# Patient Record
Sex: Female | Born: 1935 | Race: White | Hispanic: No | Smoking: Never smoker
Health system: Southern US, Community
[De-identification: ages and names within clinical notes are randomized; demographics above are authoritative.]

## PROBLEM LIST (undated history)

## (undated) DIAGNOSIS — R569 Unspecified convulsions: Secondary | ICD-10-CM

## (undated) DIAGNOSIS — J45909 Unspecified asthma, uncomplicated: Secondary | ICD-10-CM

## (undated) DIAGNOSIS — I639 Cerebral infarction, unspecified: Secondary | ICD-10-CM

## (undated) DIAGNOSIS — E119 Type 2 diabetes mellitus without complications: Secondary | ICD-10-CM

## (undated) HISTORY — PX: ABDOMINAL HYSTERECTOMY: SHX81

## (undated) HISTORY — PX: OTHER SURGICAL HISTORY: SHX169

## (undated) HISTORY — DX: Unspecified convulsions: R56.9

## (undated) HISTORY — PX: APPENDECTOMY: SHX54

## (undated) HISTORY — PX: TONSILLECTOMY: SUR1361

---

## 2017-06-26 DIAGNOSIS — I2699 Other pulmonary embolism without acute cor pulmonale: Secondary | ICD-10-CM

## 2017-06-26 HISTORY — DX: Other pulmonary embolism without acute cor pulmonale: I26.99

## 2017-07-26 DIAGNOSIS — N200 Calculus of kidney: Secondary | ICD-10-CM

## 2017-07-26 HISTORY — DX: Calculus of kidney: N20.0

## 2019-02-04 ENCOUNTER — Emergency Department (HOSPITAL_COMMUNITY): Payer: Medicare Other

## 2019-02-04 ENCOUNTER — Other Ambulatory Visit: Payer: Self-pay

## 2019-02-04 ENCOUNTER — Emergency Department (HOSPITAL_COMMUNITY)
Admission: EM | Admit: 2019-02-04 | Discharge: 2019-02-04 | Disposition: A | Discharge status: Home / Self Care | Payer: Medicare Other | Attending: Emergency Medicine | Admitting: Emergency Medicine

## 2019-02-04 ENCOUNTER — Encounter (HOSPITAL_COMMUNITY): Payer: Self-pay | Admitting: *Deleted

## 2019-02-04 DIAGNOSIS — Y929 Unspecified place or not applicable: Secondary | ICD-10-CM | POA: Insufficient documentation

## 2019-02-04 DIAGNOSIS — W0110XA Fall on same level from slipping, tripping and stumbling with subsequent striking against unspecified object, initial encounter: Secondary | ICD-10-CM | POA: Diagnosis not present

## 2019-02-04 DIAGNOSIS — R519 Headache, unspecified: Secondary | ICD-10-CM | POA: Insufficient documentation

## 2019-02-04 DIAGNOSIS — W19XXXA Unspecified fall, initial encounter: Secondary | ICD-10-CM

## 2019-02-04 DIAGNOSIS — E119 Type 2 diabetes mellitus without complications: Secondary | ICD-10-CM | POA: Diagnosis not present

## 2019-02-04 DIAGNOSIS — Y939 Activity, unspecified: Secondary | ICD-10-CM | POA: Diagnosis not present

## 2019-02-04 DIAGNOSIS — Y999 Unspecified external cause status: Secondary | ICD-10-CM | POA: Diagnosis not present

## 2019-02-04 DIAGNOSIS — J45909 Unspecified asthma, uncomplicated: Secondary | ICD-10-CM | POA: Insufficient documentation

## 2019-02-04 HISTORY — DX: Type 2 diabetes mellitus without complications: E11.9

## 2019-02-04 HISTORY — DX: Cerebral infarction, unspecified: I63.9

## 2019-02-04 HISTORY — DX: Unspecified asthma, uncomplicated: J45.909

## 2019-02-04 NOTE — ED Triage Notes (Addendum)
Pt fell this morning while sitting on toilet and hit head on the floor. Pt with hx of CVA and has balance issues. Pt with bruising to right eye and right forehead.

## 2019-02-04 NOTE — ED Provider Notes (Signed)
[facility] Provider Note   CSN: [id number] Arrival date & time: 02/04/19  1220     History Chief Complaint  Patient presents with  . Fall    Amberia Bayless is a 86 y.o. female.  Patient states that she tripped and fell and hit her head no loss of consciousness.  Patient is not taking any blood thinners.  Patient also hit her right knee but she says she has no pain there.  The history is provided by the patient. No language interpreter was used.  Fall This is a new problem. The current episode started 12 to 24 hours ago. The problem occurs rarely. The problem has been resolved. Pertinent negatives include no chest pain, no abdominal pain and no headaches. Nothing aggravates the symptoms. Nothing relieves the symptoms.       Past Medical History:  Diagnosis Date  . Asthma   . Diabetes mellitus without complication ([place])   . Stroke [facility])     There are no problems to display for this patient.   Past Surgical History:  Procedure Laterality Date  . ABDOMINAL HYSTERECTOMY    . APPENDECTOMY    . thumb surgery    . TONSILLECTOMY       OB History   No obstetric history on file.     History reviewed. No pertinent family history.  Social History   Tobacco Use  . Smoking status: Never Smoker  . Smokeless tobacco: Never Used  Substance Use Topics  . Alcohol use: Not Currently  . Drug use: Not Currently    Home Medications Prior to Admission medications   Not on File    Allergies    Asa [aspirin], Lisinopril, Molds & smuts, and Penicillins  Review of Systems   Review of Systems  Constitutional: Negative for appetite change and fatigue.  HENT: Negative for congestion, ear discharge and sinus pressure.        Headache  Eyes: Negative for discharge.  Respiratory: Negative for cough.   Cardiovascular: Negative for chest pain.  Gastrointestinal: Negative for abdominal pain and diarrhea.  Genitourinary: Negative for frequency and  hematuria.  Musculoskeletal: Negative for back pain.  Skin: Negative for rash.  Neurological: Negative for seizures and headaches.  Psychiatric/Behavioral: Negative for hallucinations.    Physical Exam Updated Vital Signs BP (!) 143/86   Pulse 71   Temp 98.5 F (36.9 C) (Oral)   Resp 18   Ht 5' 5.5" (1.664 m)   Wt 84.4 kg   SpO2 96%   BMI 30.48 kg/m   Physical Exam Vitals and nursing note reviewed.  Constitutional:      Appearance: She is well-developed.  HENT:     Head: Normocephalic.     Comments: Bruising around right orbit.    Nose: Nose normal.  Eyes:     General: No scleral icterus.    Conjunctiva/sclera: Conjunctivae normal.  Neck:     Thyroid: No thyromegaly.  Cardiovascular:     Rate and Rhythm: Normal rate and regular rhythm.     Heart sounds: No murmur. No friction rub. No gallop.   Pulmonary:     Breath sounds: No stridor. No wheezing or rales.  Chest:     Chest wall: No tenderness.  Abdominal:     General: There is no distension.     Tenderness: There is no abdominal tenderness. There is no rebound.  Musculoskeletal:        General: Normal range of motion.     Cervical  back: Neck supple.  Lymphadenopathy:     Cervical: No cervical adenopathy.  Skin:    Findings: No erythema or rash.  Neurological:     Mental Status: She is alert and oriented to person, place, and time.     Motor: No abnormal muscle tone.     Coordination: Coordination normal.  Psychiatric:        Behavior: Behavior normal.     ED Results / Procedures / Treatments   Labs (all labs ordered are listed, but only abnormal results are displayed) Labs Reviewed - No data to display  EKG None  Radiology CT Head Wo Contrast  Result Date: 02/04/2019 CLINICAL DATA:  Fall this morning with head injury on floor. EXAM: CT HEAD WITHOUT CONTRAST TECHNIQUE: Contiguous axial images were obtained from the base of the skull through the vertex without intravenous contrast. COMPARISON:   None. FINDINGS: Brain: Mild symmetric prominence of CSF space at the vertex with low attenuation, nonspecific, favor normal CSF due to cerebral atrophy. No evidence of parenchymal hemorrhage or definite extra-axial fluid collection. No mass lesion, mass effect, or midline shift. No CT evidence of acute infarction. Generalized cerebral volume loss. No ventriculomegaly. Vascular: No acute abnormality. Skull: No evidence of calvarial fracture. Large right frontal scalp hematoma. Sinuses/Orbits: The visualized paranasal sinuses are essentially clear. Other:  The mastoid air cells are unopacified. IMPRESSION: 1. Large right frontal scalp hematoma. No evidence of calvarial fracture. 2. No definite acute intracranial abnormality. Nonspecific mild symmetric low-attenuation prominence of the CSF space at the vertex, favor normal CSF due to cerebral atrophy, cannot entirely exclude small symmetric low-attenuation subdural collections/hygromas. Consider short-term follow-up head CT to document stability. Electronically Signed   By: Delbert Phenix M.D.   On: 02/04/2019 13:41    Procedures Procedures (including critical care time)  Medications Ordered in ED Medications - No data to display  ED Course  I have reviewed the triage vital signs and the nursing notes.  Pertinent labs & imaging results that were available during my care of the patient were reviewed by me and considered in my medical decision making (see chart for details).    MDM Rules/Calculators/A&P                      CT scan shows no fractures or bleeding inside head.  Patient with a large contusion to right forehead.  She will take Tylenol for pain and follow-up as needed Final Clinical Impression(s) / ED Diagnoses Final diagnoses:  Fall, initial encounter    Rx / DC Orders ED Discharge Orders    None       Bethann Berkshire, MD 02/04/19 1610

## 2019-02-04 NOTE — ED Notes (Signed)
Pt bruising and swelling to right eye/forehead worse.  Right eye swollen shut at this time.

## 2019-02-04 NOTE — Discharge Instructions (Signed)
Take tylenol for pain and follow with your md next week

## 2019-11-30 ENCOUNTER — Ambulatory Visit: Payer: Medicare Other | Admitting: Family Medicine

## 2019-12-27 ENCOUNTER — Other Ambulatory Visit: Payer: Self-pay

## 2019-12-27 ENCOUNTER — Ambulatory Visit (INDEPENDENT_AMBULATORY_CARE_PROVIDER_SITE_OTHER): Payer: Medicare Other | Admitting: Family Medicine

## 2019-12-27 ENCOUNTER — Encounter: Payer: Self-pay | Admitting: Family Medicine

## 2019-12-27 VITALS — BP 162/80 | HR 96 | Temp 97.2°F | Ht 66.0 in | Wt 202.0 lb

## 2019-12-27 DIAGNOSIS — I6930 Unspecified sequelae of cerebral infarction: Secondary | ICD-10-CM

## 2019-12-27 DIAGNOSIS — F320 Major depressive disorder, single episode, mild: Secondary | ICD-10-CM | POA: Diagnosis not present

## 2019-12-27 DIAGNOSIS — E1165 Type 2 diabetes mellitus with hyperglycemia: Secondary | ICD-10-CM

## 2019-12-27 DIAGNOSIS — B372 Candidiasis of skin and nail: Secondary | ICD-10-CM

## 2019-12-27 DIAGNOSIS — G40909 Epilepsy, unspecified, not intractable, without status epilepticus: Secondary | ICD-10-CM

## 2019-12-27 DIAGNOSIS — R413 Other amnesia: Secondary | ICD-10-CM

## 2019-12-27 DIAGNOSIS — I10 Essential (primary) hypertension: Secondary | ICD-10-CM

## 2019-12-27 DIAGNOSIS — N3281 Overactive bladder: Secondary | ICD-10-CM

## 2019-12-27 DIAGNOSIS — E785 Hyperlipidemia, unspecified: Secondary | ICD-10-CM

## 2019-12-27 MED ORDER — METFORMIN HCL 1000 MG PO TABS: 1000.0000 mg | ORAL_TABLET | Freq: Two times a day (BID) | ORAL | 1 refills | Status: DC

## 2019-12-27 MED ORDER — SOLIFENACIN SUCCINATE 5 MG PO TABS: 5.0000 mg | ORAL_TABLET | Freq: Every day | ORAL | 1 refills | Status: DC

## 2019-12-27 MED ORDER — SIMVASTATIN 5 MG PO TABS: 5.0000 mg | ORAL_TABLET | Freq: Every day | ORAL | 1 refills | Status: DC

## 2019-12-27 MED ORDER — LEVETIRACETAM 750 MG PO TABS: 750.0000 mg | ORAL_TABLET | Freq: Two times a day (BID) | ORAL | 1 refills | Status: DC

## 2019-12-27 MED ORDER — NYSTATIN 100000 UNIT/GM EX POWD: 1.0000 "application " | Freq: Three times a day (TID) | CUTANEOUS | 1 refills | Status: DC

## 2019-12-27 MED ORDER — AMLODIPINE BESYLATE 5 MG PO TABS: 5.0000 mg | ORAL_TABLET | Freq: Every day | ORAL | 1 refills | Status: DC

## 2019-12-27 MED ORDER — GABAPENTIN 300 MG PO CAPS: 300.0000 mg | ORAL_CAPSULE | Freq: Every day | ORAL | 1 refills | Status: DC

## 2019-12-27 MED ORDER — DULOXETINE HCL 20 MG PO CPEP: 20.0000 mg | ORAL_CAPSULE | Freq: Every day | ORAL | 1 refills | Status: DC

## 2019-12-27 MED ORDER — GLIPIZIDE 10 MG PO TABS: 10.0000 mg | ORAL_TABLET | Freq: Two times a day (BID) | ORAL | 1 refills | Status: DC

## 2019-12-27 NOTE — Patient Instructions (Addendum)
I appreciate the opportunity to provide you with care for your health and wellness. Today we discussed: establish care  Follow up: 3 months in office  No labs or referrals today  Sign release form for records  Nice to meet you both today.  Please continue to practice social distancing to keep you, your family, and our community safe.  If you must go out, please wear a mask and practice good handwashing.  It was a pleasure to see you and I look forward to continuing to work together on your health and well-being. Please do not hesitate to call the office if you need care or have questions about your care.  Have a wonderful day. With Gratitude, Tereasa Coop, DNP, AGNP-BC

## 2019-12-27 NOTE — Progress Notes (Signed)
Subjective:  Patient ID: Anna Robbins, female    DOB: 06/13/1932  Age: 86 y.o. MRN: [id number]  CC:  Chief Complaint  Patient presents with  . New Patient (Initial Visit)    rash underneath both breasts x1 week, needs med refills      HPI  HPI Anna Robbins is an 86 year old female patient who presents today to establish care.  She has an extensive history of recent which includes but is not limited to asthma, type 2 diabetes, seizures post having stroke, overactive bladder, depression, hypertension.  She presents today with her daughter-in-law who helps her with her care.  [place]ccre[place]Barbara [place] [facility].  After suffering a hemorrhagic stroke in October 2020.  She now lives with her daughter-in-law, her son and his wife and grandchildren.  They have a few pets in the home as well.  The downstairs main level has kind of been really renovated so that she has almost like a hotel like room and has all of the things that she needs.  She does report still feeling out of sorts not having everything that she is used to having.  But she is adjusting and she is happy here.  In [place] she was living with a son that she was very close with who ended up passing away.  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  Originally born in [place] a [place] age moved to [place] then back to [place] and then lived in [place] and then moved to [place] and then up to [place].  Her daughter-in-law has a written note that she presents prior to the appointment about concerning behavior that they seen over the last year since she is moved in.  Some of the things include forgetting things that were told to her that are very important that involve her within the same conversation.  Her getting things told her from the day before.  Forgetting plans that she makes and thinks that she decides either the same  day or next day.  Sometimes she will make up stories or situations that does happen.  Reinventing her passing significantly changing stories that are well-known to all her could not happen.  Sometimes there is general confusion.  Insisting that her version of events are the truth and gets angry when they tell her otherwise.  She gives great detail as to her back story.  In her life in [place].  Asthma: Limited inhaler use.  She has been doing okay since being in [place] as some of the trees she is not allergic to which is what she suffered with in [place] and in [place].  She does report having a little bit of a cough and reports that she has some scar tissue in her lungs secondary to holding her children on her side.  Type 2 diabetes: Has only been diabetic for around 20 years per her.  Last A1c check was 6.3%.  Is on glipizide and Metformin.  Is willing to have adjustment of this if A1c stays in this range at next check.  She denies having any signs or symptoms of hypo or hyperglycemia.  They can tell when her blood sugars are going lower as she does not feel well.  She is on Keppra for questionable seizure activity secondary to having her stroke.  They reported to her when she  was in the facility in [place] that she would probably be on it for couple weeks that sometimes this is not uncommon with brain changes.  When they took her off of it she continued to have seizure-like activity and so they put her back on it.  She has never been referred to a neurologist and is willing to have referral today.  She reports that she has an overactive bladder and is gotten worse since post stroke.  She also has loose stools since post her stroke.  Overall she does really well with this the medication helps.  She does wear pads to help.  She takes duloxetine secondary to developing depression after losing her son and suffering a stroke and moving.  She reports that she is feeling much better.  And knows that  her mind is getting into a place where she is comfortable living here now even though is taking a lot of time she is feeling better about the situation.  Blood pressure medicine she has been out of for the last several days.  Explaining why her blood pressure is elevated today.  Blood pressure control at home ranges from 1 20-1 40 over the 60s to 70s.  Refill medication requested today.  She has a rash that is up on her breast they know it is from moisture and is yeast related but they have not been able to get it to clear.  Would like to see if there is something that can be done about that.  She denies having any active chest pain but does report when asked if she has chest pain that she does get sternal discomfort secondary to scar tissue that developed when she was holding her children on the side constantly at 70 or 86 years old it crushed her lungs per her and she developed scar tissue so it can be seen on a scan when 1 is taken so she did not want anybody to think that she had active chest pain but she does answer yes to the chest pain question.  She denies having any excessive leg swelling, palpitations, cough outside of what was stated above, shortness of breath except for when walking for long distances which she has improved on secondary to physical therapy and continued walking.  She denies having any headaches or dizziness.  She uses her walker regularly when she is out of the house but is now started not having to needed as much when she is in the house.   Today patient denies signs and symptoms of COVID 19 infection including fever, chills, cough, shortness of breath, and headache. Past Medical, Surgical, Social History, Allergies, and Medications have been Reviewed.   Past Medical History:  Diagnosis Date  . Asthma   . Diabetes mellitus without complication (HCC)   . Kidney stones 07/2017   required sx  . Pulmonary emboli (HCC) 06/2017  . Seizures (HCC)   . Stroke [facility])      Current Meds  Medication Sig  . amLODipine (NORVASC) 5 MG tablet Take 1 tablet (5 mg total) by mouth daily.  . DULoxetine (CYMBALTA) 20 MG capsule Take 1 capsule (20 mg total) by mouth daily.  Marland Kitchen gabapentin (NEURONTIN) 300 MG capsule Take 1 capsule (300 mg total) by mouth at bedtime.  Marland Kitchen glipiZIDE (GLUCOTROL) 10 MG tablet Take 1 tablet (10 mg total) by mouth 2 (two) times daily.  Marland Kitchen levETIRAcetam (KEPPRA) 750 MG tablet Take 1 tablet (750 mg total) by mouth 2 (two) times daily.  Marland Kitchen  loperamide (IMODIUM) 2 MG capsule Take 2 mg by mouth daily.  . metFORMIN (GLUCOPHAGE) 1000 MG tablet Take 1 tablet (1,000 mg total) by mouth 2 (two) times daily with a meal.  . simvastatin (ZOCOR) 5 MG tablet Take 1 tablet (5 mg total) by mouth daily.  . solifenacin (VESICARE) 5 MG tablet Take 1 tablet (5 mg total) by mouth daily.  Marland Kitchen UNABLE TO FIND Take 1 tablet by mouth in the morning, at noon, and at bedtime. Med Name: Acetaminophen 1000mg   . UNABLE TO FIND Take 1 tablet by mouth as needed. Med Name: Oxycodone 5mg  (cut in half)  . UNABLE TO FIND Med Name: Albuterol inhaler as needed  . [DISCONTINUED] amLODipine (NORVASC) 5 MG tablet Take 5 mg by mouth daily.  . [DISCONTINUED] DULoxetine (CYMBALTA) 20 MG capsule Take 20 mg by mouth daily.  . [DISCONTINUED] gabapentin (NEURONTIN) 300 MG capsule Take 300 mg by mouth at bedtime.  . [DISCONTINUED] glipiZIDE (GLUCOTROL) 10 MG tablet Take 10 mg by mouth 2 (two) times daily.  . [DISCONTINUED] levETIRAcetam (KEPPRA) 750 MG tablet Take 750 mg by mouth 2 (two) times daily.  . [DISCONTINUED] metFORMIN (GLUCOPHAGE) 1000 MG tablet Take 1,000 mg by mouth 2 (two) times daily with a meal.  . [DISCONTINUED] simvastatin (ZOCOR) 5 MG tablet Take 5 mg by mouth daily.  . [DISCONTINUED] solifenacin (VESICARE) 5 MG tablet Take 5 mg by mouth daily.    ROS:  Review of Systems  Constitutional: Negative.   HENT: Negative.   Eyes: Negative.   Respiratory: Negative.   Cardiovascular:  Negative.   Gastrointestinal: Negative.   Genitourinary: Negative.   Musculoskeletal: Negative.   Skin: Positive for rash.  Endo/Heme/Allergies: Negative.   Psychiatric/Behavioral: Negative.      Objective:   Today's Vitals: BP (!) 162/80 (BP Location: Right Arm, Patient Position: Sitting, Cuff Size: Normal)   Pulse 96   Temp (!) 97.2 F (36.2 C) (Temporal)   Ht 5\' 6"  (1.676 m)   Wt 202 lb (91.6 kg)   SpO2 98%   BMI 32.60 kg/m  Vitals with BMI 12/27/2019 02/04/2019 02/04/2019  Height 5\' 6"  - -  Weight 202 lbs - -  BMI 32.62 - -  Systolic 162 143 14/01/2019  Diastolic 80 86 71  Pulse 96 71 86     Physical Exam Vitals and nursing note reviewed.  Constitutional:      Appearance: Normal appearance. She is well-developed and well-groomed. She is obese.  HENT:     Head: Normocephalic and atraumatic.     Right Ear: External ear normal.     Left Ear: External ear normal.     Mouth/Throat:     Comments: Mask in place  Eyes:     General:        Right eye: No discharge.        Left eye: No discharge.     Conjunctiva/sclera: Conjunctivae normal.  Cardiovascular:     Rate and Rhythm: Normal rate and regular rhythm.     Pulses: Normal pulses.     Heart sounds: Normal heart sounds.  Pulmonary:     Effort: Pulmonary effort is normal.     Breath sounds: Normal breath sounds.  Musculoskeletal:        General: Normal range of motion.     Cervical back: Normal range of motion and neck supple.     Comments: 04/04/2019 present  Skin:    General: Skin is warm.     Comments: Yeast rash under  breast   Neurological:     General: No focal deficit present.     Mental Status: She is alert and oriented to person, place, and time.  Psychiatric:        Attention and Perception: Attention and perception normal.        Mood and Affect: Mood and affect normal.        Speech: Speech normal.        Behavior: Behavior normal. Behavior is cooperative.        Thought Content: Thought content normal.         Judgment: Judgment normal.    Depression screen [facility] 2/9 12/27/2019 12/27/2019  Decreased Interest 1 1  Down, Depressed, Hopeless 1 1  PHQ - 2 Score 2 2  Altered sleeping 1 1  Tired, decreased energy 1 1  Change in appetite 0 0  Feeling bad or failure about yourself  1 1  Trouble concentrating 1 1  Moving slowly or fidgety/restless 0 0  Suicidal thoughts 0 0  PHQ-9 Score 6 6  Difficult doing work/chores Not difficult at all Not difficult at all     Assessment   1. Depression, major, single episode, mild (HCC)   2. Type 2 diabetes mellitus with hyperglycemia, without long-term current use of insulin (HCC)   3. Seizure disorder (HCC)   4. History of CVA with residual deficit   5. Essential hypertension   6. Hyperlipidemia, unspecified hyperlipidemia type   7. Yeast dermatitis   8. Overactive bladder   9. Memory changes     Tests ordered Orders Placed This Encounter  Procedures  . Ambulatory referral to Neurology     Plan: Please see assessment and plan per problem list above.   Meds ordered this encounter  Medications  . nystatin (MYCOSTATIN/NYSTOP) powder    Sig: Apply 1 application topically 3 (three) times daily.    Dispense:  45 g    Refill:  1    Order Specific Question:   Supervising Provider    Answer:   SIMPSON, MARGARET E [[id number]]  . amLODipine (NORVASC) 5 MG tablet    Sig: Take 1 tablet (5 mg total) by mouth daily.    Dispense:  90 tablet    Refill:  1    Order Specific Question:   Supervising Provider    Answer:   SIMPSON, MARGARET E [[id number]]  . DULoxetine (CYMBALTA) 20 MG capsule    Sig: Take 1 capsule (20 mg total) by mouth daily.    Dispense:  90 capsule    Refill:  1    Order Specific Question:   Supervising Provider    Answer:   SIMPSON, MARGARET E [[id number]]  . gabapentin (NEURONTIN) 300 MG capsule    Sig: Take 1 capsule (300 mg total) by mouth at bedtime.    Dispense:  90 capsule    Refill:  1    Order Specific Question:   Supervising Provider     Answer:   SIMPSON, MARGARET E [[id number]]  . glipiZIDE (GLUCOTROL) 10 MG tablet    Sig: Take 1 tablet (10 mg total) by mouth 2 (two) times daily.    Dispense:  90 tablet    Refill:  1    Order Specific Question:   Supervising Provider    Answer:   SIMPSON, MARGARET E [[id number]]  . levETIRAcetam (KEPPRA) 750 MG tablet    Sig: Take 1 tablet (750 mg total) by mouth 2 (two) times daily.    Dispense:  180 tablet    Refill:  1    Order Specific Question:   Supervising Provider    Answer:   SIMPSON, MARGARET E [[id number]]  . metFORMIN (GLUCOPHAGE) 1000 MG tablet    Sig: Take 1 tablet (1,000 mg total) by mouth 2 (two) times daily with a meal.    Dispense:  180 tablet    Refill:  1    Order Specific Question:   Supervising Provider    Answer:   SIMPSON, MARGARET E [[id number]]  . simvastatin (ZOCOR) 5 MG tablet    Sig: Take 1 tablet (5 mg total) by mouth daily.    Dispense:  90 tablet    Refill:  1    Order Specific Question:   Supervising Provider    Answer:   SIMPSON, MARGARET E [[id number]]  . solifenacin (VESICARE) 5 MG tablet    Sig: Take 1 tablet (5 mg total) by mouth daily.    Dispense:  90 tablet    Refill:  1    Order Specific Question:   Supervising Provider    Answer:   Genia Harold    Patient to follow-up in 03/27/2020  Note: This dictation was prepared with Dragon dictation along with smaller phrase technology. Similar sounding words can be transcribed inadequately or may not be corrected upon review. Any transcriptional errors that result from this process are unintentional.      Freddy Finner, NP

## 2019-12-28 ENCOUNTER — Encounter: Payer: Self-pay | Admitting: Family Medicine

## 2019-12-28 DIAGNOSIS — R413 Other amnesia: Secondary | ICD-10-CM | POA: Insufficient documentation

## 2019-12-28 DIAGNOSIS — B372 Candidiasis of skin and nail: Secondary | ICD-10-CM | POA: Insufficient documentation

## 2019-12-28 DIAGNOSIS — G40909 Epilepsy, unspecified, not intractable, without status epilepticus: Secondary | ICD-10-CM | POA: Insufficient documentation

## 2019-12-28 DIAGNOSIS — I6930 Unspecified sequelae of cerebral infarction: Secondary | ICD-10-CM | POA: Insufficient documentation

## 2019-12-28 DIAGNOSIS — F320 Major depressive disorder, single episode, mild: Secondary | ICD-10-CM | POA: Insufficient documentation

## 2019-12-28 DIAGNOSIS — I10 Essential (primary) hypertension: Secondary | ICD-10-CM | POA: Insufficient documentation

## 2019-12-28 DIAGNOSIS — E785 Hyperlipidemia, unspecified: Secondary | ICD-10-CM | POA: Insufficient documentation

## 2019-12-28 DIAGNOSIS — N3281 Overactive bladder: Secondary | ICD-10-CM | POA: Insufficient documentation

## 2019-12-28 DIAGNOSIS — E1165 Type 2 diabetes mellitus with hyperglycemia: Secondary | ICD-10-CM | POA: Insufficient documentation

## 2019-12-28 NOTE — Assessment & Plan Note (Signed)
PHQ of 6.  Denies having any SI or HI.  Continue Cymbalta at this time. I do feel there is probably a better medication for this.  We will refrain from changing anything at this time.

## 2019-12-28 NOTE — Assessment & Plan Note (Signed)
Nystatin powder provided.  Education on how to keep moisture down.  New bra might be of benefit.  If not improved will do cream with steroid.

## 2019-12-28 NOTE — Assessment & Plan Note (Addendum)
Not well controlled at this time.  Has been out of her medication.  We will do refill today.  In close follow-up advised for them to continue to check her blood pressure at home.  She is encouraged a DASH diet and to be as active as much as she can safely. Patient acknowledged agreement and understanding of the plan.   Of note history of having cough secondary to ACE inhibitor

## 2019-12-28 NOTE — Assessment & Plan Note (Signed)
Continue Zocor at this time heart healthy diet encouraged.  Updated labs will be next appointment

## 2019-12-28 NOTE — Assessment & Plan Note (Signed)
Referral to neurology as she is never followed up with neurology per her.  Appropriate assessment of residual side effects is needed.  Patient aware and on board with plan.

## 2019-12-28 NOTE — Assessment & Plan Note (Signed)
Anna Robbins is encouraged to check blood sugar daily as directed. Continue current medications. Is on statin as well, restarting blood pressure medicine. Last A1c was well controlled almost slightly overcorrected given her age I think it would be a good idea to look at dropping glipizide if kidney function is stable for Metformin use continued.  Will check at next visit. Educated on importance of maintain a well balanced diabetic friendly diet.  She is reminded the importance of maintaining  good blood sugars,  taking medications as directed, daily foot care, annual eye exams. Additionally educated about keeping good control over blood pressure and cholesterol as well.

## 2019-12-28 NOTE — Assessment & Plan Note (Signed)
Continue the use of Vesicare however do worry about the side effects of this given her age.  We will revisit this in future.

## 2019-12-28 NOTE — Assessment & Plan Note (Signed)
Referral to neurology to assess whether or not use of Keppra is needed.

## 2019-12-28 NOTE — Assessment & Plan Note (Signed)
Family is very concerned about some memory changes is this post stroke I am unsure of we will find out with a neurologist thinks could be possibly just memory changes in general given age or possible history of dementia and other family members or herself.  She reports never having high blood pressure however her blood pressure is extremely high now post stroke I do wonder if she did have high blood pressure that was not treated.  Given that she could have vascular dementia changes.

## 2019-12-29 NOTE — Telephone Encounter (Signed)
Please do a letter stating she no longer needs the oxygen. Thank you

## 2020-02-20 ENCOUNTER — Ambulatory Visit: Payer: Medicare Other | Admitting: Neurology

## 2020-02-20 ENCOUNTER — Encounter: Payer: Self-pay | Admitting: Neurology

## 2020-02-20 ENCOUNTER — Telehealth: Payer: Self-pay | Admitting: Neurology

## 2020-02-20 ENCOUNTER — Other Ambulatory Visit: Payer: Self-pay

## 2020-02-20 VITALS — BP 154/73 | HR 79 | Ht 66.0 in | Wt 208.4 lb

## 2020-02-20 DIAGNOSIS — I619 Nontraumatic intracerebral hemorrhage, unspecified: Secondary | ICD-10-CM | POA: Diagnosis not present

## 2020-02-20 DIAGNOSIS — G40009 Localization-related (focal) (partial) idiopathic epilepsy and epileptic syndromes with seizures of localized onset, not intractable, without status epilepticus: Secondary | ICD-10-CM | POA: Diagnosis not present

## 2020-02-20 DIAGNOSIS — M5412 Radiculopathy, cervical region: Secondary | ICD-10-CM | POA: Diagnosis not present

## 2020-02-20 MED ORDER — ASPIRIN EC 81 MG PO TBEC: 81.0000 mg | DELAYED_RELEASE_TABLET | Freq: Every day | ORAL | 11 refills | Status: DC

## 2020-02-20 NOTE — Patient Instructions (Addendum)
I had a long discussion with the patient and her friend regarding her hemorrhagic stroke, post stroke seizures as well as right hand paresthesias from cervical radiculopathy and answered questions.  Recommend she start taking aspirin 81 mg daily for secondary stroke prevention and maintain aggressive control of risk factors with strict control of hypertension with blood pressure goal below 130/90, lipids with LDL cholesterol goal below 70 mg percent and diabetes with hemoglobin A1c goal below 6.5%.  Check lipid profile and hemoglobin A1c today.  Check MRI scan of the brain with and without contrast to look for interval new strokes as well as MRI scan of the cervical spine to look for compressive radiculopathy.  Continue Keppra for seizures and the current dose of 750 twice daily but if EEG is quite sent may consider reducing the dose.  She was advised to use her Rollator at all times and we discussed fall safety precautions.  She will return for follow-up in 3 months or call earlier if necessary.  Stroke Prevention Some medical conditions and behaviors are associated with a higher chance of having a stroke. You can help prevent a stroke by making nutrition, lifestyle, and other changes, including managing any medical conditions you may have. What nutrition changes can be made?  Eat healthy foods. You can do this by: ? Choosing foods high in fiber, such as fresh fruits and vegetables and whole grains. ? Eating at least 5 or more servings of fruits and vegetables a day. Try to fill half of your plate at each meal with fruits and vegetables. ? Choosing lean protein foods, such as lean cuts of meat, poultry without skin, fish, tofu, beans, and nuts. ? Eating low-fat dairy products. ? Avoiding foods that are high in salt (sodium). This can help lower blood pressure. ? Avoiding foods that have saturated fat, trans fat, and cholesterol. This can help prevent high cholesterol. ? Avoiding processed and premade  foods.  Follow your health care provider's specific guidelines for losing weight, controlling high blood pressure (hypertension), lowering high cholesterol, and managing diabetes. These may include: ? Reducing your daily calorie intake. ? Limiting your daily sodium intake to 1,500 milligrams (mg). ? Using only healthy fats for cooking, such as olive oil, canola oil, or sunflower oil. ? Counting your daily carbohydrate intake.   What lifestyle changes can be made?  Maintain a healthy weight. Talk to your health care provider about your ideal weight.  Get at least 30 minutes of moderate physical activity at least 5 days a week. Moderate activity includes brisk walking, biking, and swimming.  Do not use any products that contain nicotine or tobacco, such as cigarettes and e-cigarettes. If you need help quitting, ask your health care provider. It may also be helpful to avoid exposure to secondhand smoke.  Limit alcohol intake to no more than 1 drink a day for nonpregnant women and 2 drinks a day for men. One drink equals 12 oz of beer, 5 oz of wine, or 1 oz of hard liquor.  Stop any illegal drug use.  Avoid taking birth control pills. Talk to your health care provider about the risks of taking birth control pills if: ? You are over 30 years old. ? You smoke. ? You get migraines. ? You have ever had a blood clot. What other changes can be made?  Manage your cholesterol levels. ? Eating a healthy diet is important for preventing high cholesterol. If cholesterol cannot be managed through diet alone, you may also  need to take medicines. ? Take any prescribed medicines to control your cholesterol as told by your health care provider.  Manage your diabetes. ? Eating a healthy diet and exercising regularly are important parts of managing your blood sugar. If your blood sugar cannot be managed through diet and exercise, you may need to take medicines. ? Take any prescribed medicines to control  your diabetes as told by your health care provider.  Control your hypertension. ? To reduce your risk of stroke, try to keep your blood pressure below 130/80. ? Eating a healthy diet and exercising regularly are an important part of controlling your blood pressure. If your blood pressure cannot be managed through diet and exercise, you may need to take medicines. ? Take any prescribed medicines to control hypertension as told by your health care provider. ? Ask your health care provider if you should monitor your blood pressure at home. ? Have your blood pressure checked every year, even if your blood pressure is normal. Blood pressure increases with age and some medical conditions.  Get evaluated for sleep disorders (sleep apnea). Talk to your health care provider about getting a sleep evaluation if you snore a lot or have excessive sleepiness.  Take over-the-counter and prescription medicines only as told by your health care provider. Aspirin or blood thinners (antiplatelets or anticoagulants) may be recommended to reduce your risk of forming blood clots that can lead to stroke.  Make sure that any other medical conditions you have, such as atrial fibrillation or atherosclerosis, are managed. What are the warning signs of a stroke? The warning signs of a stroke can be easily remembered as BEFAST.  B is for balance. Signs include: ? Dizziness. ? Loss of balance or coordination. ? Sudden trouble walking.  E is for eyes. Signs include: ? A sudden change in vision. ? Trouble seeing.  F is for face. Signs include: ? Sudden weakness or numbness of the face. ? The face or eyelid drooping to one side.  A is for arms. Signs include: ? Sudden weakness or numbness of the arm, usually on one side of the body.  S is for speech. Signs include: ? Trouble speaking (aphasia). ? Trouble understanding.  T is for time. ? These symptoms may represent a serious problem that is an emergency. Do not  wait to see if the symptoms will go away. Get medical help right away. Call your local emergency services (911 in the [place] Do not drive yourself to the hospital.  Other signs of stroke may include: ? A sudden, severe headache with no known cause. ? Nausea or vomiting. ? Seizure. Where to find more information For more information, visit:  American Stroke Association: www.strokeassociation.org  National Stroke Association: www.stroke.org Summary  You can prevent a stroke by eating healthy, exercising, not smoking, limiting alcohol intake, and managing any medical conditions you may have.  Do not use any products that contain nicotine or tobacco, such as cigarettes and e-cigarettes. If you need help quitting, ask your health care provider. It may also be helpful to avoid exposure to secondhand smoke.  Remember BEFAST for warning signs of stroke. Get help right away if you or a loved one has any of these signs. This information is not intended to replace advice given to you by your health care provider. Make sure you discuss any questions you have with your health care provider. Document Revised: 12/25/2016 Document Reviewed: 02/18/2016 Elsevier Patient Education  2021 [employer].

## 2020-02-20 NOTE — Telephone Encounter (Signed)
[employer] medicare order sent to [facility]. No auth they will reach out to the patient to schedule.  

## 2020-02-20 NOTE — Progress Notes (Signed)
[facility] [address] [place]. [place] [address] [phone]       OFFICE CONSULT NOTE  [name] Date of Birth:  [birth year] Medical Record Number:  [id number]   Referring MD:  Tereasa Coop, FNP  Reason for Referral:   stroke  HPI: Anna Robbins is a 87 year old pleasant Caucasian lady seen today for initial office consultation visit.  She is accompanied by a friend Garment/textile technologist.  History is obtained from them, review of referral notes and was unable to personally review imaging films or reports in PACS.  She has past medical history of diabetes, hypertension, hyperlipidemia and pulmonary emboli.  She was on long-term anticoagulation but is unable to give me details.  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  She was admitted to [facility] in [place].  Unfortunately I do not have actual records from there for review today she states she had some right-sided weakness and diminished fine motor skills and was off balance and barely able to walk.  She was transferred to inpatient rehab facility for 5 weeks as she gradually got better.  This stroke happened a few weeks after her son died suddenly.  Patient was apparently on anticoagulation for pulmonary embolism which was stopped after this episode of hemorrhagic stroke.  She patient was subsequently moved to [place] to be close to her other son.  She is currently moving with her son and his in-laws as she is no longer able to live alone.  She also had apparently seizures following her hemorrhagic stroke and was started on Keppra.  She was supposed to be on it only for short time and when she tried to wean herself off it she had tremulousness and jerking on the right side and Keppra was resumed thinking the episodes were seizures.  Patient with has also been complaining of numbness and tingling in the right hand particularly the left fingers for quite some time.   She feels this may have gotten worse after her stroke.  She does complain of stiffness in the neck and restriction of neck movements to the right.  She has never been evaluated with MRI of the neck to see if she has radiculopathy or spinal stenosis.  ROS:   14 system review of systems is positive for weight gain, fatigue, blurred vision, anemia, bruising, feeling cold, cough, snoring, leg swelling, ringing in the ears, trouble swallowing, incontinence, diarrhea, rash, itching, joint pain, allergies, runny nose, confusion, insomnia, restless legs, difficulty swallowing, dizziness, seizure, anxiety and depression, decreased energy and too much sleep.  PMH:  Past Medical History:  Diagnosis Date  . Asthma   . Diabetes mellitus without complication (HCC)   . Kidney stones 07/2017   required sx  . Pulmonary emboli (HCC) 06/2017  . Seizures (HCC)   . Stroke [facility])     Social History:  Social History   Socioeconomic History  . Marital status: Widowed    Spouse name: Not on file  . Number of children: Not on file  . Years of education: Not on file  . Highest education level: Not on file  Occupational History  . Not on file  Tobacco Use  . Smoking status: Never Smoker  . Smokeless tobacco: Never Used  Substance and Sexual Activity  . Alcohol use: Not Currently  . Drug use: Not Currently  . Sexual activity: Not Currently  Other Topics Concern  . Not on file  Social History Narrative  Lives with friend Anna Robbins, son Anna Robbins and daughter in Social worker and 2 grandsons   Social Determinants of Health   Financial Resource Strain: Not on file  Food Insecurity: Not on file  Transportation Needs: Not on file  Physical Activity: Not on file  Stress: Not on file  Social Connections: Not on file  Intimate Partner Violence: Not At Risk  . Fear of Current or Ex-Partner: No  . Emotionally Abused: No  . Physically Abused: No  . Sexually Abused: No    Medications:   Current Outpatient Medications on  File Prior to Visit  Medication Sig Dispense Refill  . acetaminophen (TYLENOL) 500 MG tablet Take 500-1,000 mg by mouth every 6 (six) hours as needed.    Marland Kitchen albuterol (VENTOLIN HFA) 108 (90 Base) MCG/ACT inhaler Inhale into the lungs every 6 (six) hours as needed for wheezing or shortness of breath.    Marland Kitchen amLODipine (NORVASC) 5 MG tablet Take 1 tablet (5 mg total) by mouth daily. 90 tablet 1  . DULoxetine (CYMBALTA) 20 MG capsule Take 1 capsule (20 mg total) by mouth daily. 90 capsule 1  . gabapentin (NEURONTIN) 300 MG capsule Take 1 capsule (300 mg total) by mouth at bedtime. 90 capsule 1  . glipiZIDE (GLUCOTROL) 10 MG tablet Take 1 tablet (10 mg total) by mouth 2 (two) times daily. 90 tablet 1  . levETIRAcetam (KEPPRA) 750 MG tablet Take 1 tablet (750 mg total) by mouth 2 (two) times daily. 180 tablet 1  . loperamide (IMODIUM) 2 MG capsule Take 2 mg by mouth daily.    . metFORMIN (GLUCOPHAGE) 1000 MG tablet Take 1 tablet (1,000 mg total) by mouth 2 (two) times daily with a meal. 180 tablet 1  . nystatin (MYCOSTATIN/NYSTOP) powder Apply 1 application topically 3 (three) times daily. 45 g 1  . oxycodone (OXY-IR) 5 MG capsule Take 5 mg by mouth every 4 (four) hours as needed.    . simvastatin (ZOCOR) 5 MG tablet Take 1 tablet (5 mg total) by mouth daily. 90 tablet 1  . solifenacin (VESICARE) 5 MG tablet Take 1 tablet (5 mg total) by mouth daily. 90 tablet 1  . UNABLE TO FIND Take 1 tablet by mouth in the morning, at noon, and at bedtime. Med Name: Acetaminophen 1000mg     . UNABLE TO FIND Take 1 tablet by mouth as needed. Med Name: Oxycodone 5mg  (cut in half)    . UNABLE TO FIND Med Name: Albuterol inhaler as needed     No current facility-administered medications on file prior to visit.    Allergies:   Allergies  Allergen Reactions  . Asa [Aspirin]   . Lisinopril     cough  . Molds & Smuts   . Penicillins     Physical Exam General: well developed, well nourished elderly Caucasian lady,  seated, in no evident distress Head: head normocephalic and atraumatic.   Neck: supple with no carotid or supraclavicular bruits Cardiovascular: regular rate and rhythm, no murmurs Musculoskeletal: Mild kyphoscoliosis Skin:  no rash/petichiae Vascular:  Normal pulses all extremities  Neurologic Exam Mental Status: Awake and fully alert. Oriented to place and time. Recent and remote memory intact. Attention span, concentration and fund of knowledge appropriate. Mood and affect appropriate.  Cranial Nerves: Fundoscopic exam reveals sharp disc margins. Pupils equal, briskly reactive to light. Extraocular movements full without nystagmus. Visual fields full to confrontation. Hearing diminished bilaterally. Facial sensation intact. Face, tongue, palate moves normally and symmetrically.  Motor: Normal bulk and  tone. Normal strength in all tested extremity muscles.  Mild weakness of intrinsic hand muscles in the right hand particularly over the ulnar fingers Sensory.: intact to touch , pinprick , position and vibratory sensation.  Except slight hyperesthesia over the tips of the ulnar mediated fingers in the right hand. Coordination: Rapid alternating movements normal in all extremities. Finger-to-nose and heel-to-shin performed accurately bilaterally. Gait and Station: Arises from chair with difficulty. Stance is stooped.. Uses a Rollator. Reflexes: 1+ and symmetric. Toes downgoing.   NIHSS 0 Modified Rankin  2   ASSESSMENT: 87 year old Caucasian lady with hemorrhagic stroke in October 2020 possibly hypertensive versus anticoagulation related  followed by symptomatic seizures.  Vascular risk factors of diabetes, hypertension, hyperlipidemia.  Chronic right hand paresthesias likely from C8-T1 cervical radiculopathy.     PLAN: I had a long discussion with the patient and her friend regarding her hemorrhagic stroke, post stroke seizures as well as right hand paresthesias from cervical radiculopathy  and answered questions.  Recommend she start taking aspirin 81 mg daily for secondary stroke prevention and maintain aggressive control of risk factors with strict control of hypertension with blood pressure goal below 130/90, lipids with LDL cholesterol goal below 70 mg percent and diabetes with hemoglobin A1c goal below 6.5%.  Check lipid profile and hemoglobin A1c today.  Check MRI scan of the brain with and without contrast to look for interval new strokes as well as MRI scan of the cervical spine to look for compressive radiculopathy.  Continue Keppra for seizures and the current dose of 750 twice daily but if EEG is quite sent may consider reducing the dose.  She was advised to use her Rollator at all times and we discussed fall safety precautions.  She will return for follow-up in 3 months or call earlier if necessary.  Greater than 50% time during this 45-minute consultation was it was spent on counseling and coordination of care about her hemorrhagic stroke and post stroke seizures and answering questions. Delia Heady, MD  [facility] [address] [place], [place] [address]  Phone [phone] Fax [phone] Note: This document was prepared with digital dictation and possible smart phrase technology. Any transcriptional errors that result from this process are unintentional.

## 2020-02-21 ENCOUNTER — Telehealth: Payer: Self-pay | Admitting: *Deleted

## 2020-02-21 ENCOUNTER — Telehealth: Payer: Self-pay | Admitting: Neurology

## 2020-02-21 LAB — LIPID PANEL
Chol/HDL Ratio: 3 ratio (ref 0.0–4.4)
Cholesterol, Total: 150 mg/dL (ref 100–199)
HDL: 50 mg/dL (ref >39)
LDL Chol Calc (NIH): 75 mg/dL (ref 0–99)
Triglycerides: 144 mg/dL (ref 0–149)
VLDL Cholesterol Cal: 25 mg/dL (ref 5–40)

## 2020-02-21 LAB — HEMOGLOBIN A1C
Est. average glucose Bld gHb Est-mCnc: 140 mg/dL
Hgb A1c MFr Bld: 6.5 % — ABNORMAL HIGH (ref 4.8–5.6)

## 2020-02-21 NOTE — Progress Notes (Signed)
Kindly advise the patient that both cholesterol profile and screening test for diabetes was borderline but   acceptable

## 2020-02-21 NOTE — Telephone Encounter (Signed)
Pt.'s housemate [name] is on DPR. She is asking if doctor can rewrite order for MRI to be done in [place]. Please advise.

## 2020-02-21 NOTE — Telephone Encounter (Signed)
Spoke with patient and advised pt of her lab results. She verbalized understanding and appreciation.

## 2020-02-22 ENCOUNTER — Other Ambulatory Visit: Payer: Self-pay | Admitting: Neurology

## 2020-02-22 DIAGNOSIS — R270 Ataxia, unspecified: Secondary | ICD-10-CM

## 2020-02-22 NOTE — Telephone Encounter (Signed)
When you get a chance can you put two new MRI orders in for [facility]. Thank you!!

## 2020-02-22 NOTE — Telephone Encounter (Signed)
Patient is scheduled at [facility] for 03/06/20 to arrive at 3:30 pm. Patient is aware of time and day. I also gave their number of [phone] incase she needed to r/s.

## 2020-02-28 ENCOUNTER — Other Ambulatory Visit: Payer: Medicare Other

## 2020-03-04 ENCOUNTER — Other Ambulatory Visit: Payer: Self-pay

## 2020-03-04 ENCOUNTER — Ambulatory Visit: Payer: Medicare Other | Admitting: Neurology

## 2020-03-04 DIAGNOSIS — G40009 Localization-related (focal) (partial) idiopathic epilepsy and epileptic syndromes with seizures of localized onset, not intractable, without status epilepticus: Secondary | ICD-10-CM | POA: Diagnosis not present

## 2020-03-05 NOTE — Progress Notes (Signed)
Kindly inform the patient that EEG study was normal

## 2020-03-06 ENCOUNTER — Other Ambulatory Visit: Payer: Self-pay

## 2020-03-06 ENCOUNTER — Ambulatory Visit (HOSPITAL_COMMUNITY)
Admission: RE | Admit: 2020-03-06 | Discharge: 2020-03-06 | Disposition: A | Discharge status: Home / Self Care | Payer: Medicare Other | Source: Ambulatory Visit | Attending: Neurology | Admitting: Neurology

## 2020-03-06 DIAGNOSIS — R270 Ataxia, unspecified: Secondary | ICD-10-CM | POA: Insufficient documentation

## 2020-03-06 MED ORDER — GADOBUTROL 1 MMOL/ML IV SOLN
10.0000 mL | Freq: Once | INTRAVENOUS | Status: AC | PRN
  Administered 2020-03-06: 10 mL via INTRAVENOUS

## 2020-03-07 ENCOUNTER — Other Ambulatory Visit: Payer: Medicare Other

## 2020-03-07 ENCOUNTER — Encounter: Payer: Self-pay | Admitting: *Deleted

## 2020-03-12 NOTE — Progress Notes (Signed)
Kindly inform the patient that MRI scan of the brain showed no new or worrisome finding.  Old small stroke is noted on the top on the right.  Nothing to worry about.

## 2020-03-12 NOTE — Progress Notes (Signed)
Kindly inform the patient that MRI scan of the cervical spine shows evidence of mild spinal stenosis between the fifth and sixth vertebrae and narrowing of the bony openings through which the nerves come out between the fourth and fifth of the fifth and sixth vertebrae on the left.  These are likely due to age-related changes of wear and tear.  This should be managed medically at this time.

## 2020-03-20 ENCOUNTER — Telehealth: Payer: Self-pay | Admitting: *Deleted

## 2020-03-20 NOTE — Telephone Encounter (Signed)
-----   Message from [name], MD sent at 03/12/2020  4:28 PM EST ----- Kindly inform the patient that MRI scan of the cervical spine shows evidence of mild spinal stenosis between the fifth and sixth vertebrae and narrowing of the bony openings through which the nerves come out between the fourth and fifth of the fifth and sixth vertebrae on the left.  These are likely due to age-related changes of wear and tear.  This should be managed medically at this time.

## 2020-03-20 NOTE — Telephone Encounter (Addendum)
I spoke to her son on Anna Robbins (on [place]) and provided him with the patient's results. He will relay the message and have her call back with any questions. She has a pending appt with Dr. Pearlean Brownie on 06/10/20.

## 2020-03-20 NOTE — Telephone Encounter (Signed)
-----   Message from [name], MD sent at 03/12/2020  4:23 PM EST ----- Kindly inform the patient that MRI scan of the brain showed no new or worrisome finding.  Old small stroke is noted on the top on the right.  Nothing to worry about.

## 2020-03-20 NOTE — Telephone Encounter (Signed)
I spoke to her son on Joel (on DPR) and provided him with the patient's results. He will relay the message and have her call back with any questions. She has a pending appt with Dr. Sethi on 06/10/20.  

## 2020-03-25 ENCOUNTER — Other Ambulatory Visit: Payer: Self-pay

## 2020-03-25 ENCOUNTER — Encounter: Payer: Self-pay | Admitting: Internal Medicine

## 2020-03-25 ENCOUNTER — Ambulatory Visit (INDEPENDENT_AMBULATORY_CARE_PROVIDER_SITE_OTHER): Payer: Medicare Other | Admitting: Internal Medicine

## 2020-03-25 VITALS — BP 157/77 | HR 68 | Ht 66.0 in | Wt 211.0 lb

## 2020-03-25 DIAGNOSIS — R5381 Other malaise: Secondary | ICD-10-CM

## 2020-03-25 DIAGNOSIS — N3281 Overactive bladder: Secondary | ICD-10-CM

## 2020-03-25 DIAGNOSIS — L304 Erythema intertrigo: Secondary | ICD-10-CM | POA: Diagnosis not present

## 2020-03-25 DIAGNOSIS — E1165 Type 2 diabetes mellitus with hyperglycemia: Secondary | ICD-10-CM

## 2020-03-25 MED ORDER — GLIPIZIDE 5 MG PO TABS: 5.0000 mg | ORAL_TABLET | Freq: Two times a day (BID) | ORAL | 2 refills | Status: DC

## 2020-03-25 MED ORDER — KETOCONAZOLE-HYDROCORTISONE 2-2.5 % EX CREA: 1.0000 "application " | TOPICAL_CREAM | Freq: Every day | CUTANEOUS | 0 refills | Status: DC

## 2020-03-25 MED ORDER — GEMTESA 75 MG PO TABS: 1.0000 | ORAL_TABLET | Freq: Every day | ORAL | 2 refills | Status: DC

## 2020-03-25 NOTE — Progress Notes (Signed)
Acute Office Visit  Subjective:    Patient ID: Anna Robbins, female    DOB: 03/22/1932, 87 y.o.   MRN: [id number]  Chief Complaint  Patient presents with  . Rash    Under breasts. Has used gold bond powder. Itches and burns     HPI Patient is in today for evaluation of rash underneath both breasts, which have been present for months. It has been itching and getting worse. She tried Nystatin powder with no relief.  Her family member is present during the visit and mentions that they have been concerned with her memory and she has been more adamant about her habits and does not follow instructions. She has mixed up medicines at least once. She has had balance issues since stroke and still takes bath sometimes without anyone knowing about it. She has been more upset and angry at times as well.  Her HbA1C was 6.5, and she is still taking Metformin and Glipizide. She has h/o urinary urgency, for which she take Vesicare.  Past Medical History:  Diagnosis Date  . Asthma   . Diabetes mellitus without complication ([place])   . Kidney stones 07/2017   required sx  . Pulmonary emboli ([place]) 06/2017  . Seizures ([place])   . Stroke [facility])     Past Surgical History:  Procedure Laterality Date  . ABDOMINAL HYSTERECTOMY    . APPENDECTOMY    . thumb surgery    . TONSILLECTOMY      History reviewed. No pertinent family history.  Social History   Socioeconomic History  . Marital status: Widowed    Spouse name: Not on file  . Number of children: Not on file  . Years of education: Not on file  . Highest education level: Not on file  Occupational History  . Not on file  Tobacco Use  . Smoking status: Never Smoker  . Smokeless tobacco: Never Used  Substance and Sexual Activity  . Alcohol use: Not Currently  . Drug use: Not Currently  . Sexual activity: Not Currently  Other Topics Concern  . Not on file  Social History Narrative   Lives with friend [name], son [name] and daughter in [employer]  and 2 grandsons   Social Determinants of Health   Financial Resource Strain: Not on file  Food Insecurity: Not on file  Transportation Needs: Not on file  Physical Activity: Not on file  Stress: Not on file  Social Connections: Not on file  Intimate Partner Violence: Not At Risk  . Fear of Current or Ex-Partner: No  . Emotionally Abused: No  . Physically Abused: No  . Sexually Abused: No    Outpatient Medications Prior to Visit  Medication Sig Dispense Refill  . acetaminophen (TYLENOL) 500 MG tablet Take 500-1,000 mg by mouth every 6 (six) hours as needed.    Marland Kitchen albuterol (VENTOLIN HFA) 108 (90 Base) MCG/ACT inhaler Inhale into the lungs every 6 (six) hours as needed for wheezing or shortness of breath.    Marland Kitchen amLODipine (NORVASC) 5 MG tablet Take 1 tablet (5 mg total) by mouth daily. 90 tablet 1  . aspirin EC 81 MG tablet Take 1 tablet (81 mg total) by mouth daily. Swallow whole. 30 tablet 11  . DULoxetine (CYMBALTA) 20 MG capsule Take 1 capsule (20 mg total) by mouth daily. 90 capsule 1  . gabapentin (NEURONTIN) 300 MG capsule Take 1 capsule (300 mg total) by mouth at bedtime. 90 capsule 1  . levETIRAcetam (KEPPRA) 750 MG tablet  Take 1 tablet (750 mg total) by mouth 2 (two) times daily. 180 tablet 1  . loperamide (IMODIUM) 2 MG capsule Take 2 mg by mouth daily.    . metFORMIN (GLUCOPHAGE) 1000 MG tablet Take 1 tablet (1,000 mg total) by mouth 2 (two) times daily with a meal. 180 tablet 1  . nystatin (MYCOSTATIN/NYSTOP) powder Apply 1 application topically 3 (three) times daily. 45 g 1  . oxycodone (OXY-IR) 5 MG capsule Take 5 mg by mouth every 4 (four) hours as needed.    . simvastatin (ZOCOR) 5 MG tablet Take 1 tablet (5 mg total) by mouth daily. 90 tablet 1  . glipiZIDE (GLUCOTROL) 10 MG tablet Take 1 tablet (10 mg total) by mouth 2 (two) times daily. 90 tablet 1  . solifenacin (VESICARE) 5 MG tablet Take 1 tablet (5 mg total) by mouth daily. 90 tablet 1   No facility-administered  medications prior to visit.    Allergies  Allergen Reactions  . Asa [Aspirin]   . Lisinopril     cough  . Molds & Smuts   . Penicillins     Review of Systems  Constitutional: Negative for chills and fever.  HENT: Negative for congestion, sinus pressure, sinus pain and sore throat.   Eyes: Negative for pain and discharge.  Respiratory: Negative for cough and shortness of breath.   Cardiovascular: Negative for chest pain and palpitations.  Gastrointestinal: Negative for abdominal pain, constipation, diarrhea, nausea and vomiting.  Endocrine: Negative for polydipsia and polyuria.  Genitourinary: Positive for urgency. Negative for dysuria and hematuria.  Musculoskeletal: Positive for arthralgias and gait problem. Negative for neck pain and neck stiffness.  Skin: Negative for rash.  Neurological: Positive for weakness. Negative for dizziness.  Psychiatric/Behavioral: Positive for behavioral problems and decreased concentration. Negative for agitation and suicidal ideas. The patient is nervous/anxious.        Objective:    Physical Exam Vitals reviewed.  Constitutional:      General: She is not in acute distress.    Appearance: She is not diaphoretic.  HENT:     Head: Normocephalic and atraumatic.     Nose: Nose normal.     Mouth/Throat:     Mouth: Mucous membranes are moist.  Eyes:     General: No scleral icterus.    Extraocular Movements: Extraocular movements intact.     Pupils: Pupils are equal, round, and reactive to light.  Cardiovascular:     Rate and Rhythm: Normal rate and regular rhythm.     Pulses: Normal pulses.     Heart sounds: Normal heart sounds. No murmur heard.   Pulmonary:     Breath sounds: Normal breath sounds. No wheezing or rales.  Abdominal:     Palpations: Abdomen is soft.     Tenderness: There is no abdominal tenderness.  Musculoskeletal:     Cervical back: Neck supple. No tenderness.     Right lower leg: No edema.     Left lower leg: No  edema.  Skin:    General: Skin is warm.     Findings: Rash (Erythematous underneath b/l breasts) present.  Neurological:     General: No focal deficit present.     Mental Status: She is alert and oriented to person, place, and time.     Gait: Gait abnormal.  Psychiatric:        Mood and Affect: Mood normal.        Behavior: Behavior normal.     BP (!) 157/77  Pulse 68   Ht _0  (1.676 m)   Wt 211 lb (95.7 kg)   SpO2 95%   BMI 34.06 kg/m  Wt Readings from Last 3 Encounters:  03/25/20 211 lb (95.7 kg)  02/20/20 208 lb 6.4 oz (94.5 kg)  12/27/19 202 lb (91.6 kg)    Health Maintenance Due  Topic Date Due  . FOOT EXAM  Never done  . OPHTHALMOLOGY EXAM  Never done  . URINE MICROALBUMIN  Never done  . TETANUS/TDAP  Never done  . DEXA SCAN  Never done  . PNA vac Low Risk Adult (1 of 2 - PCV13) Never done  . INFLUENZA VACCINE  Never done  . COVID-19 Vaccine (3 - Booster for Moderna series) 10/20/2019    There are no preventive care reminders to display for this patient.   No results found for: TSH No results found for: WBC, HGB, HCT, MCV, PLT No results found for: NA, K, CHLORIDE, CO2, GLUCOSE, BUN, CREATININE, BILITOT, ALKPHOS, AST, ALT, PROT, ALBUMIN, CALCIUM, ANIONGAP, EGFR, GFR Lab Results  Component Value Date   CHOL 150 02/20/2020   Lab Results  Component Value Date   HDL 50 02/20/2020   Lab Results  Component Value Date   LDLCALC 75 02/20/2020   Lab Results  Component Value Date   TRIG 144 02/20/2020   Lab Results  Component Value Date   CHOLHDL 3.0 02/20/2020   Lab Results  Component Value Date   HGBA1C 6.5 (H) 02/20/2020       Assessment & Plan:   Problem List Items Addressed This Visit      Endocrine   Type 2 diabetes mellitus with hyperglycemia, without long-term current use of insulin (HCC) Lab Results  Component Value Date   HGBA1C 6.5 (H) 02/20/2020   Decreased Glipizide to 5 mg BID, plan to DC later Continue Metformin    Relevant Medications   glipiZIDE (GLUCOTROL) 5 MG tablet     Genitourinary   Overactive bladder DC Vesicare as not much effective in her case and also for concern of cognitive decline Switched to [employer]   Relevant Medications   Vibegron (GEMTESA) 75 MG TABS    Other Visit Diagnoses    Intertrigo    -  Primary Keep area clean and dry Prescribed Ketoconazole   Relevant Medications   Ketoconazole-Hydrocortisone 2-2.5 % CREA   Physical deconditioning     Gait problems, uses walker Had benefit with PT in the past Referred to home health for PT and medication management   Relevant Orders   Ambulatory referral to [facility] ordered this encounter  Medications  . Ketoconazole-Hydrocortisone 2-2.5 % CREA    Sig: Apply 1 application topically daily.    Dispense:  30 g    Refill:  0  . Vibegron (GEMTESA) 75 MG TABS    Sig: Take 1 tablet by mouth daily.    Dispense:  30 tablet    Refill:  2  . glipiZIDE (GLUCOTROL) 5 MG tablet    Sig: Take 1 tablet (5 mg total) by mouth 2 (two) times daily.    Dispense:  60 tablet    Refill:  2    Please cancel the prescription of Glipizide 10 mg.     Lindell Spar, MD

## 2020-03-25 NOTE — Patient Instructions (Signed)
Please start taking Glipizide 10 mg instead of 5 mg.  Please start taking Gemtesa instead of Vesicare (Solifenacin).  You are being referred to Home health services for PT and medication management help.

## 2020-03-27 ENCOUNTER — Telehealth: Payer: Self-pay

## 2020-03-27 ENCOUNTER — Ambulatory Visit: Payer: Medicare Other | Admitting: Family Medicine

## 2020-03-27 NOTE — Telephone Encounter (Signed)
Please let Clydie Braun know this referral has been sent elsewhere due to staffing issues. Thank  you

## 2020-03-27 NOTE — Telephone Encounter (Signed)
Anna Robbins from [facility] needs to speak to nurse said referral was sent into her office and needs more about the referral.She would like for the nurse to give her a call [phone] to discuss the referral.

## 2020-04-01 ENCOUNTER — Telehealth: Payer: Self-pay

## 2020-04-01 NOTE — Telephone Encounter (Signed)
This was taken care of by dr patel earlier. Insurance would not approve the first medication he sent in different medicaiton

## 2020-04-01 NOTE — Telephone Encounter (Signed)
Pt is calling to get the Yeast infection rash medication, it was not called in

## 2020-04-03 ENCOUNTER — Other Ambulatory Visit: Payer: Self-pay

## 2020-04-03 ENCOUNTER — Telehealth: Payer: Self-pay

## 2020-04-03 DIAGNOSIS — L304 Erythema intertrigo: Secondary | ICD-10-CM

## 2020-04-03 NOTE — Telephone Encounter (Signed)
Pts daughter states that a cream was supposed to be called in --but the pharmacy has not received anything --please call the pt

## 2020-04-03 NOTE — Telephone Encounter (Signed)
Left message

## 2020-04-04 ENCOUNTER — Other Ambulatory Visit: Payer: Self-pay

## 2020-04-04 DIAGNOSIS — L304 Erythema intertrigo: Secondary | ICD-10-CM

## 2020-04-04 MED ORDER — KETOCONAZOLE-HYDROCORTISONE 2-2.5 % EX CREA: 1.0000 "application " | TOPICAL_CREAM | Freq: Every day | CUTANEOUS | 0 refills | Status: DC

## 2020-04-04 NOTE — Telephone Encounter (Signed)
Pt daughter informed this was sent in, will resend again as they did not receive it.

## 2020-04-08 ENCOUNTER — Other Ambulatory Visit: Payer: Self-pay | Admitting: *Deleted

## 2020-04-08 ENCOUNTER — Telehealth: Payer: Self-pay

## 2020-04-08 DIAGNOSIS — L304 Erythema intertrigo: Secondary | ICD-10-CM

## 2020-04-08 MED ORDER — KETOCONAZOLE-HYDROCORTISONE 2-2.5 % EX CREA: 1.0000 "application " | TOPICAL_CREAM | Freq: Every day | CUTANEOUS | 0 refills | Status: DC

## 2020-04-08 NOTE — Telephone Encounter (Signed)
Daughter in law came in and said [facility] deleted this medicine from their system needs to be resent in.  Ketoconazole-Hydrocortisone 2-2.5 % CREA  Pharmacy:  [facility] [place]

## 2020-04-08 NOTE — Telephone Encounter (Signed)
Medication was sent to pharmacy.

## 2020-05-20 NOTE — Progress Notes (Signed)
Subjective:   Anna Robbins is a 87 y.o. female who presents for an Initial Medicare Annual Wellness Visit.  I connected with Rinaldo Ratel today by telephone and verified that I am speaking with the correct person using two identifiers. Location patient: home Location provider: work Persons participating in the virtual visit: patient, provider.   I discussed the limitations, risks, security and privacy concerns of performing an evaluation and management service by telephone and the availability of in person appointments. I also discussed with the patient that there may be a patient responsible charge related to this service. The patient expressed understanding and verbally consented to this telephonic visit.    Interactive audio and video telecommunications were attempted between this provider and patient, however failed, due to patient having technical difficulties OR patient did not have access to video capability.  We continued and completed visit with audio only.      Review of Systems    N/A Cardiac Risk Factors include: advanced age (>85men, >61 women);diabetes mellitus;dyslipidemia;hypertension     Objective:    Today's Vitals   05/22/20 1339  PainSc: 7    There is no height or weight on file to calculate BMI.  Advanced Directives 05/22/2020  Does Patient Have a Medical Advance Directive? Yes  Type of Estate agent of [place];Living will  Does patient want to make changes to medical advance directive? No - Patient declined  Copy of Healthcare Power of Attorney in Chart? Yes - validated most recent copy scanned in chart (See row information)    Current Medications (verified) Outpatient Encounter Medications as of 05/22/2020  Medication Sig  . acetaminophen (TYLENOL) 500 MG tablet Take 500-1,000 mg by mouth every 6 (six) hours as needed.  Marland Kitchen albuterol (VENTOLIN HFA) 108 (90 Base) MCG/ACT inhaler Inhale into the lungs every 6 (six) hours as  needed for wheezing or shortness of breath.  Marland Kitchen amLODipine (NORVASC) 5 MG tablet Take 1 tablet (5 mg total) by mouth daily.  Marland Kitchen aspirin EC 81 MG tablet Take 1 tablet (81 mg total) by mouth daily. Swallow whole.  . DULoxetine (CYMBALTA) 20 MG capsule Take 1 capsule (20 mg total) by mouth daily.  Marland Kitchen gabapentin (NEURONTIN) 300 MG capsule Take 1 capsule (300 mg total) by mouth at bedtime.  Marland Kitchen glipiZIDE (GLUCOTROL) 5 MG tablet Take 1 tablet (5 mg total) by mouth 2 (two) times daily.  Marland Kitchen Ketoconazole-Hydrocortisone 2-2.5 % CREA Apply 1 application topically daily.  Marland Kitchen levETIRAcetam (KEPPRA) 750 MG tablet Take 1 tablet (750 mg total) by mouth 2 (two) times daily.  Marland Kitchen loperamide (IMODIUM) 2 MG capsule Take 2 mg by mouth daily.  . metFORMIN (GLUCOPHAGE) 1000 MG tablet Take 1 tablet (1,000 mg total) by mouth 2 (two) times daily with a meal.  . nystatin (MYCOSTATIN/NYSTOP) powder Apply 1 application topically 3 (three) times daily.  Marland Kitchen oxycodone (OXY-IR) 5 MG capsule Take 5 mg by mouth every 4 (four) hours as needed. (Patient not taking: Reported on 05/22/2020)  . simvastatin (ZOCOR) 5 MG tablet Take 1 tablet (5 mg total) by mouth daily.  . Vibegron (GEMTESA) 75 MG TABS Take 1 tablet by mouth daily.   No facility-administered encounter medications on file as of 05/22/2020.    Allergies (verified) Asa [aspirin], Lisinopril, Molds & smuts, and Penicillins   History: Past Medical History:  Diagnosis Date  . Asthma   . Diabetes mellitus without complication (HCC)   . Kidney stones 07/2017   required sx  . Pulmonary emboli (  HCC) 06/2017  . Seizures (HCC)   . Stroke [facility])    Past Surgical History:  Procedure Laterality Date  . ABDOMINAL HYSTERECTOMY    . APPENDECTOMY    . thumb surgery    . TONSILLECTOMY     History reviewed. No pertinent family history. Social History   Socioeconomic History  . Marital status: Widowed    Spouse name: Not on file  . Number of children: Not on file  . Years of  education: Not on file  . Highest education level: Not on file  Occupational History  . Not on file  Tobacco Use  . Smoking status: Never Smoker  . Smokeless tobacco: Never Used  Substance and Sexual Activity  . Alcohol use: Not Currently  . Drug use: Not Currently  . Sexual activity: Not Currently  Other Topics Concern  . Not on file  Social History Narrative   Lives with friend [name], son Francis Dowse and daughter in Social worker and 2 grandsons   Social Determinants of Health   Financial Resource Strain: Low Risk   . Difficulty of Paying Living Expenses: Not hard at all  Food Insecurity: No Food Insecurity  . Worried About Programme researcher, broadcasting/film/video in the Last Year: Never true  . Ran Out of Food in the Last Year: Never true  Transportation Needs: No Transportation Needs  . Lack of Transportation (Medical): No  . Lack of Transportation (Non-Medical): No  Physical Activity: Inactive  . Days of Exercise per Week: 0 days  . Minutes of Exercise per Session: 0 min  Stress: No Stress Concern Present  . Feeling of Stress : Only a little  Social Connections: Moderately Isolated  . Frequency of Communication with Friends and Family: More than three times a week  . Frequency of Social Gatherings with Friends and Family: More than three times a week  . Attends Religious Services: More than 4 times per year  . Active Member of Clubs or Organizations: No  . Attends Banker Meetings: Never  . Marital Status: Widowed    Tobacco Counseling Counseling given: Not Answered   Clinical Intake:  Pre-visit preparation completed: Yes  Pain : 0-10 Pain Score: 7  Pain Type: Chronic pain Pain Location: Knee Pain Orientation: Right,Left Pain Descriptors / Indicators: Aching Pain Onset: More than a month ago Pain Frequency: Intermittent     Nutritional Risks: Nausea/ vomitting/ diarrhea (diarrhea off and on for several years) Diabetes: Yes CBG done?: No Did pt. bring in CBG monitor from home?:  No  How often do you need to have someone help you when you read instructions, pamphlets, or other written materials from your doctor or pharmacy?: 1 - Never  Diabetic? Yes Nutrition Risk Assessment:  Has the patient had any N/V/D within the last 2 months?  Yes  Does the patient have any non-healing wounds?  No  Has the patient had any unintentional weight loss or weight gain?  No   Diabetes:  Is the patient diabetic?  Yes  If diabetic, was a CBG obtained today?  No  Did the patient bring in their glucometer from home?  No  How often do you monitor your CBG's? Patient states checks glucose every couple of days.   Financial Strains and Diabetes Management:  Are you having any financial strains with the device, your supplies or your medication? No .  Does the patient want to be seen by Chronic Care Management for management of their diabetes?  No  Would the  patient like to be referred to a Nutritionist or for Diabetic Management?  No   Diabetic Exams:  Diabetic Eye Exam: Overdue for diabetic eye exam. Pt has been advised about the importance in completing this exam. Patient advised to call and schedule an eye exam. Diabetic Foot Exam: Overdue, Pt has been advised about the importance in completing this exam. Pt is scheduled for diabetic foot exam on 06/25/2020.   Interpreter Needed?: No  Information entered by :: SCrews,LPN   Activities of Daily Living In your present state of health, do you have any difficulty performing the following activities: 05/22/2020  Hearing? N  Vision? N  Difficulty concentrating or making decisions? Y  Comment has difficulties focusing  Walking or climbing stairs? Y  Dressing or bathing? N  Doing errands, shopping? Y  Preparing Food and eating ? N  Using the Toilet? N  In the past six months, have you accidently leaked urine? Y  Do you have problems with loss of bowel control? Y  Managing your Medications? Y  Managing your Finances? N   Housekeeping or managing your Housekeeping? Y  Some recent data might be hidden    Patient Care Team: Freddy Finner, NP as PCP - General (Family Medicine)  Indicate any recent Medical Services you may have received from other than [facility] providers in the past year (date may be approximate).     Assessment:   This is a routine wellness examination for [place].  Hearing/Vision screen  Hearing Screening             Right ear:           Left ear:           Vision Screening Comments: Patient gets eye exams once per year. Has dx of macular degeneration. Patient wears reading glasses   Dietary issues and exercise activities discussed: Current Exercise Habits: The patient does not participate in regular exercise at present, Exercise limited by: neurologic condition(s);orthopedic condition(s)  Goals    . HEMOGLOBIN A1C < 7    . Prevent falls      Depression Screen PHQ 2/9 Scores 05/22/2020 12/27/2019 12/27/2019  PHQ - 2 Score PHQ- 9 Score Fall Risk Fall Risk  05/22/2020 03/25/2020 12/27/2019  Falls in the past year? 1 0 1  Number falls in past yr: 0 0 0  Injury with Fall? 0 0 1  Risk for fall due to : Impaired balance/gait;Impaired mobility - Impaired balance/gait  Follow up Falls evaluation completed;Falls prevention discussed - Falls evaluation completed    FALL RISK PREVENTION PERTAINING TO THE HOME:  Any stairs in or around the home? No  If so, are there any without handrails? No  Home free of loose throw rugs in walkways, pet beds, electrical cords, etc? Yes  Adequate lighting in your home to reduce risk of falls? Yes   ASSISTIVE DEVICES UTILIZED TO PREVENT FALLS:  Life alert? No  Use of a cane, walker or w/c? Yes  Grab bars in the bathroom? Yes  Shower chair or bench in shower? Yes  Elevated toilet seat or a handicapped toilet? Yes    Cognitive Function:        Immunizations Immunization  History  Administered Date(s) Administered  . Moderna Sars-Covid-2 Vaccination 03/22/2019, 04/19/2019    TDAP status: Due, Education has been provided regarding the importance of this vaccine. Advised may receive this vaccine at local pharmacy or Health Dept. Aware  to provide a copy of the vaccination record if obtained from local pharmacy or Health Dept. Verbalized acceptance and understanding.  Flu Vaccine status: Due, Education has been provided regarding the importance of this vaccine. Advised may receive this vaccine at local pharmacy or Health Dept. Aware to provide a copy of the vaccination record if obtained from local pharmacy or Health Dept. Verbalized acceptance and understanding.  Pneumococcal vaccine status: Due, Education has been provided regarding the importance of this vaccine. Advised may receive this vaccine at local pharmacy or Health Dept. Aware to provide a copy of the vaccination record if obtained from local pharmacy or Health Dept. Verbalized acceptance and understanding.  Covid-19 vaccine status: Completed vaccines  Qualifies for Shingles Vaccine? Yes   Zostavax completed No   Shingrix Completed?: No.    Education has been provided regarding the importance of this vaccine. Patient has been advised to call insurance company to determine out of pocket expense if they have not yet received this vaccine. Advised may also receive vaccine at local pharmacy or Health Dept. Verbalized acceptance and understanding.  Screening Tests Health Maintenance  Topic Date Due  . FOOT EXAM  Never done  . OPHTHALMOLOGY EXAM  Never done  . URINE MICROALBUMIN  Never done  . TETANUS/TDAP  Never done  . PNA vac Low Risk Adult (1 of 2 - PCV13) Never done  . COVID-19 Vaccine (3 - Booster for Moderna series) 10/20/2019  . HEMOGLOBIN A1C  08/19/2020  . INFLUENZA VACCINE  08/26/2020  . HPV VACCINES  Aged Out  . DEXA SCAN  Discontinued    Health Maintenance  Health Maintenance Due   Topic Date Due  . FOOT EXAM  Never done  . OPHTHALMOLOGY EXAM  Never done  . URINE MICROALBUMIN  Never done  . TETANUS/TDAP  Never done  . PNA vac Low Risk Adult (1 of 2 - PCV13) Never done  . COVID-19 Vaccine (3 - Booster for Moderna series) 10/20/2019    Colorectal cancer screening: No longer required.   Mammogram status: No longer required due to age.  Bone Density Status: No longer required    Lung Cancer Screening: (Low Dose CT Chest recommended if Age 69-80 years, 30 pack-year currently smoking OR have quit w/in 15years.) does not qualify.   Lung Cancer Screening Referral: N/A   Additional Screening:  Hepatitis C Screening: does not qualify;   Vision Screening: Recommended annual ophthalmology exams for early detection of glaucoma and other disorders of the eye. Is the patient up to date with their annual eye exam?  Yes  Who is the provider or what is the name of the office in which the patient attends annual eye exams? Patient unsure eye doctors name  If pt is not established with a provider, would they like to be referred to a provider to establish care? No .   Dental Screening: Recommended annual dental exams for proper oral hygiene  Community Resource Referral / Chronic Care Management: CRR required this visit?  No   CCM required this visit?  No      Plan:     I have personally reviewed and noted the following in the patient's chart:   . Medical and social history . Use of alcohol, tobacco or illicit drugs  . Current medications and supplements . Functional ability and status . Nutritional status . Physical activity . Advanced directives . List of other physicians . Hospitalizations, surgeries, and ER visits in previous 12 months . Vitals . Screenings  to include cognitive, depression, and falls . Referrals and appointments  In addition, I have reviewed and discussed with patient certain preventive protocols, quality metrics, and best practice  recommendations. A written personalized care plan for preventive services as well as general preventive health recommendations were provided to patient.     Theodora BlowShannon Srihari Shellhammer, LPN   5/40/98114/27/2022   Nurse Notes: None

## 2020-05-22 ENCOUNTER — Other Ambulatory Visit: Payer: Self-pay

## 2020-05-22 ENCOUNTER — Ambulatory Visit (INDEPENDENT_AMBULATORY_CARE_PROVIDER_SITE_OTHER): Payer: Medicare Other

## 2020-05-22 DIAGNOSIS — Z0000 Encounter for general adult medical examination without abnormal findings: Secondary | ICD-10-CM | POA: Diagnosis not present

## 2020-05-22 NOTE — Patient Instructions (Signed)
[name] , Thank you for taking time to come for your Medicare Wellness Visit. I appreciate your ongoing commitment to your health goals. Please review the following plan we discussed and let me know if I can assist you in the future.   Screening recommendations/referrals: Colonoscopy: No longer required  Mammogram: No longer required  Bone Density: No longer required  Recommended yearly ophthalmology/optometry visit for glaucoma screening and checkup Recommended yearly dental visit for hygiene and checkup  Vaccinations: Influenza vaccine: Up to date, next due fall 2022  Pneumococcal vaccine: Completed series  Tdap vaccine: Up to date, we will need to get records of your previous vaccines  Shingles vaccine: Completed series     Advanced directives: Copies on file   Conditions/risks identified: None   Next appointment: 06/25/2020 @ 11:00 am with Dr. Allena Katz    Preventive Care 65 Years and Older, Female Preventive care refers to lifestyle choices and visits with your health care provider that can promote health and wellness. What does preventive care include?  A yearly physical exam. This is also called an annual well check.  Dental exams once or twice a year.  Routine eye exams. Ask your health care provider how often you should have your eyes checked.  Personal lifestyle choices, including:  Daily care of your teeth and gums.  Regular physical activity.  Eating a healthy diet.  Avoiding tobacco and drug use.  Limiting alcohol use.  Practicing safe sex.  Taking low-dose aspirin every day.  Taking vitamin and mineral supplements as recommended by your health care provider. What happens during an annual well check? The services and screenings done by your health care provider during your annual well check will depend on your age, overall health, lifestyle risk factors, and family history of disease. Counseling  Your health care provider may ask you questions about  your:  Alcohol use.  Tobacco use.  Drug use.  Emotional well-being.  Home and relationship well-being.  Sexual activity.  Eating habits.  History of falls.  Memory and ability to understand (cognition).  Work and work Astronomer.  Reproductive health. Screening  You may have the following tests or measurements:  Height, weight, and BMI.  Blood pressure.  Lipid and cholesterol levels. These may be checked every 5 years, or more frequently if you are over 62 years old.  Skin check.  Lung cancer screening. You may have this screening every year starting at age 23 if you have a 30-pack-year history of smoking and currently smoke or have quit within the past 15 years.  Fecal occult blood test (FOBT) of the stool. You may have this test every year starting at age 72.  Flexible sigmoidoscopy or colonoscopy. You may have a sigmoidoscopy every 5 years or a colonoscopy every 10 years starting at age 61.  Hepatitis C blood test.  Hepatitis B blood test.  Sexually transmitted disease (STD) testing.  Diabetes screening. This is done by checking your blood sugar (glucose) after you have not eaten for a while (fasting). You may have this done every 1-3 years.  Bone density scan. This is done to screen for osteoporosis. You may have this done starting at age 85.  Mammogram. This may be done every 1-2 years. Talk to your health care provider about how often you should have regular mammograms. Talk with your health care provider about your test results, treatment options, and if necessary, the need for more tests. Vaccines  Your health care provider may recommend certain vaccines, such  as:  Influenza vaccine. This is recommended every year.  Tetanus, diphtheria, and acellular pertussis (Tdap, Td) vaccine. You may need a Td booster every 10 years.  Zoster vaccine. You may need this after age 18.  Pneumococcal 13-valent conjugate (PCV13) vaccine. One dose is recommended  after age 67.  Pneumococcal polysaccharide (PPSV23) vaccine. One dose is recommended after age 61. Talk to your health care provider about which screenings and vaccines you need and how often you need them. This information is not intended to replace advice given to you by your health care provider. Make sure you discuss any questions you have with your health care provider. Document Released: 02/08/2015 Document Revised: 10/02/2015 Document Reviewed: 11/13/2014 Elsevier Interactive Patient Education  2017 [place] Prevention in the Home Falls can cause injuries. They can happen to people of all ages. There are many things you can do to make your home safe and to help prevent falls. What can I do on the outside of my home?  Regularly fix the edges of walkways and driveways and fix any cracks.  Remove anything that might make you trip as you walk through a door, such as a raised step or threshold.  Trim any bushes or trees on the path to your home.  Use bright outdoor lighting.  Clear any walking paths of anything that might make someone trip, such as rocks or tools.  Regularly check to see if handrails are loose or broken. Make sure that both sides of any steps have handrails.  Any raised decks and porches should have guardrails on the edges.  Have any leaves, snow, or ice cleared regularly.  Use sand or salt on walking paths during winter.  Clean up any spills in your garage right away. This includes oil or grease spills. What can I do in the bathroom?  Use night lights.  Install grab bars by the toilet and in the tub and shower. Do not use towel bars as grab bars.  Use non-skid mats or decals in the tub or shower.  If you need to sit down in the shower, use a plastic, non-slip stool.  Keep the floor dry. Clean up any water that spills on the floor as soon as it happens.  Remove soap buildup in the tub or shower regularly.  Attach bath mats securely with  double-sided non-slip rug tape.  Do not have throw rugs and other things on the floor that can make you trip. What can I do in the bedroom?  Use night lights.  Make sure that you have a light by your bed that is easy to reach.  Do not use any sheets or blankets that are too big for your bed. They should not hang down onto the floor.  Have a firm chair that has side arms. You can use this for support while you get dressed.  Do not have throw rugs and other things on the floor that can make you trip. What can I do in the kitchen?  Clean up any spills right away.  Avoid walking on wet floors.  Keep items that you use a lot in easy-to-reach places.  If you need to reach something above you, use a strong step stool that has a grab bar.  Keep electrical cords out of the way.  Do not use floor polish or wax that makes floors slippery. If you must use wax, use non-skid floor wax.  Do not have throw rugs and other things on the  floor that can make you trip. What can I do with my stairs?  Do not leave any items on the stairs.  Make sure that there are handrails on both sides of the stairs and use them. Fix handrails that are broken or loose. Make sure that handrails are as long as the stairways.  Check any carpeting to make sure that it is firmly attached to the stairs. Fix any carpet that is loose or worn.  Avoid having throw rugs at the top or bottom of the stairs. If you do have throw rugs, attach them to the floor with carpet tape.  Make sure that you have a light switch at the top of the stairs and the bottom of the stairs. If you do not have them, ask someone to add them for you. What else can I do to help prevent falls?  Wear shoes that:  Do not have high heels.  Have rubber bottoms.  Are comfortable and fit you well.  Are closed at the toe. Do not wear sandals.  If you use a stepladder:  Make sure that it is fully opened. Do not climb a closed stepladder.  Make  sure that both sides of the stepladder are locked into place.  Ask someone to hold it for you, if possible.  Clearly mark and make sure that you can see:  Any grab bars or handrails.  First and last steps.  Where the edge of each step is.  Use tools that help you move around (mobility aids) if they are needed. These include:  Canes.  Walkers.  Scooters.  Crutches.  Turn on the lights when you go into a dark area. Replace any light bulbs as soon as they burn out.  Set up your furniture so you have a clear path. Avoid moving your furniture around.  If any of your floors are uneven, fix them.  If there are any pets around you, be aware of where they are.  Review your medicines with your doctor. Some medicines can make you feel dizzy. This can increase your chance of falling. Ask your doctor what other things that you can do to help prevent falls. This information is not intended to replace advice given to you by your health care provider. Make sure you discuss any questions you have with your health care provider. Document Released: 11/08/2008 Document Revised: 06/20/2015 Document Reviewed: 02/16/2014 Elsevier Interactive Patient Education  2017 [employer].

## 2020-06-10 ENCOUNTER — Ambulatory Visit: Payer: Medicare Other | Admitting: Neurology

## 2020-06-22 ENCOUNTER — Other Ambulatory Visit: Payer: Self-pay | Admitting: Internal Medicine

## 2020-06-22 DIAGNOSIS — E1165 Type 2 diabetes mellitus with hyperglycemia: Secondary | ICD-10-CM

## 2020-06-25 ENCOUNTER — Other Ambulatory Visit: Payer: Self-pay

## 2020-06-25 ENCOUNTER — Ambulatory Visit (HOSPITAL_COMMUNITY)
Admission: RE | Admit: 2020-06-25 | Discharge: 2020-06-25 | Disposition: A | Discharge status: Home / Self Care | Payer: Medicare Other | Source: Ambulatory Visit | Attending: Internal Medicine | Admitting: Internal Medicine

## 2020-06-25 ENCOUNTER — Ambulatory Visit (INDEPENDENT_AMBULATORY_CARE_PROVIDER_SITE_OTHER): Payer: Medicare Other | Admitting: Internal Medicine

## 2020-06-25 ENCOUNTER — Encounter: Payer: Self-pay | Admitting: Internal Medicine

## 2020-06-25 VITALS — BP 142/72 | HR 77 | Resp 18 | Ht 65.0 in | Wt 208.4 lb

## 2020-06-25 DIAGNOSIS — E1165 Type 2 diabetes mellitus with hyperglycemia: Secondary | ICD-10-CM | POA: Diagnosis not present

## 2020-06-25 DIAGNOSIS — M7989 Other specified soft tissue disorders: Secondary | ICD-10-CM | POA: Insufficient documentation

## 2020-06-25 DIAGNOSIS — F320 Major depressive disorder, single episode, mild: Secondary | ICD-10-CM

## 2020-06-25 DIAGNOSIS — I10 Essential (primary) hypertension: Secondary | ICD-10-CM

## 2020-06-25 DIAGNOSIS — N390 Urinary tract infection, site not specified: Secondary | ICD-10-CM | POA: Diagnosis not present

## 2020-06-25 DIAGNOSIS — M170 Bilateral primary osteoarthritis of knee: Secondary | ICD-10-CM | POA: Insufficient documentation

## 2020-06-25 DIAGNOSIS — E785 Hyperlipidemia, unspecified: Secondary | ICD-10-CM

## 2020-06-25 LAB — POCT URINALYSIS DIP (CLINITEK)
Glucose, UA: NEGATIVE mg/dL
Nitrite, UA: NEGATIVE
POC PROTEIN,UA: 30 — AB
Spec Grav, UA: 1.025 (ref 1.010–1.025)
Urobilinogen, UA: 0.2 E.U./dL
pH, UA: 5 (ref 5.0–8.0)

## 2020-06-25 MED ORDER — DULOXETINE HCL 20 MG PO CPEP: 20.0000 mg | ORAL_CAPSULE | Freq: Every day | ORAL | 1 refills | Status: DC

## 2020-06-25 MED ORDER — GABAPENTIN 300 MG PO CAPS: 300.0000 mg | ORAL_CAPSULE | Freq: Every day | ORAL | 1 refills | Status: DC

## 2020-06-25 MED ORDER — METFORMIN HCL 1000 MG PO TABS: 1000.0000 mg | ORAL_TABLET | Freq: Two times a day (BID) | ORAL | 1 refills | Status: DC

## 2020-06-25 MED ORDER — SIMVASTATIN 5 MG PO TABS: 5.0000 mg | ORAL_TABLET | Freq: Every day | ORAL | 1 refills | Status: DC

## 2020-06-25 MED ORDER — AMLODIPINE BESYLATE 5 MG PO TABS: 5.0000 mg | ORAL_TABLET | Freq: Every day | ORAL | 1 refills | Status: DC

## 2020-06-25 MED ORDER — SULFAMETHOXAZOLE-TRIMETHOPRIM 800-160 MG PO TABS: 1.0000 | ORAL_TABLET | Freq: Two times a day (BID) | ORAL | 0 refills | Status: DC

## 2020-06-25 NOTE — Patient Instructions (Addendum)
Please start taking Bactrim as prescribed for UTI.  You are being scheduled for Ultrasound of left leg to rule out DVT.  You are being referred to Orthopedic surgery.  Please discuss about Aspirin with your Neurologist.

## 2020-06-25 NOTE — Progress Notes (Signed)
Established Patient Office Visit  Subjective:  Patient ID: Anna Robbins, female    DOB: 1932/04/20  Age: 87 y.o. MRN: [id number]  CC:  Chief Complaint  Patient presents with  . Follow-up    3 month follow up pt left leg is no better its worse the swelling at night is worse if she is on it also wants to stop aspirin as she has bruising she also needs a letter for [employer] to pick up her oxygen thinks she has a bladder infection     HPI Anna Robbins presents for follow up of her chronic medical conditions and evaluation of urinary frequency.  She has been having dysuria, increased urinary frequency and lower abdominal pressure for last 3-4 days. Denies fever, chills, nausea or vomiting. Denies hematuria or flank pain.  She reports left leg swelling for last 3 days. It has been red and warm compared to right leg. She has been less mobile lately. Denies dyspnea, chest pain or palpitations.  She asks whether she can stop Aspirin as it has been causing bruises over her arms. She has been seen by Neurologist for h/o CVA and was advised to start Aspirin.  Her HbA1C is 6.5. Denies polyuria or polyphagia. On Metformin and Glipizide.  BP is well-controlled overall according to her age. Takes medications regularly. Patient denies headache, dizziness, chest pain, dyspnea or palpitations.   Past Medical History:  Diagnosis Date  . Asthma   . Diabetes mellitus without complication ([place])   . Kidney stones 07/2017   required sx  . Pulmonary emboli ([place]) 06/2017  . Seizures ([place])   . Stroke [facility])     Past Surgical History:  Procedure Laterality Date  . ABDOMINAL HYSTERECTOMY    . APPENDECTOMY    . thumb surgery    . TONSILLECTOMY      History reviewed. No pertinent family history.  Social History   Socioeconomic History  . Marital status: Widowed    Spouse name: Not on file  . Number of children: Not on file  . Years of education: Not on file  . Highest education level: Not  on file  Occupational History  . Not on file  Tobacco Use  . Smoking status: Never Smoker  . Smokeless tobacco: Never Used  Substance and Sexual Activity  . Alcohol use: Not Currently  . Drug use: Not Currently  . Sexual activity: Not Currently  Other Topics Concern  . Not on file  Social History Narrative   Lives with friend [name], son Anna Robbins and daughter in [employer] and 2 grandsons   Social Determinants of Health   Financial Resource Strain: Low Risk   . Difficulty of Paying Living Expenses: Not hard at all  Food Insecurity: No Food Insecurity  . Worried About Charity fundraiser in the Last Year: Never true  . Ran Out of Food in the Last Year: Never true  Transportation Needs: No Transportation Needs  . Lack of Transportation (Medical): No  . Lack of Transportation (Non-Medical): No  Physical Activity: Inactive  . Days of Exercise per Week: 0 days  . Minutes of Exercise per Session: 0 min  Stress: No Stress Concern Present  . Feeling of Stress : Only a little  Social Connections: Moderately Isolated  . Frequency of Communication with Friends and Family: More than three times a week  . Frequency of Social Gatherings with Friends and Family: More than three times a week  . Attends Religious Services: More than 4 times  per year  . Active Member of Clubs or Organizations: No  . Attends Archivist Meetings: Never  . Marital Status: Widowed  Intimate Partner Violence: Not At Risk  . Fear of Current or Ex-Partner: No  . Emotionally Abused: No  . Physically Abused: No  . Sexually Abused: No    Outpatient Medications Prior to Visit  Medication Sig Dispense Refill  . acetaminophen (TYLENOL) 500 MG tablet Take 500-1,000 mg by mouth every 6 (six) hours as needed.    Marland Kitchen albuterol (VENTOLIN HFA) 108 (90 Base) MCG/ACT inhaler Inhale into the lungs every 6 (six) hours as needed for wheezing or shortness of breath.    Marland Kitchen amLODipine (NORVASC) 5 MG tablet Take 1 tablet (5 mg total) by  mouth daily. 90 tablet 1  . aspirin EC 81 MG tablet Take 1 tablet (81 mg total) by mouth daily. Swallow whole. 30 tablet 11  . DULoxetine (CYMBALTA) 20 MG capsule Take 1 capsule (20 mg total) by mouth daily. 90 capsule 1  . gabapentin (NEURONTIN) 300 MG capsule Take 1 capsule (300 mg total) by mouth at bedtime. 90 capsule 1  . glipiZIDE (GLUCOTROL) 5 MG tablet TAKE 1 TABLET(5 MG) BY MOUTH TWICE DAILY 60 tablet 2  . Ketoconazole-Hydrocortisone 2-2.5 % CREA Apply 1 application topically daily. 30 g 0  . levETIRAcetam (KEPPRA) 750 MG tablet Take 1 tablet (750 mg total) by mouth 2 (two) times daily. 180 tablet 1  . loperamide (IMODIUM) 2 MG capsule Take 2 mg by mouth daily.    . metFORMIN (GLUCOPHAGE) 1000 MG tablet Take 1 tablet (1,000 mg total) by mouth 2 (two) times daily with a meal. 180 tablet 1  . nystatin (MYCOSTATIN/NYSTOP) powder Apply 1 application topically 3 (three) times daily. 45 g 1  . simvastatin (ZOCOR) 5 MG tablet Take 1 tablet (5 mg total) by mouth daily. 90 tablet 1  . Vibegron (GEMTESA) 75 MG TABS Take 1 tablet by mouth daily. 30 tablet 2  . oxycodone (OXY-IR) 5 MG capsule Take 5 mg by mouth every 4 (four) hours as needed. (Patient not taking: Reported on 06/25/2020)     No facility-administered medications prior to visit.    Allergies  Allergen Reactions  . Acetaminophen   . Asa [Aspirin]   . Diphenhydramine Hcl   . Lisinopril     cough  . Melatonin   . Molds & Smuts   . Other   . Oxycodone Hcl   . Penicillin G   . Penicillins   . Tramadol     ROS Review of Systems  Constitutional: Negative for chills and fever.  HENT: Negative for congestion, sinus pressure, sinus pain and sore throat.   Eyes: Negative for pain and discharge.  Respiratory: Negative for cough and shortness of breath.   Cardiovascular: Negative for chest pain and palpitations.  Gastrointestinal: Negative for abdominal pain, constipation, diarrhea, nausea and vomiting.  Endocrine: Negative for  polydipsia and polyuria.  Genitourinary: Positive for frequency and urgency. Negative for dysuria and hematuria.  Musculoskeletal: Positive for arthralgias and gait problem. Negative for neck pain and neck stiffness.  Skin: Negative for rash.  Neurological: Positive for weakness. Negative for dizziness.  Psychiatric/Behavioral: Positive for behavioral problems and decreased concentration. Negative for agitation and suicidal ideas. The patient is nervous/anxious.       Objective:    Physical Exam Vitals reviewed.  Constitutional:      General: She is not in acute distress.    Appearance: She is not  diaphoretic.  HENT:     Head: Normocephalic and atraumatic.     Nose: Nose normal.     Mouth/Throat:     Mouth: Mucous membranes are moist.  Eyes:     General: No scleral icterus.    Extraocular Movements: Extraocular movements intact.  Cardiovascular:     Rate and Rhythm: Normal rate and regular rhythm.     Pulses: Normal pulses.     Heart sounds: Normal heart sounds. No murmur heard.   Pulmonary:     Breath sounds: Normal breath sounds. No wheezing or rales.  Abdominal:     Palpations: Abdomen is soft.     Tenderness: There is no abdominal tenderness.  Musculoskeletal:     Cervical back: Neck supple. No tenderness.     Right lower leg: No edema.     Left lower leg: No edema.  Skin:    General: Skin is warm.     Findings: Bruising (Over b/l hands and forearms) present. No rash.  Neurological:     General: No focal deficit present.     Mental Status: She is alert and oriented to person, place, and time.     Gait: Gait abnormal.  Psychiatric:        Mood and Affect: Mood normal.        Behavior: Behavior normal.     BP (!) 142/72 (BP Location: Left Arm, Patient Position: Sitting, Cuff Size: Normal)   Pulse 77   Resp 18   Ht 5' 5"  (1.651 m)   Wt 208 lb 6.4 oz (94.5 kg)   SpO2 96%   BMI 34.68 kg/m  Wt Readings from Last 3 Encounters:  06/25/20 208 lb 6.4 oz (94.5  kg)  03/25/20 211 lb (95.7 kg)  02/20/20 208 lb 6.4 oz (94.5 kg)     Health Maintenance Due  Topic Date Due  . Pneumococcal Vaccine 2-5 Years old (1 of 2 - PPSV23) Never done  . FOOT EXAM  Never done  . OPHTHALMOLOGY EXAM  Never done  . URINE MICROALBUMIN  Never done  . TETANUS/TDAP  Never done  . Zoster Vaccines- Shingrix (1 of 2) Never done  . PNA vac Low Risk Adult (1 of 2 - PCV13) Never done  . COVID-19 Vaccine (3 - Booster for Moderna series) 09/19/2019    There are no preventive care reminders to display for this patient.  No results found for: TSH No results found for: WBC, HGB, HCT, MCV, PLT No results found for: NA, K, CHLORIDE, CO2, GLUCOSE, BUN, CREATININE, BILITOT, ALKPHOS, AST, ALT, PROT, ALBUMIN, CALCIUM, ANIONGAP, EGFR, GFR Lab Results  Component Value Date   CHOL 150 02/20/2020   Lab Results  Component Value Date   HDL 50 02/20/2020   Lab Results  Component Value Date   LDLCALC 75 02/20/2020   Lab Results  Component Value Date   TRIG 144 02/20/2020   Lab Results  Component Value Date   CHOLHDL 3.0 02/20/2020   Lab Results  Component Value Date   HGBA1C 6.5 (H) 02/20/2020      Assessment & Plan:   Problem List Items Addressed This Visit      Cardiovascular and Mediastinum   Essential hypertension    BP Readings from Last 1 Encounters:  06/25/20 (!) 142/72   Overall well-controlled with Amlodipine Counseled for compliance with the medications Advised DASH diet and moderate exercise/walking, at least 150 mins/week        Endocrine   Type 2 diabetes  mellitus with hyperglycemia, without long-term current use of insulin (HCC) - Primary    Lab Results  Component Value Date   HGBA1C 6.5 (H) 02/20/2020   Well-controlled with Metformin and Glipizide On statin Advised to follow diabetic diet Diabetic eye exam: Advised to follow up with Ophthalmology for diabetic eye exam       Relevant Orders   Microalbumin, urine      Musculoskeletal and Integument   Osteoarthritis of both knees    Tylenol PRN      Relevant Orders   Ambulatory referral to Orthopedic Surgery     Other   Left leg swelling    Concerning for DVT Will order stat US doppler      Relevant Orders   US Venous Img Lower Unilateral Left (Completed)    Other Visit Diagnoses    Urinary tract infection without hematuria, site unspecified     UA reviewed Check urine culture Started Bactrim DS   Relevant Medications   sulfamethoxazole-trimethoprim (BACTRIM DS) 800-160 MG tablet   Other Relevant Orders   POCT URINALYSIS DIP (CLINITEK) (Completed)   Urine Culture (Completed)      Meds ordered this encounter  Medications  . sulfamethoxazole-trimethoprim (BACTRIM DS) 800-160 MG tablet    Sig: Take 1 tablet by mouth 2 (two) times daily.    Dispense:  10 tablet    Refill:  0    Follow-up: Return in about 4 months (around 10/25/2020).    Lindell Spar, MD

## 2020-06-28 NOTE — Assessment & Plan Note (Signed)
Lab Results  Component Value Date   HGBA1C 6.5 (H) 02/20/2020   Well-controlled with Metformin and Glipizide On statin Advised to follow diabetic diet Diabetic eye exam: Advised to follow up with Ophthalmology for diabetic eye exam

## 2020-06-28 NOTE — Assessment & Plan Note (Signed)
Tylenol PRN

## 2020-06-28 NOTE — Assessment & Plan Note (Signed)
Concerning for DVT Will order stat US doppler

## 2020-06-28 NOTE — Assessment & Plan Note (Signed)
BP Readings from Last 1 Encounters:  06/25/20 (!) 142/72   Overall well-controlled with Amlodipine Counseled for compliance with the medications Advised DASH diet and moderate exercise/walking, at least 150 mins/week

## 2020-06-29 LAB — URINE CULTURE

## 2020-07-02 ENCOUNTER — Other Ambulatory Visit: Payer: Self-pay | Admitting: *Deleted

## 2020-07-02 DIAGNOSIS — G40909 Epilepsy, unspecified, not intractable, without status epilepticus: Secondary | ICD-10-CM

## 2020-07-02 DIAGNOSIS — I6930 Unspecified sequelae of cerebral infarction: Secondary | ICD-10-CM

## 2020-07-02 MED ORDER — LEVETIRACETAM 750 MG PO TABS: 750.0000 mg | ORAL_TABLET | Freq: Two times a day (BID) | ORAL | 1 refills | Status: DC

## 2020-07-08 ENCOUNTER — Other Ambulatory Visit: Payer: Self-pay

## 2020-07-08 ENCOUNTER — Ambulatory Visit (INDEPENDENT_AMBULATORY_CARE_PROVIDER_SITE_OTHER): Payer: Medicare Other

## 2020-07-08 ENCOUNTER — Encounter: Payer: Self-pay | Admitting: Orthopedic Surgery

## 2020-07-08 ENCOUNTER — Ambulatory Visit: Payer: Medicare Other | Admitting: Orthopedic Surgery

## 2020-07-08 VITALS — BP 198/89 | HR 94 | Ht 65.0 in | Wt 208.0 lb

## 2020-07-08 DIAGNOSIS — M25561 Pain in right knee: Secondary | ICD-10-CM | POA: Diagnosis not present

## 2020-07-08 DIAGNOSIS — M170 Bilateral primary osteoarthritis of knee: Secondary | ICD-10-CM | POA: Diagnosis not present

## 2020-07-08 DIAGNOSIS — G8929 Other chronic pain: Secondary | ICD-10-CM

## 2020-07-08 DIAGNOSIS — M25562 Pain in left knee: Secondary | ICD-10-CM | POA: Diagnosis not present

## 2020-07-08 NOTE — Progress Notes (Signed)
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  She had a stroke right before she moved and has not been able to get the injections  She reports tiredness itching ringing of the ears congestion blurred vision double vision light and sensitivity leg swelling cough shortness of breath wheezing frequency abdominal pain and diarrhea easy bruising pollen allergy frequent falls weakness seizures headache dizziness depression and memory loss   Past Medical History:  Diagnosis Date   Asthma    Diabetes mellitus without complication (HCC)    Kidney stones 07/2017   required sx   Pulmonary emboli ([facility]) 06/2017   Seizures ([facility]CC)    Stroke ([facility]CC)     BP (!) 198/89   Pulse 94   Ht 5\' 5"  (1.651 m)   Wt 208 lb (94.3 kg)   BMI 34.61 kg/m  Physical Exam Constitutional:      General: She is not in acute distress.    Appearance: She is well-developed.     Comments: Well developed, well nourished Normal grooming and hygiene     Cardiovascular:     Comments: No peripheral edema Musculoskeletal:     Comments: Right knee and left knee are in varus.  Tenderness medial joint line with mild effusions.  Flexion contractures are 5 to 8 degrees bilaterally with flexion arc of 110 degrees    Skin:    General: Skin is warm and dry.  Neurological:     Mental Status: She is alert and oriented to person, place, and time.     Sensory: No sensory deficit.     Coordination: Coordination normal.     Gait: Gait normal.     Deep Tendon Reflexes: Reflexes are normal and symmetric.  Psychiatric:        Mood and Affect: Mood normal.        Behavior: Behavior normal.        Thought Content: Thought content normal.        Judgment: Judgment normal.     Comments: Affect normal      Procedure  Bilateral knee injection  Patient gave consent for bilateral knee injection we started with the right knee the knee was cleaned with alcohol and ethyl chloride and sprayed for numbness we injected 40 mg of Depo-Medrol and 2 cc 1% lidocaine in the right knee and repeated the procedure in the left knee

## 2020-09-16 ENCOUNTER — Other Ambulatory Visit: Payer: Self-pay

## 2020-09-16 ENCOUNTER — Ambulatory Visit: Payer: Medicare Other | Admitting: Neurology

## 2020-09-16 ENCOUNTER — Encounter: Payer: Self-pay | Admitting: Neurology

## 2020-09-16 VITALS — BP 147/77 | HR 80 | Ht 65.0 in | Wt 209.5 lb

## 2020-09-16 DIAGNOSIS — Z8673 Personal history of transient ischemic attack (TIA), and cerebral infarction without residual deficits: Secondary | ICD-10-CM

## 2020-09-16 DIAGNOSIS — G5601 Carpal tunnel syndrome, right upper limb: Secondary | ICD-10-CM

## 2020-09-16 DIAGNOSIS — R202 Paresthesia of skin: Secondary | ICD-10-CM

## 2020-09-16 MED ORDER — GABAPENTIN 300 MG PO CAPS: 600.0000 mg | ORAL_CAPSULE | Freq: Every day | ORAL | 1 refills | Status: DC

## 2020-09-16 NOTE — Progress Notes (Signed)
[facility] [address] [place]. [place] [address] [phone]       OFFICE FOLLOW UP VISIT NOTE  [name] Date of Birth:  11/17/1932 Medical Record Number:  [id number]   Referring MD:  Tereasa Coop, FNP  Reason for Referral:   stroke  HPI: Initial visit 02/20/2020 : Anna Robbins is a 87 year old pleasant Caucasian lady seen today for initial office consultation visit.  She is accompanied by a friend Anna Robbins.  History is obtained from them, review of referral notes and was unable to personally review imaging films or reports in PACS.  She has past medical history of diabetes, hypertension, hyperlipidemia and pulmonary emboli.  She was on long-term anticoagulation but is unable to give me details.  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  She was admitted to [facility] in [place].  Unfortunately I do not have actual records from there for review today she states she had some right-sided weakness and diminished fine motor skills and was off balance and barely able to walk.  She was transferred to inpatient rehab facility for 5 weeks as she gradually got better.  This stroke happened a few weeks after her son died suddenly.  Patient was apparently on anticoagulation for pulmonary embolism which was stopped after this episode of hemorrhagic stroke.  She patient was subsequently moved to [place] to be close to her other son.  She is currently moving with her son and his in-laws as she is no longer able to live alone.  She also had apparently seizures following her hemorrhagic stroke and was started on Keppra.  She was supposed to be on it only for short time and when she tried to wean herself off it she had tremulousness and jerking on the right side and Keppra was resumed thinking the episodes were seizures.  Patient with has also been complaining of numbness and tingling in the right hand particularly the  left fingers for quite some time.  She feels this may have gotten worse after her stroke.  She does complain of stiffness in the neck and restriction of neck movements to the right.  She has never been evaluated with MRI of the neck to see if she has radiculopathy or spinal stenosis. Update 09/16/2020 ; she returns for follow-up after last visit on 02/20/2020.  She missed her follow-up appointment in between due to excess exposure to COVID and trouble making earlier follow-up visits.  She is accompanied by her friend Anna Robbins today.  Patient continues to have some diminished strength in the right hand with trouble with fine motor skills and often drops objects.  This and difficulties seem to be more prominent in the night when she has paresthesias as well.  She states she is tried wearing carpal tunnel splints in the past but not recently and it did not help her.  She did undergo MRI scan cervical spine on 03/06/2020 which showed minor disc bulges but no definite cord compression or significant due to foraminal encroachment.  MRI scan of the brain was also done which showed old right frontal parasagittal hemorrhagic infarct with remote age blood products.  She denies any new symptoms of stroke or TIAs.  She remains on aspirin she is tolerating well without bruising or bleeding.  His blood pressure is usually well controlled today in office it is 147/77.  She has no other new complaints.  She does have restless leg syndrome and takes gabapentin  300 mg at night.  She has never tried a higher dose ROS:   14 system review of systems is positive for weight gain, fatigue,   bruising, feeling cold,  ringing in the ears, trouble swallowing,   joint pain, allergies, runny nose, confusion, I , restless legs,   dizziness, seizure, anxiety and depression, decreased energy and all other systems negative PMH:  Past Medical History:  Diagnosis Date   Asthma    Diabetes mellitus without complication (HCC)    Kidney stones 07/2017    required sx   Pulmonary emboli (HCC) 06/2017   Seizures (HCC)    Stroke [facility])     Social History:  Social History   Socioeconomic History   Marital status: Widowed    Spouse name: Not on file   Number of children: Not on file   Years of education: Not on file   Highest education level: Not on file  Occupational History   Not on file  Tobacco Use   Smoking status: Never   Smokeless tobacco: Never  Substance and Sexual Activity   Alcohol use: Not Currently   Drug use: Not Currently   Sexual activity: Not Currently  Other Topics Concern   Not on file  Social History Narrative   Lives with daughter and son in law and their 2 kids, also son in law's mother.   Left Handed   Driinks 1-2 cups caffeine weekly   Social Determinants of Health   Financial Resource Strain: Low Risk    Difficulty of Paying Living Expenses: Not hard at all  Food Insecurity: No Food Insecurity   Worried About Programme researcher, broadcasting/film/video in the Last Year: Never true   Barista in the Last Year: Never true  Transportation Needs: No Transportation Needs   Lack of Transportation (Medical): No   Lack of Transportation (Non-Medical): No  Physical Activity: Inactive   Days of Exercise per Week: 0 days   Minutes of Exercise per Session: 0 min  Stress: No Stress Concern Present   Feeling of Stress : Only a little  Social Connections: Moderately Isolated   Frequency of Communication with Friends and Family: More than three times a week   Frequency of Social Gatherings with Friends and Family: More than three times a week   Attends Religious Services: More than 4 times per year   Active Member of [employer] or Organizations: No   Attends Banker Meetings: Never   Marital Status: Widowed  Catering manager Violence: Not At Risk   Fear of Current or Ex-Partner: No   Emotionally Abused: No   Physically Abused: No   Sexually Abused: No    Medications:   Current Outpatient Medications on File  Prior to Visit  Medication Sig Dispense Refill   acetaminophen (TYLENOL) 500 MG tablet Take 500-1,000 mg by mouth every 6 (six) hours as needed.     albuterol (VENTOLIN HFA) 108 (90 Base) MCG/ACT inhaler Inhale into the lungs every 6 (six) hours as needed for wheezing or shortness of breath.     amLODipine (NORVASC) 5 MG tablet Take 1 tablet (5 mg total) by mouth daily. 90 tablet 1   aspirin EC 81 MG tablet Take 1 tablet (81 mg total) by mouth daily. Swallow whole. 30 tablet 11   DULoxetine (CYMBALTA) 20 MG capsule Take 1 capsule (20 mg total) by mouth daily. 90 capsule 1   glipiZIDE (GLUCOTROL) 5 MG tablet TAKE 1 TABLET(5 MG) BY MOUTH TWICE DAILY  60 tablet 2   Ketoconazole-Hydrocortisone 2-2.5 % CREA Apply 1 application topically daily. 30 g 0   levETIRAcetam (KEPPRA) 750 MG tablet Take 1 tablet (750 mg total) by mouth 2 (two) times daily. 180 tablet 1   loperamide (IMODIUM) 2 MG capsule Take 2 mg by mouth daily.     metFORMIN (GLUCOPHAGE) 1000 MG tablet Take 1 tablet (1,000 mg total) by mouth 2 (two) times daily with a meal. 180 tablet 1   nystatin (MYCOSTATIN/NYSTOP) powder Apply 1 application topically 3 (three) times daily. 45 g 1   oxycodone (OXY-IR) 5 MG capsule Take 5 mg by mouth every 4 (four) hours as needed.     simvastatin (ZOCOR) 5 MG tablet Take 1 tablet (5 mg total) by mouth daily. 90 tablet 1   Vibegron (GEMTESA) 75 MG TABS Take 1 tablet by mouth daily. 30 tablet 2   No current facility-administered medications on file prior to visit.    Allergies:   Allergies  Allergen Reactions   Acetaminophen    Asa [Aspirin]    Diphenhydramine Hcl    Lisinopril     cough   Melatonin    Molds & Smuts    Other    Oxycodone Hcl    Penicillin G    Penicillins    Tramadol     Physical Exam General: well developed, well nourished elderly Caucasian lady, seated, in no evident distress Head: head normocephalic and atraumatic.   Neck: supple with no carotid or supraclavicular  bruits Cardiovascular: regular rate and rhythm, no murmurs Musculoskeletal: Mild kyphoscoliosis Skin:  no rash/petichiae Vascular:  Normal pulses all extremities  Neurologic Exam Mental Status: Awake and fully alert. Oriented to place and time. Recent and remote memory intact. Attention span, concentration and fund of knowledge appropriate. Mood and affect appropriate.  Cranial Nerves: Fundoscopic exam not done.. Pupils equal, briskly reactive to light. Extraocular movements full without nystagmus. Visual fields full to confrontation. Hearing diminished bilaterally. Facial sensation intact. Face, tongue, palate moves normally and symmetrically.  Motor: Normal bulk and tone. Normal strength in all tested extremity muscles.  Mild weakness of intrinsic hand muscles in the right hand particularly over the ulnar fingers Sensory.: intact to touch , pinprick , position and vibratory sensation.  Except slight hyperesthesia over the tips of the ulnar mediated fingers in the right hand.  Positive Tinel's sign of the right wrist. Coordination: Rapid alternating movements normal in all extremities. Finger-to-nose and heel-to-shin performed accurately bilaterally. Gait and Station: Arises from chair with difficulty. Stance is stooped.. Uses a Rollator. Reflexes: 1+ and symmetric. Toes downgoing.   NIHSS 0 Modified Rankin  2   ASSESSMENT: 87 year old Caucasian lady with hemorrhagic stroke in October 2020 possibly hypertensive versus anticoagulation related  followed by symptomatic seizures.  Vascular risk factors of diabetes, hypertension, hyperlipidemia.  Chronic right hand paresthesias possibly from carpal tunnel     PLAN: I had a long discussion with the patient and her friend Anna Robbins regarding her chronic right hand paresthesias which likely from carpal tunnel syndrome.  I recommend she wear wrist extension splint times particularly at night and avoid activities which involve rapid repetitive wrist  flexion movements.  Check EMG nerve conduction study to confirm the diagnosis.  Increase gabapentin dose to 600 mg at night to help with both restless legs as well as and paresthesias.  I also reviewed the findings from MRI scan of the brain and cervical spine from last visit and answered questions.  She will stay on aspirin  81 mg daily for stroke prevention and maintain aggressive risk factor modification strict control of hypertension with blood pressure goal below 130/90, diabetes with hemoglobin A1c goal below 6.5% and lipids with LDL cholesterol goal below 70 mg percent.  She will return for follow-up in the future in 6 months with my nurse practitioner Shanda BumpsJessica or call earlier if necessary.Greater than 50% time during this 35-minute visit was it was spent on counseling and coordination of care about her hemorrhagic stroke and post stroke seizures and answering questions. Delia HeadyPramod Bartlett Enke, MD  [facility] [address] [place], [place] [id number]  Phone [phone] Fax [phone] Note: This document was prepared with digital dictation and possible smart phrase technology. Any transcriptional errors that result from this process are unintentional.

## 2020-09-16 NOTE — Patient Instructions (Signed)
CalledI had a long discussion with the patient and her friend [name] regarding her chronic right hand paresthesias which likely from carpal tunnel syndrome.  I recommend she wear wrist extension splint times particularly at night and avoid activities which involve rapid repetitive wrist flexion movements.  Check EMG nerve conduction study to confirm the diagnosis.  Increase gabapentin dose to 600 mg at night to help with both restless legs as well as and paresthesias.  I also reviewed the findings from MRI scan of the brain and cervical spine from last visit and answered questions.  She will stay on aspirin 81 mg daily for stroke prevention and maintain aggressive risk factor modification strict control of hypertension with blood pressure goal below 130/90, diabetes with hemoglobin A1c goal below 6.5% and lipids with LDL cholesterol goal below 70 mg percent.  She will return for follow-up in the future in 6 months with my nurse practitioner Shanda Bumps or call earlier if necessary. Carpal Tunnel Syndrome  Carpal tunnel syndrome is a condition that causes pain, weakness, and numbness in your hand and arm. Numbness is when you cannot feel an area in your body. The carpal tunnel is a narrow area that is on the palm side of your wrist. Repeated wrist motion or certain diseases may cause swelling in the tunnel. This swelling can pinch the main nerve in the wrist. This nerve is called themedian nerve. What are the causes? This condition may be caused by: Moving your hand and wrist over and over again while doing a task. Injury to the wrist. Arthritis. A sac of fluid (cyst) or abnormal growth (tumor) in the carpal tunnel. Fluid buildup during pregnancy. Use of tools that vibrate. Sometimes the cause is not known. What increases the risk? The following factors may make you more likely to have this condition: Having a job that makes you do these things: Move your hand over and over again. Work with tools that  vibrate, such as drills or sanders. Being a woman. Having diabetes, obesity, thyroid problems, or kidney failure. What are the signs or symptoms? Symptoms of this condition include: A tingling feeling in your fingers. Tingling or loss of feeling in your hand. Pain in your entire arm. This pain may get worse when you bend your wrist and elbow for a long time. Pain in your wrist that goes up your arm to your shoulder. Pain that goes down into your palm or fingers. Weakness in your hands. You may find it hard to grab and hold items. You may feel worse at night. How is this treated? This condition may be treated with: Lifestyle changes. You will be asked to stop or change the activity that caused your problem. Doing exercises and activities that make bones, muscles, and tendons stronger (physical therapy). Learning how to use your hand again (occupational therapy). Medicines for pain and swelling. You may have injections in your wrist. A wrist splint or brace. Surgery. Follow these instructions at home: If you have a splint or brace: Wear the splint or brace as told by your doctor. Take it off only as told by your doctor. Loosen the splint if your fingers: Tingle. Become numb. Turn cold and blue. Keep the splint or brace clean. If the splint or brace is not waterproof: Do not let it get wet. Cover it with a watertight covering when you take a bath or a shower. Managing pain, stiffness, and swelling If told, put ice on the painful area: If you have a removable splint  or brace, remove it as told by your doctor. Put ice in a plastic bag. Place a towel between your skin and the bag. Leave the ice on for 20 minutes, 2-3 times per day. Do not fall asleep with the cold pack on your skin. Take off the ice if your skin turns bright red. This is very important. If you cannot feel pain, heat, or cold, you have a greater risk of damage to the area. Move your fingers often to reduce stiffness  and swelling. General instructions Take over-the-counter and prescription medicines only as told by your doctor. Rest your wrist from any activity that may cause pain. If needed, talk with your boss at work about changes that can help your wrist heal. Do exercises as told by your doctor, physical therapist, or occupational therapist. Keep all follow-up visits. Contact a doctor if: You have new symptoms. Medicine does not help your pain. Your symptoms get worse. Get help right away if: You have very bad numbness or tingling in your wrist or hand. Summary Carpal tunnel syndrome is a condition that causes pain in your hand and arm. It is often caused by repeated wrist motions. Lifestyle changes and medicines are used to treat this problem. Surgery may help in very bad cases. Follow your doctor's instructions about wearing a splint, resting your wrist, keeping follow-up visits, and calling for help. This information is not intended to replace advice given to you by your health care provider. Make sure you discuss any questions you have with your healthcare provider. Document Revised: 05/25/2019 Document Reviewed: 05/25/2019 Elsevier Patient Education  2022 [employer].

## 2020-09-21 ENCOUNTER — Other Ambulatory Visit: Payer: Self-pay | Admitting: Internal Medicine

## 2020-09-21 DIAGNOSIS — E1165 Type 2 diabetes mellitus with hyperglycemia: Secondary | ICD-10-CM

## 2020-09-26 ENCOUNTER — Encounter: Payer: Medicare Other | Admitting: Diagnostic Neuroimaging

## 2020-10-25 ENCOUNTER — Encounter: Payer: Self-pay | Admitting: Nurse Practitioner

## 2020-10-25 ENCOUNTER — Ambulatory Visit (HOSPITAL_COMMUNITY)
Admission: RE | Admit: 2020-10-25 | Discharge: 2020-10-25 | Disposition: A | Discharge status: Home / Self Care | Payer: Medicare Other | Source: Ambulatory Visit | Attending: Nurse Practitioner | Admitting: Nurse Practitioner

## 2020-10-25 ENCOUNTER — Other Ambulatory Visit: Payer: Self-pay

## 2020-10-25 ENCOUNTER — Ambulatory Visit (INDEPENDENT_AMBULATORY_CARE_PROVIDER_SITE_OTHER): Payer: Medicare Other | Admitting: Nurse Practitioner

## 2020-10-25 ENCOUNTER — Ambulatory Visit: Payer: Medicare Other | Admitting: Family Medicine

## 2020-10-25 VITALS — BP 150/73 | HR 85 | Temp 98.8°F | Ht 65.0 in | Wt 203.0 lb

## 2020-10-25 DIAGNOSIS — E1165 Type 2 diabetes mellitus with hyperglycemia: Secondary | ICD-10-CM | POA: Diagnosis not present

## 2020-10-25 DIAGNOSIS — K649 Unspecified hemorrhoids: Secondary | ICD-10-CM | POA: Insufficient documentation

## 2020-10-25 DIAGNOSIS — E785 Hyperlipidemia, unspecified: Secondary | ICD-10-CM

## 2020-10-25 DIAGNOSIS — I209 Angina pectoris, unspecified: Secondary | ICD-10-CM

## 2020-10-25 DIAGNOSIS — R053 Chronic cough: Secondary | ICD-10-CM

## 2020-10-25 DIAGNOSIS — B372 Candidiasis of skin and nail: Secondary | ICD-10-CM | POA: Diagnosis not present

## 2020-10-25 MED ORDER — HYDROCORTISONE ACETATE 25 MG RE SUPP
25.0000 mg | Freq: Two times a day (BID) | RECTAL | 0 refills | Status: DC
Start: 2020-10-25 — End: 2022-07-16

## 2020-10-25 MED ORDER — NYSTATIN 100000 UNIT/GM EX POWD: 1.0000 "application " | Freq: Three times a day (TID) | CUTANEOUS | 1 refills | Status: DC

## 2020-10-25 NOTE — Patient Instructions (Signed)
You had multiple acute and ongoing concerns today.  To address chronic health conditions we will need to set up another visit after you have had labs drawn.  Please have fasting labs drawn 2-3 days prior to your appointment so we can discuss the results during your office visit.

## 2020-10-25 NOTE — Assessment & Plan Note (Signed)
-  to [facility]; no issues under breast today -Rx. Nystatin powder

## 2020-10-25 NOTE — Progress Notes (Signed)
Acute Office Visit  Subjective:    Patient ID: Anna Robbins, female    DOB: Feb 16, 1932, 87 y.o.   MRN: [id number]  Chief Complaint  Patient presents with   Follow-up    cough started dry now wet a lot of phlegm,chest pain last night for about 2 hours and went away tenderness this morning, hemorrhoids flared up wants suppository, possible yeast infection in skin flaps, nose runs when eating and diarrhea     HPI Patient is in today for cough that started 2 years ago, and it is getting progressively worse. She has phlegm that she swallows and that causes dyspepsia and soft stools.  She has hemorrhoids, and those are painful today. Her son made food that was too spicy, and she would like a suppository.  She had a rash under her breasts. She tried a cream and that helped. She now has a rash under her pannis.  She had some chest pain last night, but no current chest pain today.  Past Medical History:  Diagnosis Date   Asthma    Diabetes mellitus without complication ([place])    Kidney stones 07/2017   required sx   Pulmonary emboli ([place]) 06/2017   Seizures ([place])    Stroke [facility])     Past Surgical History:  Procedure Laterality Date   ABDOMINAL HYSTERECTOMY     APPENDECTOMY     thumb surgery     TONSILLECTOMY      History reviewed. No pertinent family history.  Social History   Socioeconomic History   Marital status: Widowed    Spouse name: Not on file   Number of children: Not on file   Years of education: Not on file   Highest education level: Not on file  Occupational History   Not on file  Tobacco Use   Smoking status: Never   Smokeless tobacco: Never  Substance and Sexual Activity   Alcohol use: Not Currently   Drug use: Not Currently   Sexual activity: Not Currently  Other Topics Concern   Not on file  Social History Narrative   Lives with daughter and son in law and their 2 kids, also son in [place] mother.   Left Handed   Driinks 1-2 cups caffeine  weekly   Social Determinants of Health   Financial Resource Strain: Low Risk    Difficulty of Paying Living Expenses: Not hard at all  Food Insecurity: No Food Insecurity   Worried About Charity fundraiser in the Last Year: Never true   Arboriculturist in the Last Year: Never true  Transportation Needs: No Transportation Needs   Lack of Transportation (Medical): No   Lack of Transportation (Non-Medical): No  Physical Activity: Inactive   Days of Exercise per Week: 0 days   Minutes of Exercise per Session: 0 min  Stress: No Stress Concern Present   Feeling of Stress : Only a little  Social Connections: Moderately Isolated   Frequency of Communication with Friends and Family: More than three times a week   Frequency of Social Gatherings with Friends and Family: More than three times a week   Attends Religious Services: More than 4 times per year   Active Member of [employer] or Organizations: No   Attends Archivist Meetings: Never   Marital Status: Widowed  Intimate Partner Violence: Not At Risk   Fear of Current or Ex-Partner: No   Emotionally Abused: No   Physically Abused: No   Sexually Abused:  No    Outpatient Medications Prior to Visit  Medication Sig Dispense Refill   acetaminophen (TYLENOL) 500 MG tablet Take 500-1,000 mg by mouth every 6 (six) hours as needed.     albuterol (VENTOLIN HFA) 108 (90 Base) MCG/ACT inhaler Inhale into the lungs every 6 (six) hours as needed for wheezing or shortness of breath.     amLODipine (NORVASC) 5 MG tablet Take 1 tablet (5 mg total) by mouth daily. 90 tablet 1   aspirin EC 81 MG tablet Take 1 tablet (81 mg total) by mouth daily. Swallow whole. 30 tablet 11   DULoxetine (CYMBALTA) 20 MG capsule Take 1 capsule (20 mg total) by mouth daily. 90 capsule 1   gabapentin (NEURONTIN) 300 MG capsule Take 2 capsules (600 mg total) by mouth at bedtime. 90 capsule 1   glipiZIDE (GLUCOTROL) 5 MG tablet TAKE 1 TABLET(5 MG) BY MOUTH TWICE DAILY  60 tablet 2   Ketoconazole-Hydrocortisone 2-2.5 % CREA Apply 1 application topically daily. 30 g 0   levETIRAcetam (KEPPRA) 750 MG tablet Take 1 tablet (750 mg total) by mouth 2 (two) times daily. 180 tablet 1   loperamide (IMODIUM) 2 MG capsule Take 2 mg by mouth daily.     metFORMIN (GLUCOPHAGE) 1000 MG tablet Take 1 tablet (1,000 mg total) by mouth 2 (two) times daily with a meal. 180 tablet 1   oxycodone (OXY-IR) 5 MG capsule Take 5 mg by mouth every 4 (four) hours as needed.     simvastatin (ZOCOR) 5 MG tablet Take 1 tablet (5 mg total) by mouth daily. 90 tablet 1   Vibegron (GEMTESA) 75 MG TABS Take 1 tablet by mouth daily. 30 tablet 2   nystatin (MYCOSTATIN/NYSTOP) powder Apply 1 application topically 3 (three) times daily. 45 g 1   No facility-administered medications prior to visit.    Allergies  Allergen Reactions   Acetaminophen    Asa [Aspirin]    Diphenhydramine Hcl    Lisinopril     cough   Melatonin    Molds & Smuts    Nsaids    Other    Oxycodone Hcl    Penicillin G    Penicillins    Tramadol     Review of Systems  Constitutional: Negative.   Respiratory:  Positive for cough. Negative for shortness of breath and wheezing.   Cardiovascular:  Positive for chest pain.       Intermittent; no chest pain while in-office today  Gastrointestinal:  Positive for rectal pain.  Psychiatric/Behavioral: Negative.        Objective:    Physical Exam Constitutional:      Appearance: Normal appearance. She is obese.  Cardiovascular:     Rate and Rhythm: Normal rate and regular rhythm.     Pulses: Normal pulses.     Heart sounds: Normal heart sounds.  Pulmonary:     Effort: Pulmonary effort is normal.     Breath sounds: Normal breath sounds.  Skin:    Comments: Signs of itching under pannis; large skin tag under pannis  Neurological:     Mental Status: She is alert.  Psychiatric:        Mood and Affect: Mood normal.        Behavior: Behavior normal.         Thought Content: Thought content normal.        Judgment: Judgment normal.    BP (!) 150/73 (BP Location: Right Arm, Patient Position: Sitting, Cuff Size: Large)  Pulse 85   Temp 98.8 F (37.1 C) (Oral)   Ht 5' 5"  (1.651 m)   Wt 203 lb (92.1 kg)   SpO2 92%   BMI 33.78 kg/m  Wt Readings from Last 3 Encounters:  10/25/20 203 lb (92.1 kg)  09/16/20 209 lb 8 oz (95 kg)  07/08/20 208 lb (94.3 kg)    Health Maintenance Due  Topic Date Due   FOOT EXAM  Never done   OPHTHALMOLOGY EXAM  Never done   URINE MICROALBUMIN  Never done   TETANUS/TDAP  Never done   Zoster Vaccines- Shingrix (1 of 2) Never done   COVID-19 Vaccine (3 - Booster for Moderna series) 09/19/2019   HEMOGLOBIN A1C  08/19/2020   INFLUENZA VACCINE  Never done    There are no preventive care reminders to display for this patient.   No results found for: TSH No results found for: WBC, HGB, HCT, MCV, PLT No results found for: NA, K, CHLORIDE, CO2, GLUCOSE, BUN, CREATININE, BILITOT, ALKPHOS, AST, ALT, PROT, ALBUMIN, CALCIUM, ANIONGAP, EGFR, GFR Lab Results  Component Value Date   CHOL 150 02/20/2020   Lab Results  Component Value Date   HDL 50 02/20/2020   Lab Results  Component Value Date   LDLCALC 75 02/20/2020   Lab Results  Component Value Date   TRIG 144 02/20/2020   Lab Results  Component Value Date   CHOLHDL 3.0 02/20/2020   Lab Results  Component Value Date   HGBA1C 6.5 (H) 02/20/2020       Assessment & Plan:   Problem List Items Addressed This Visit       Cardiovascular and Mediastinum   Hemorrhoid    -hx of internal hemorrhoids -Rx. HC suppository -if no improvement, will consider GI or surgical referral      Relevant Medications   hydrocortisone (ANUSOL-HC) 25 MG suppository   Angina pectoris ([place])    -unsure of etiology; no acute issues today -referral to cardiology       Relevant Orders   Ambulatory referral to Cardiology     Endocrine   Type 2 diabetes mellitus  with hyperglycemia, without long-term current use of insulin ([place]) - Primary    -check labs today      Relevant Orders   CBC with Differential/Platelet   Lipid Panel With LDL/HDL Ratio   CMP14+EGFR   Hemoglobin A1c     Musculoskeletal and Integument   Yeast dermatitis    -to pannis; no issues under breast today -Rx. Nystatin powder      Relevant Medications   nystatin (MYCOSTATIN/NYSTOP) powder     Other   Hyperlipidemia    -checking labs      Relevant Orders   CBC with Differential/Platelet   Lipid Panel With LDL/HDL Ratio   CMP14+EGFR   Cough, persistent    -CXR today -not likely from acute illness -referral pulm      Relevant Orders   Ambulatory referral to Pulmonology   DG Chest 2 View     Meds ordered this encounter  Medications   nystatin (MYCOSTATIN/NYSTOP) powder    Sig: Apply 1 application topically 3 (three) times daily.    Dispense:  45 g    Refill:  1   hydrocortisone (ANUSOL-HC) 25 MG suppository    Sig: Place 1 suppository (25 mg total) rectally 2 (two) times daily.    Dispense:  12 suppository    Refill:  0      Noreene Larsson, NP

## 2020-10-25 NOTE — Assessment & Plan Note (Signed)
-  hx of internal hemorrhoids -Rx. HC suppository -if no improvement, will consider GI or surgical referral

## 2020-10-25 NOTE — Assessment & Plan Note (Signed)
-  checking labs 

## 2020-10-25 NOTE — Assessment & Plan Note (Signed)
-  unsure of etiology; no acute issues today -referral to cardiology

## 2020-10-25 NOTE — Assessment & Plan Note (Signed)
-   check labs today 

## 2020-10-25 NOTE — Assessment & Plan Note (Signed)
-  CXR today -not likely from acute illness -referral pulm

## 2020-10-28 NOTE — Progress Notes (Signed)
CXR is negative 

## 2020-10-31 ENCOUNTER — Ambulatory Visit (INDEPENDENT_AMBULATORY_CARE_PROVIDER_SITE_OTHER): Payer: Medicare Other | Admitting: Diagnostic Neuroimaging

## 2020-10-31 ENCOUNTER — Telehealth: Payer: Self-pay | Admitting: Neurology

## 2020-10-31 ENCOUNTER — Encounter: Payer: Medicare Other | Admitting: Diagnostic Neuroimaging

## 2020-10-31 ENCOUNTER — Other Ambulatory Visit: Payer: Self-pay

## 2020-10-31 DIAGNOSIS — G5601 Carpal tunnel syndrome, right upper limb: Secondary | ICD-10-CM

## 2020-10-31 DIAGNOSIS — Z0289 Encounter for other administrative examinations: Secondary | ICD-10-CM

## 2020-10-31 NOTE — Telephone Encounter (Signed)
Anna Robbins (friend)  is asking for a call back from Anna Robbins, [place] as she feels pt is showing increasing signs of likely having dementia.

## 2020-10-31 NOTE — Telephone Encounter (Signed)
The patient was last seen 09/16/20. Hx of stroke in October 2020, seizures, possibly carpal tunnel syndrome. NCV/EMG pending. Memory decline was not discussed at the visit. Left message for Marvell Fuller (friend) to return my call. Need further information about concerns. If non-urgent and slow decline, we can certainly complete memory screening at her next appt. If new, sudden onset, the patient may need to seek sooner care with PCP.

## 2020-10-31 NOTE — Telephone Encounter (Signed)
Patient came in for a NVG today with [name] who is a friend. [name] has some concerns about Tris's memory and not sure if it was stroke related. Patients next app is February with Shanda Bumps so if Shanda Bumps or a nurse can call her to discuss her concerns she would appreciate it. She isn't sure if she should request an early appt since she was just seen by Dr. Pearlean Brownie and the visit was related more nerve related. Thank you

## 2020-10-31 NOTE — Telephone Encounter (Signed)
Returned the call to [employer] Reports a slow decline in memory. Says the patient is still able to care for herself. She would like to have it further reviewed at that time.

## 2020-10-31 NOTE — Procedures (Signed)
[facility]  NCS (NERVE CONDUCTION STUDY) WITH EMG (ELECTROMYOGRAPHY) REPORT   STUDY DATE: 10/31/2020 PATIENT NAME: Anna Robbins DOB: [birth year] MRN: [id number]  ORDERING CLINICIAN: Delia Heady, MD   TECHNOLOGIST: Durenda Age ELECTROMYOGRAPHER: Glenford Bayley. Margareth Kanner, MD  CLINICAL INFORMATION: 87 year old female with right hand numbness.  Abnormal MRI cervical spine.  Evaluate for carpal tunnel syndrome versus cervical radiculopathies.   FINDINGS: NERVE CONDUCTION STUDY:  Right median motor responses prolonged distal latency (8.5 ms) decreased amplitude and normal conduction velocity.  Left median motor response has prolonged distal latency, decreased amplitude and normal conduction velocity.  Right ulnar motor response has decreased amplitude and normal conduction velocity.  Right ulnar F-wave latency is slightly prolonged.  Right median and right ulnar sensory responses could not be obtained.  Right radial sensory response is normal.  Left median sensory response has prolonged peak latency and decreased amplitude.   NEEDLE ELECTROMYOGRAPHY:  Needle examination of right upper extremity shows decreased recruitment of large motor units in the right triceps and right first dorsal interosseous muscles.  Right cervical paraspinal muscles and other muscles of right upper extremity are unremarkable.   IMPRESSION:   Abnormal study demonstrating: -Moderate to severe right median neuropathy at the wrist consistent with moderate to severe right carpal tunnel syndrome. -Mild left median neuropathy at the wrist consistent with mild left carpal tunnel syndrome (asymptomatic).  -Mild to moderate right ulnar neuropathy, not further localized. -Chronic denervation in right triceps and right first dorsal interosseous muscles may be related to underlying cervical radiculopathies.    INTERPRETING PHYSICIAN:  Suanne Marker, MD Certified in Neurology,  Neurophysiology and Neuroimaging  [facility] [address], [address] [place], [place] [address] [phone]  [facility]    Nerve / Sites Muscle Latency Ref. Amplitude Ref. Rel Amp Segments Distance Velocity Ref. Area    ms ms mV mV %  cm m/s m/s mVms  R Median - APB     Wrist APB 8.5 ?4.4 3.6 ?4.0 100 Wrist - APB 7   14.7     Upper arm APB 11.9  3.5  97 Upper arm - Wrist 18 54 ?49 13.1  L Median - APB     Wrist APB 5.1 ?4.4 3.9 ?4.0 100 Wrist - APB 7   12.4     Upper arm APB 9.1  3.7  95 Upper arm - Wrist 19 49 ?49 11.9  R Ulnar - ADM     Wrist ADM 2.9 ?3.3 5.2 ?6.0 100 Wrist - ADM 7   17.4     B.Elbow ADM 5.9  4.1  78.7 B.Elbow - Wrist 18 60 ?49 12.9     A.Elbow ADM 7.7  4.3  104 A.Elbow - B.Elbow 10 56 ?49 15.2           SNC    Nerve / Sites Rec. Site Peak Lat Ref.  Amp Ref. Segments Distance    ms ms V V  cm  R Radial - Anatomical snuff box (Forearm)     Forearm Wrist 2.3 ?2.9 16 ?15 Forearm - Wrist 10  R Median - Orthodromic (Dig II, Mid palm)     Dig II Wrist NR ?3.4 NR ?10 Dig II - Wrist 13  L Median - Orthodromic (Dig II, Mid palm)     Dig II Wrist 4.1 ?3.4 7 ?10 Dig II - Wrist 13  R Ulnar - Orthodromic, (Dig V, Mid palm)     Dig V Wrist NR ?3.1 NR ?  5 Dig V - Wrist 24             F  Wave    Nerve F Lat Ref.   ms ms  R Ulnar - ADM 33.1 ?32.0       EMG Summary Table    Spontaneous MUAP Recruitment  Muscle IA Fib PSW Fasc Other Amp Dur. Poly Pattern  R. Deltoid Normal None None None _______ Normal Normal Normal Normal  R. Biceps brachii Normal None None None _______ Normal Normal Normal Normal  R. Triceps brachii Normal None None None _______ Increased Normal Normal Reduced  R. Flexor carpi radialis Normal None None None _______ Normal Normal Normal Normal  R. First dorsal interosseous Normal None None None _______ Increased Normal Normal Reduced  R. Cervical paraspinals Normal None None None _______ Normal Normal Normal Normal

## 2020-11-06 DIAGNOSIS — E785 Hyperlipidemia, unspecified: Secondary | ICD-10-CM | POA: Diagnosis not present

## 2020-11-06 DIAGNOSIS — E1165 Type 2 diabetes mellitus with hyperglycemia: Secondary | ICD-10-CM | POA: Diagnosis not present

## 2020-11-07 LAB — CMP14+EGFR
ALT: 8 IU/L (ref 0–32)
AST: 13 IU/L (ref 0–40)
Albumin/Globulin Ratio: 2.5 — ABNORMAL HIGH (ref 1.2–2.2)
Albumin: 4.7 g/dL — ABNORMAL HIGH (ref 3.6–4.6)
Alkaline Phosphatase: 49 IU/L (ref 44–121)
BUN/Creatinine Ratio: 23 (ref 12–28)
BUN: 20 mg/dL (ref 8–27)
Bilirubin Total: 0.4 mg/dL (ref 0.0–1.2)
CO2: 23 mmol/L (ref 20–29)
Calcium: 9.9 mg/dL (ref 8.7–10.3)
Chloride: 101 mmol/L (ref 96–106)
Creatinine, Ser: 0.88 mg/dL (ref 0.57–1.00)
Globulin, Total: 1.9 g/dL (ref 1.5–4.5)
Glucose: 194 mg/dL — ABNORMAL HIGH (ref 70–99)
Potassium: 4.6 mmol/L (ref 3.5–5.2)
Sodium: 138 mmol/L (ref 134–144)
Total Protein: 6.6 g/dL (ref 6.0–8.5)
eGFR: 64 mL/min/{1.73_m2} (ref >59)

## 2020-11-07 LAB — CBC WITH DIFFERENTIAL/PLATELET
Basophils Absolute: 0 10*3/uL (ref 0.0–0.2)
Basos: 1 %
EOS (ABSOLUTE): 0.2 10*3/uL (ref 0.0–0.4)
Eos: 4 %
Hematocrit: 36.2 % (ref 34.0–46.6)
Hemoglobin: 11.6 g/dL (ref 11.1–15.9)
Immature Grans (Abs): 0 10*3/uL (ref 0.0–0.1)
Immature Granulocytes: 0 %
Lymphocytes Absolute: 1.4 10*3/uL (ref 0.7–3.1)
Lymphs: 28 %
MCH: 29.3 pg (ref 26.6–33.0)
MCHC: 32 g/dL (ref 31.5–35.7)
MCV: 91 fL (ref 79–97)
Monocytes Absolute: 0.3 10*3/uL (ref 0.1–0.9)
Monocytes: 6 %
Neutrophils Absolute: 3.1 10*3/uL (ref 1.4–7.0)
Neutrophils: 61 %
Platelets: 228 10*3/uL (ref 150–450)
RBC: 3.96 x10E6/uL (ref 3.77–5.28)
RDW: 12.4 % (ref 11.7–15.4)
WBC: 5 10*3/uL (ref 3.4–10.8)

## 2020-11-07 LAB — HEMOGLOBIN A1C
Est. average glucose Bld gHb Est-mCnc: 143 mg/dL
Hgb A1c MFr Bld: 6.6 % — ABNORMAL HIGH (ref 4.8–5.6)

## 2020-11-07 LAB — LIPID PANEL WITH LDL/HDL RATIO
Cholesterol, Total: 128 mg/dL (ref 100–199)
HDL: 55 mg/dL (ref >39)
LDL Chol Calc (NIH): 54 mg/dL (ref 0–99)
LDL/HDL Ratio: 1 ratio (ref 0.0–3.2)
Triglycerides: 103 mg/dL (ref 0–149)
VLDL Cholesterol Cal: 19 mg/dL (ref 5–40)

## 2020-11-11 DIAGNOSIS — H01004 Unspecified blepharitis left upper eyelid: Secondary | ICD-10-CM | POA: Diagnosis not present

## 2020-11-11 DIAGNOSIS — H353131 Nonexudative age-related macular degeneration, bilateral, early dry stage: Secondary | ICD-10-CM | POA: Diagnosis not present

## 2020-11-11 DIAGNOSIS — H01002 Unspecified blepharitis right lower eyelid: Secondary | ICD-10-CM | POA: Diagnosis not present

## 2020-11-11 DIAGNOSIS — H524 Presbyopia: Secondary | ICD-10-CM | POA: Diagnosis not present

## 2020-11-11 DIAGNOSIS — H01001 Unspecified blepharitis right upper eyelid: Secondary | ICD-10-CM | POA: Diagnosis not present

## 2020-11-12 ENCOUNTER — Ambulatory Visit: Payer: Medicare Other | Admitting: Internal Medicine

## 2020-11-12 ENCOUNTER — Telehealth: Payer: Self-pay

## 2020-11-12 ENCOUNTER — Telehealth: Payer: Self-pay | Admitting: Internal Medicine

## 2020-11-12 NOTE — Telephone Encounter (Signed)
-----   Message from [name], MD sent at 11/12/2020 12:54 PM EDT ----- Anna Robbins inform the patient that EMG nerve conduction studies confirm bilateral carpal tunnel that is pinched nerve at the wrist more severe on the right than the left.  Advised the patient to continue wearing wrist extension splints and use gabapentin for paresthesias.  If patient's symptoms are getting worse I may need to refer her for surgery if she is willing ----- Message ----- From: Suanne Marker, MD Sent: 10/31/2020   6:00 PM EDT To: [name], MD

## 2020-11-12 NOTE — Telephone Encounter (Signed)
I called patient, spoke with patient's friend, [name], per [facility]. I discussed patient's EMG/NCV results and recommendations. Patient is doing well with the wrist splints and gabapentin. Patient will follow up as scheduled. Patient's friend, [name], verbalized understanding of results.

## 2020-11-12 NOTE — Telephone Encounter (Signed)
Please call the pt and speak with [name] with lab results

## 2020-11-13 ENCOUNTER — Other Ambulatory Visit: Payer: Self-pay | Admitting: Neurology

## 2020-11-13 NOTE — Telephone Encounter (Signed)
Attempted call line was busy

## 2020-11-13 NOTE — Telephone Encounter (Signed)
Attempted to call pt line was busy

## 2020-12-09 NOTE — Progress Notes (Deleted)
Cardiology Office Note:    Date:  12/09/2020   ID:  Anna Robbins, DOB 04-Jan-1933, MRN [id number]  PCP:  No primary care provider on file.  Cardiologist:  None  Electrophysiologist:  None   Referring MD: Heather Roberts, NP   No chief complaint on file. ***  History of Present Illness:    Anna Robbins is a 87 y.o. female with a hx of asthma, T2DM, PE, CVA, seizures who is referred by Mylo Red, NP for evaluation of chest pain.  Past Medical History:  Diagnosis Date   Asthma    Diabetes mellitus without complication ([facility])    Kidney stones 07/2017   required sx   Pulmonary emboli ([facility]) 06/2017   Seizures ([facility])    Stroke [facility])     Past Surgical History:  Procedure Laterality Date   ABDOMINAL HYSTERECTOMY     APPENDECTOMY     thumb surgery     TONSILLECTOMY      Current Medications: No outpatient medications have been marked as taking for the 12/12/20 encounter (Appointment) with Little Ishikawa, MD.     Allergies:   Acetaminophen, Asa [aspirin], Diphenhydramine hcl, Lisinopril, Melatonin, Molds & smuts, Nsaids, Other, Oxycodone hcl, Penicillin g, Penicillins, and Tramadol   Social History   Socioeconomic History   Marital status: Widowed    Spouse name: Not on file   Number of children: Not on file   Years of education: Not on file   Highest education level: Not on file  Occupational History   Not on file  Tobacco Use   Smoking status: Never   Smokeless tobacco: Never  Substance and Sexual Activity   Alcohol use: Not Currently   Drug use: Not Currently   Sexual activity: Not Currently  Other Topics Concern   Not on file  Social History Narrative   Lives with daughter and son in law and their 2 kids, also son in law's mother.   Left Handed   Driinks 1-2 cups caffeine weekly   Social Determinants of Health   Financial Resource Strain: Low Risk    Difficulty of Paying Living Expenses: Not hard at all  Food Insecurity: No Food  Insecurity   Worried About Programme researcher, broadcasting/film/video in the Last Year: Never true   Barista in the Last Year: Never true  Transportation Needs: No Transportation Needs   Lack of Transportation (Medical): No   Lack of Transportation (Non-Medical): No  Physical Activity: Inactive   Days of Exercise per Week: 0 days   Minutes of Exercise per Session: 0 min  Stress: No Stress Concern Present   Feeling of Stress : Only a little  Social Connections: Moderately Isolated   Frequency of Communication with Friends and Family: More than three times a week   Frequency of Social Gatherings with Friends and Family: More than three times a week   Attends Religious Services: More than 4 times per year   Active Member of [employer] or Organizations: No   Attends Banker Meetings: Never   Marital Status: Widowed     Family History: The patient's ***family history is not on file.  ROS:   Please see the history of present illness.    *** All other systems reviewed and are negative.  EKGs/Labs/Other Studies Reviewed:    The following studies were reviewed today: ***  EKG:  EKG is *** ordered today.  The ekg ordered today demonstrates ***  Recent Labs: 11/06/2020: ALT 8; BUN  20; Creatinine, Ser 0.88; Hemoglobin 11.6; Platelets 228; Potassium 4.6; Sodium 138  Recent Lipid Panel    Component Value Date/Time   CHOL 128 11/06/2020 1142   TRIG 103 11/06/2020 1142   HDL 55 11/06/2020 1142   CHOLHDL 3.0 02/20/2020 1105   LDLCALC 54 11/06/2020 1142    Physical Exam:    VS:  There were no vitals taken for this visit.    Wt Readings from Last 3 Encounters:  10/25/20 203 lb (92.1 kg)  09/16/20 209 lb 8 oz (95 kg)  07/08/20 208 lb (94.3 kg)     GEN: *** Well nourished, well developed in no acute distress HEENT: Normal NECK: No JVD; No carotid bruits LYMPHATICS: No lymphadenopathy CARDIAC: ***RRR, no murmurs, rubs, gallops RESPIRATORY:  Clear to auscultation without rales,  wheezing or rhonchi  ABDOMEN: Soft, non-tender, non-distended MUSCULOSKELETAL:  No edema; No deformity  SKIN: Warm and dry NEUROLOGIC:  Alert and oriented x 3 PSYCHIATRIC:  Normal affect   ASSESSMENT:    No diagnosis found. PLAN:    Chest pain:  PE: Previously was on anticoagulation, stopped following hemorrhagic stroke  CVA: Developed hemorrhagic stroke in [place] while on anticoagulation for PE, which was subsequently stopped.  Follows with Dr. Pearlean Brownie in neurology.   Hypertension: On amlodipine 5 mg daily  T2DM: On glipizide, metformin  Hyperlipidemia: On simvastatin 5 mg daily  RTC in***   Medication Adjustments/Labs and Tests Ordered: Current medicines are reviewed at length with the patient today.  Concerns regarding medicines are outlined above.  No orders of the defined types were placed in this encounter.  No orders of the defined types were placed in this encounter.   There are no Patient Instructions on file for this visit.   Signed, Little Ishikawa, MD  12/09/2020 10:40 AM    [facility]

## 2020-12-12 ENCOUNTER — Ambulatory Visit: Payer: Medicare Other | Admitting: Cardiology

## 2020-12-15 NOTE — Progress Notes (Deleted)
Cardiology Office Note:    Date:  12/15/2020   ID:  Anna Robbins, DOB [birth year], MRN [id number]  PCP:  No primary care provider on file.   [facility] Providers Cardiologist:  None { }    Referring MD: Marlinda Miranda Roberts, NP     History of Present Illness:    Anna Robbins is a 87 y.o. female with a hx of HTN, DMII, prior CVA, history of PE, and seizures who was referred by Bjorn Pippin, NP for further evaluation of chest pain.  Today ***  Past Medical History:  Diagnosis Date   Asthma    Diabetes mellitus without complication (HCC)    Kidney stones 07/2017   required sx   Pulmonary emboli (HCC) 06/2017   Seizures (HCC)    Stroke [facility])     Past Surgical History:  Procedure Laterality Date   ABDOMINAL HYSTERECTOMY     APPENDECTOMY     thumb surgery     TONSILLECTOMY      Current Medications: No outpatient medications have been marked as taking for the 12/24/20 encounter (Appointment) with Meriam Sprague, MD.     Allergies:   Acetaminophen, Asa [aspirin], Diphenhydramine hcl, Lisinopril, Melatonin, Molds & smuts, Nsaids, Other, Oxycodone hcl, Penicillin g, Penicillins, and Tramadol   Social History   Socioeconomic History   Marital status: Widowed    Spouse name: Not on file   Number of children: Not on file   Years of education: Not on file   Highest education level: Not on file  Occupational History   Not on file  Tobacco Use   Smoking status: Never   Smokeless tobacco: Never  Substance and Sexual Activity   Alcohol use: Not Currently   Drug use: Not Currently   Sexual activity: Not Currently  Other Topics Concern   Not on file  Social History Narrative   Lives with daughter and son in law and their 2 kids, also son in law's mother.   Left Handed   Driinks 1-2 cups caffeine weekly   Social Determinants of Health   Financial Resource Strain: Low Risk    Difficulty of Paying Living Expenses: Not hard at all  Food Insecurity: No Food  Insecurity   Worried About Programme researcher, broadcasting/film/video in the Last Year: Never true   Barista in the Last Year: Never true  Transportation Needs: No Transportation Needs   Lack of Transportation (Medical): No   Lack of Transportation (Non-Medical): No  Physical Activity: Inactive   Days of Exercise per Week: 0 days   Minutes of Exercise per Session: 0 min  Stress: No Stress Concern Present   Feeling of Stress : Only a little  Social Connections: Moderately Isolated   Frequency of Communication with Friends and Family: More than three times a week   Frequency of Social Gatherings with Friends and Family: More than three times a week   Attends Religious Services: More than 4 times per year   Active Member of [employer] or Organizations: No   Attends Banker Meetings: Never   Marital Status: Widowed     Family History: The patient's ***family history is not on file.  ROS:   Please see the history of present illness.    *** All other systems reviewed and are negative.  EKGs/Labs/Other Studies Reviewed:    The following studies were reviewed today: ***  EKG:  EKG is *** ordered today.  The ekg ordered today demonstrates ***  Recent Labs: 11/06/2020: ALT 8; BUN 20; Creatinine, Ser 0.88; Hemoglobin 11.6; Platelets 228; Potassium 4.6; Sodium 138  Recent Lipid Panel    Component Value Date/Time   CHOL 128 11/06/2020 1142   TRIG 103 11/06/2020 1142   HDL 55 11/06/2020 1142   CHOLHDL 3.0 02/20/2020 1105   LDLCALC 54 11/06/2020 1142     Risk Assessment/Calculations:   {Does this patient have ATRIAL FIBRILLATION?:[phone]}       Physical Exam:    VS:  There were no vitals taken for this visit.    Wt Readings from Last 3 Encounters:  10/25/20 203 lb (92.1 kg)  09/16/20 209 lb 8 oz (95 kg)  07/08/20 208 lb (94.3 kg)     GEN: *** Well nourished, well developed in no acute distress HEENT: Normal NECK: No JVD; No carotid bruits LYMPHATICS: No  lymphadenopathy CARDIAC: ***RRR, no murmurs, rubs, gallops RESPIRATORY:  Clear to auscultation without rales, wheezing or rhonchi  ABDOMEN: Soft, non-tender, non-distended MUSCULOSKELETAL:  No edema; No deformity  SKIN: Warm and dry NEUROLOGIC:  Alert and oriented x 3 PSYCHIATRIC:  Normal affect   ASSESSMENT:    No diagnosis found. PLAN:    In order of problems listed above:  #Chest Pain: -Check myoview -TTE -Lipid profile -Continue ASA 81mg  daily -Change simva to crestor 20mg  daily  #HLD: -Change simva to crestor 20mg  daily -Lipid panel  #HTN: -Continue amlodipine 5mg  daily  #DMII: -Managed by PCP      {Are you ordering a CV Procedure (e.g. stress test, cath, DCCV, TEE, etc)?   Press F2        :    Medication Adjustments/Labs and Tests Ordered: Current medicines are reviewed at length with the patient today.  Concerns regarding medicines are outlined above.  No orders of the defined types were placed in this encounter.  No orders of the defined types were placed in this encounter.   There are no Patient Instructions on file for this visit.   Signed, , MD  12/15/2020 5:05 PM    [facility]

## 2020-12-17 ENCOUNTER — Other Ambulatory Visit: Payer: Self-pay

## 2020-12-17 ENCOUNTER — Encounter: Payer: Self-pay | Admitting: Internal Medicine

## 2020-12-17 ENCOUNTER — Other Ambulatory Visit: Payer: Self-pay | Admitting: Internal Medicine

## 2020-12-17 ENCOUNTER — Ambulatory Visit: Payer: Medicare Other | Admitting: Internal Medicine

## 2020-12-17 DIAGNOSIS — J45991 Cough variant asthma: Secondary | ICD-10-CM | POA: Diagnosis not present

## 2020-12-17 DIAGNOSIS — R0609 Other forms of dyspnea: Secondary | ICD-10-CM | POA: Diagnosis not present

## 2020-12-17 MED ORDER — PANTOPRAZOLE SODIUM 40 MG PO TBEC: 40.0000 mg | DELAYED_RELEASE_TABLET | Freq: Every day | ORAL | 2 refills | Status: DC

## 2020-12-17 MED ORDER — FAMOTIDINE 20 MG PO TABS: ORAL_TABLET | ORAL | 11 refills | Status: DC

## 2020-12-17 NOTE — Assessment & Plan Note (Signed)
Onset ? In childhood, better on "inhalers" but on 1st pulmonary eval 12/17/2020 more limited by residual from R frontal cva -  12/17/2020   Walked on RA x  3  lap(s) =  approx 447ft @ slow /walker pace, stopped due to end of study s sob  with lowest 02 sats 99%    No evidence of a cardiopulmonary mechanism limiting activity tol at this point and main issue appears to be deconditioning > already getting home PT  F/u can be prn  Each maintenance medication was reviewed in detail including emphasizing most importantly the difference between maintenance and prns and under what circumstances the prns are to be triggered using an action plan format where appropriate.  Total time for H and P, chart review, counseling,  directly observing portions of ambulatory 02 saturation study/ and generating customized AVS unique to this new pt office visit / same day charting =60 min

## 2020-12-17 NOTE — Progress Notes (Addendum)
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  Stroke oct 2020 >   Swallowing eval was done [place] "no rice"    History of Present Illness  12/17/2020  Pulmonary/ 1st office eval/ Anna Robbins / [facility]  Chief Complaint  Patient presents with   Consult    Consult for persistent dry cough after stroke.   Dyspnea: 150 steps and stops / cva prevents more walking  Cough: minm productive assoc with sensation of  pnds but doesn't keep her up  Sleep: in recliner 45 degrees x years SABA use: none 02 none  When wakes up has sensation of choking/ sleeps with cat Chokes p eating since cva  Main symptom from cva = balance  Eating makes nose run/ sneezing and afraid to use benadry "caused a hear attack"    No obvious other  patterns in day to day or daytime pnds/ cough  or assoc excess/ purulent sputum or mucus plugs or hemoptysis or cp or chest tightness, subjective wheeze or overt sinus or hb symptoms.   Also denies any obvious fluctuation of symptoms with weather or environmental changes or other aggravating or alleviating factors except as outlined above   No unusual exposure hx or h/o childhood pna/ asthma or knowledge of premature birth.  Current Allergies, Complete Past Medical History, Past Surgical History, Family History, and Social History were reviewed in [employer] record.  ROS  The  following are not active complaints unless bolded Hoarseness, sore throat, dysphagia, dental problems, itching, sneezing,  nasal congestion or discharge of excess mucus or purulent secretions, ear ache,   fever, chills, sweats, unintended wt loss or wt gain, classically pleuritic or exertional cp,  orthopnea pnd or arm/hand swelling  or leg swelling, presyncope, palpitations, abdominal pain, anorexia, nausea, vomiting, diarrhea  or change in bowel habits or change in bladder habits, change in stools or change in urine, dysuria, hematuria,  rash, arthralgias, visual complaints, headache, numbness, weakness or ataxia or problems with walking or coordination,  change in mood or  memory.             Past Medical History:  Diagnosis Date   Asthma    Diabetes mellitus without complication (HCC)    Kidney stones 07/2017   required sx   Pulmonary emboli (HCC) 06/2017   Seizures (HCC)    Stroke [facility])     Outpatient Medications Prior to Visit  Medication Sig Dispense Refill   acetaminophen (TYLENOL) 500 MG tablet Take 500-1,000 mg by mouth every 6 (six) hours as needed.     albuterol (VENTOLIN HFA) 108 (90 Base) MCG/ACT inhaler Inhale into the lungs every 6 (six) hours as needed for wheezing or shortness of breath.     amLODipine (NORVASC) 5 MG tablet Take 1 tablet (5 mg total) by mouth daily. 90 tablet 1   aspirin  EC 81 MG tablet Take 1 tablet (81 mg total) by mouth daily. Swallow whole. 30 tablet 11   DULoxetine (CYMBALTA) 20 MG capsule Take 1 capsule (20 mg total) by mouth daily. 90 capsule 1   gabapentin (NEURONTIN) 300 MG capsule Take 2 capsules (600 mg total) by mouth at bedtime. 180 capsule 1   glipiZIDE (GLUCOTROL) 5 MG tablet TAKE 1 TABLET(5 MG) BY MOUTH TWICE DAILY 60 tablet 2   hydrocortisone (ANUSOL-HC) 25 MG suppository Place 1 suppository (25 mg total) rectally 2 (two) times daily. 12 suppository 0   Ketoconazole-Hydrocortisone 2-2.5 % CREA Apply 1 application topically daily. 30 g 0    levETIRAcetam (KEPPRA) 750 MG tablet Take 1 tablet (750 mg total) by mouth 2 (two) times daily. 180 tablet 1   loperamide (IMODIUM) 2 MG capsule Take 2 mg by mouth daily.     metFORMIN (GLUCOPHAGE) 1000 MG tablet Take 1 tablet (1,000 mg total) by mouth 2 (two) times daily with a meal. 180 tablet 1   nystatin (MYCOSTATIN/NYSTOP) powder Apply 1 application topically 3 (three) times daily. 45 g 1   oxycodone (OXY-IR) 5 MG capsule Take 5 mg by mouth every 4 (four) hours as needed.     simvastatin (ZOCOR) 5 MG tablet Take 1 tablet (5 mg total) by mouth daily. 90 tablet 1   Vibegron (GEMTESA) 75 MG TABS Take 1 tablet by mouth daily. 30 tablet 2   No facility-administered medications prior to visit.     Objective:     BP 138/78 (BP Location: Left Arm, Patient Position: Sitting)   Pulse 97   Temp 98.8 F (37.1 C) (Temporal)   Ht 5\' 5"  (1.651 m)   Wt 205 lb 0.6 oz (93 kg)   SpO2 97% Comment: ra  BMI 34.12 kg/m   SpO2: 97 % (ra)  Pleasantly verbose amb (with rollator) elderly wf needed help standing and could not get up step to exam table   HEENT : pt wearing mask not removed for exam due to covid -19 concerns.    NECK :  without JVD/Nodes/TM/ nl carotid upstrokes bilaterally   LUNGS: no acc muscle use,  Nl contour chest which is clear to A and P bilaterally without cough on insp or exp maneuvers   CV:  RRR  no s3 or murmur or increase in P2, and trace bilateral LE edema   ABD:  soft and nontender with nl inspiratory excursion in the supine position. No bruits or organomegaly appreciated, bowel sounds nl  [id number] slow awkward gain on rollator / ext warm without deformities, calf tenderness, cyanosis or clubbing No obvious joint restrictions   SKIN: warm and dry without lesions    NEURO:  alert, approp, nl sensorium with  no motor or cerebellar deficits apparent.    Labs ordered 12/17/2020  :  allergy profile      I personally reviewed images and agree with radiology  impression as follows:  CXR:   10/25/20  pa and lateral .No acute cardiopulmonary disease.    Assessment   Cough variant asthma Onset probably in childhood assoc with symptoms of pnds and cat exp in bedroom  - Allergy profile 12/17/2020 >  Eos 0. /  IgE    ddx is between cough variant asthma and Upper airway cough syndrome (previously labeled PNDS),  is so named because it's frequently impossible to sort out how much is  CR/sinusitis with freq throat clearing (which can be related to primary GERD)   vs  causing  secondary (" extra esophageal")  GERD from wide swings in gastric pressure that occur with throat clearing, often  promoting self use of mint and menthol lozenges that reduce the lower esophageal sphincter tone and exacerbate the problem further in a cyclical fashion.   These are the same pts (now being labeled as having "irritable larynx syndrome" by some cough centers) who not infrequently have a history of having failed to tolerate ace inhibitors,  dry powder inhalers or biphosphonates or report having atypical/extraesophageal reflux symptoms that don't respond to standard doses of PPI  and are easily confused as having aecopd or asthma flares by even experienced allergists/ pulmonologists (myself included).   >>> rec start with allergy screen/ pet avoidance esp in bedroom  and trial of non -sedating antihistamines as apparently did not tolerate benadryl and max rx for gerd and f/u with MBS is not improving as cough worse since cva  DOE (dyspnea on exertion) Onset ? In childhood, better on "inhalers" but on 1st pulmonary eval 12/17/2020 more limited by residual from R frontal cva -  12/17/2020   Walked on RA x  3  lap(s) =  approx 434ft @ slow /walker pace, stopped due to end of study s sob  with lowest 02 sats 99%    No evidence of a cardiopulmonary mechanism limiting activity tol at this point and main issue appears to be deconditioning > already getting home PT  F/u can be  prn  Each maintenance medication was reviewed in detail including emphasizing most importantly the difference between maintenance and prns and under what circumstances the prns are to be triggered using an action plan format where appropriate.  Total time for H and P, chart review, counseling,  directly observing portions of ambulatory 02 saturation study/ and generating customized AVS unique to this new pt office visit / same day charting =60 min                    Christinia Gully, MD 12/17/2020

## 2020-12-17 NOTE — Assessment & Plan Note (Addendum)
Onset probably in childhood assoc with symptoms of pnds and cat exp in bedroom  - Allergy profile 12/17/2020 >  Eos 0. /  IgE    ddx is between cough variant asthma and Upper airway cough syndrome (previously labeled PNDS),  is so named because it's frequently impossible to sort out how much is  CR/sinusitis with freq throat clearing (which can be related to primary GERD)   vs  causing  secondary (" extra esophageal")  GERD from wide swings in gastric pressure that occur with throat clearing, often  promoting self use of mint and menthol lozenges that reduce the lower esophageal sphincter tone and exacerbate the problem further in a cyclical fashion.   These are the same pts (now being labeled as having "irritable larynx syndrome" by some cough centers) who not infrequently have a history of having failed to tolerate ace inhibitors,  dry powder inhalers or biphosphonates or report having atypical/extraesophageal reflux symptoms that don't respond to standard doses of PPI  and are easily confused as having aecopd or asthma flares by even experienced allergists/ pulmonologists (myself included).   >>> rec start with allergy screen/ pet avoidance esp in bedroom  and trial of non -sedating antihistamines as apparently did not tolerate benadryl and max rx for gerd and f/u with MBS is not improving as cough worse since cva

## 2020-12-17 NOTE — Patient Instructions (Addendum)
Ok to try clariton or allegra over the counter as needed for drippy nose.  Get rid of all pets in the bedroom.  Pantoprazole (protonix) 40 mg (prilosec 20mg )  Take  30-60 min before first meal of the day and Pepcid (famotidine)  20 mg after supper until return to office - this is the best way to tell whether stomach acid is contributing to your problem.    GERD (REFLUX)  is an extremely common cause of respiratory symptoms just like yours , many times with no obvious heartburn at all.    It can be treated with medication avoidance of late meals, excessive alcohol, and avoid fatty foods, chocolate, peppermint, colas, red wine, and acidic juices such as orange juice.  NO MINT OR MENTHOL PRODUCTS SO NO COUGH DROPS  USE SUGARLESS CANDY INSTEAD (Jolley ranchers or Stover's or Life Savers) or even ice chips will also do - the key is to swallow to prevent all throat clearing. NO OIL BASED VITAMINS - use powdered substitutes.  Avoid fish oil when coughing.  Please remember to go to the lab department   for your tests - we will call you with the results when they are available.  If continue to cough / choke after eating then you need a swallowing evaluation called a modified barium swallow.

## 2020-12-21 LAB — BRAIN NATRIURETIC PEPTIDE: BNP: 61.5 pg/mL (ref 0.0–100.0)

## 2020-12-21 LAB — CBC WITH DIFFERENTIAL/PLATELET
Basophils Absolute: 0.1 10*3/uL (ref 0.0–0.2)
Basos: 1 %
EOS (ABSOLUTE): 0.4 10*3/uL (ref 0.0–0.4)
Eos: 7 %
Hematocrit: 34.4 % (ref 34.0–46.6)
Hemoglobin: 11.4 g/dL (ref 11.1–15.9)
Immature Grans (Abs): 0 10*3/uL (ref 0.0–0.1)
Immature Granulocytes: 0 %
Lymphocytes Absolute: 1.9 10*3/uL (ref 0.7–3.1)
Lymphs: 30 %
MCH: 29.9 pg (ref 26.6–33.0)
MCHC: 33.1 g/dL (ref 31.5–35.7)
MCV: 90 fL (ref 79–97)
Monocytes Absolute: 0.3 10*3/uL (ref 0.1–0.9)
Monocytes: 5 %
Neutrophils Absolute: 3.6 10*3/uL (ref 1.4–7.0)
Neutrophils: 57 %
Platelets: 237 10*3/uL (ref 150–450)
RBC: 3.81 x10E6/uL (ref 3.77–5.28)
RDW: 12.3 % (ref 11.7–15.4)
WBC: 6.3 10*3/uL (ref 3.4–10.8)

## 2020-12-21 LAB — IGE: IgE (Immunoglobulin E), Serum: 46 IU/mL (ref 6–495)

## 2020-12-21 LAB — SEDIMENTATION RATE: Sed Rate: 14 mm/hr (ref 0–40)

## 2020-12-21 LAB — D-DIMER, QUANTITATIVE: D-DIMER: 1.07 mg/L FEU — ABNORMAL HIGH (ref 0.00–0.49)

## 2020-12-21 LAB — TSH: TSH: 1.92 u[IU]/mL (ref 0.450–4.500)

## 2020-12-23 ENCOUNTER — Other Ambulatory Visit: Payer: Self-pay | Admitting: Internal Medicine

## 2020-12-23 DIAGNOSIS — E1165 Type 2 diabetes mellitus with hyperglycemia: Secondary | ICD-10-CM

## 2020-12-24 ENCOUNTER — Other Ambulatory Visit: Payer: Self-pay | Admitting: *Deleted

## 2020-12-24 ENCOUNTER — Other Ambulatory Visit: Payer: Self-pay

## 2020-12-24 ENCOUNTER — Ambulatory Visit: Payer: Medicare Other | Admitting: Cardiology

## 2020-12-24 ENCOUNTER — Encounter: Payer: Self-pay | Admitting: Cardiology

## 2020-12-24 VITALS — BP 158/94 | HR 95 | Ht 65.0 in | Wt 206.2 lb

## 2020-12-24 DIAGNOSIS — E785 Hyperlipidemia, unspecified: Secondary | ICD-10-CM

## 2020-12-24 DIAGNOSIS — I639 Cerebral infarction, unspecified: Secondary | ICD-10-CM

## 2020-12-24 DIAGNOSIS — E1165 Type 2 diabetes mellitus with hyperglycemia: Secondary | ICD-10-CM

## 2020-12-24 DIAGNOSIS — R079 Chest pain, unspecified: Secondary | ICD-10-CM | POA: Diagnosis not present

## 2020-12-24 DIAGNOSIS — F320 Major depressive disorder, single episode, mild: Secondary | ICD-10-CM

## 2020-12-24 DIAGNOSIS — I10 Essential (primary) hypertension: Secondary | ICD-10-CM

## 2020-12-24 MED ORDER — SIMVASTATIN 5 MG PO TABS: 5.0000 mg | ORAL_TABLET | Freq: Every day | ORAL | 1 refills | Status: DC

## 2020-12-24 MED ORDER — DULOXETINE HCL 20 MG PO CPEP: 20.0000 mg | ORAL_CAPSULE | Freq: Every day | ORAL | 1 refills | Status: DC

## 2020-12-24 MED ORDER — METFORMIN HCL 1000 MG PO TABS: 1000.0000 mg | ORAL_TABLET | Freq: Two times a day (BID) | ORAL | 1 refills | Status: DC

## 2020-12-24 MED ORDER — AMLODIPINE BESYLATE 5 MG PO TABS: 5.0000 mg | ORAL_TABLET | Freq: Every day | ORAL | 1 refills | Status: DC

## 2020-12-24 MED ORDER — AMLODIPINE BESYLATE 5 MG PO TABS: 7.5000 mg | ORAL_TABLET | Freq: Every day | ORAL | 3 refills | Status: DC

## 2020-12-24 NOTE — Patient Instructions (Signed)
Medication Instructions:  Your physician has recommended you make the following change in your medication:  INCREASE Amlodipine to 7.5 mg (1.5 tablets) daily   *If you need a refill on your cardiac medications before your next appointment, please call your pharmacy*   Lab Work: None If you have labs (blood work) drawn today and your tests are completely normal, you will receive your results only by: MyChart Message (if you have MyChart) OR A paper copy in the mail If you have any lab test that is abnormal or we need to change your treatment, we will call you to review the results.   Testing/Procedures: Your physician has requested that you have a lexiscan myoview. For further information please visit [email or web address] Please follow instruction sheet, as given.    Follow-Up: At [facility], you and your health needs are our priority.  As part of our continuing mission to provide you with exceptional heart care, we have created designated Provider Care Teams.  These Care Teams include your primary Cardiologist (physician) and Advanced Practice Providers (APPs -  Physician Assistants and Nurse Practitioners) who all work together to provide you with the care you need, when you need it.  We recommend signing up for the patient portal called "MyChart".  Sign up information is provided on this After Visit Summary.  MyChart is used to connect with patients for Virtual Visits (Telemedicine).  Patients are able to view lab/test results, encounter notes, upcoming appointments, etc.  Non-urgent messages can be sent to your provider as well.   To learn more about what you can do with MyChart, go to [email or web address].    Your next appointment:   6 month(s)  The format for your next appointment:   In Person  Provider:   Laurance Flatten, MD     Other Instructions

## 2020-12-24 NOTE — Progress Notes (Signed)
Cardiology Office Note:    Date:  12/24/2020   ID:  Anna Robbins, DOB 11/26/1932, MRN [id number]  PCP:   Roberts, NP   [facility] Providers Cardiologist:  None {   Referring MD:  Roberts, NP     History of Present Illness:    Anna Robbins is a 87 y.o. female with a hx of asthma, DMII, history of PE (2019), seizures, and CVA in 2020 who was referred by Bjorn Pippin for furher evaluation of chest pain.  Today, the patient states that over the past month, she has been experiencing pain in her left breast that radiates across the left chest. Pain is non-exertional in nature. It is sharp and occurs about 3-4 times per week lasting anywhere from 5-30min. No associated shortness of breath, palpitations, or syncope. Has chronic dizziness and fatigue at baseline but not worsened with the pain. No LE edema. States she is unable to lay flat at night as she feels like she cannot breathe. This is a chronic issues and she has been sleeping in a recliner for 20 years. No known history of CAD or HF.   Family History: Mother with MI x2 (65), Dad with CVA, MI (79).   Past Medical History:  Diagnosis Date   Asthma    Diabetes mellitus without complication (HCC)    Kidney stones 07/2017   required sx   Pulmonary emboli (HCC) 06/2017   Seizures (HCC)    [facility]roke [facility])     Past Surgical History:  Procedure Laterality Date   ABDOMINAL HYSTERECTOMY     APPENDECTOMY     thumb surgery     TONSILLECTOMY      Current Medications: Current Meds  Medication Sig   acetaminophen (TYLENOL) 500 MG tablet Take 500-1,000 mg by mouth every 6 (six) hours as needed.   albuterol (VENTOLIN HFA) 108 (90 Base) MCG/ACT inhaler Inhale into the lungs every 6 (six) hours as needed for wheezing or shortness of breath.   aspirin EC 81 MG tablet Take 1 tablet (81 mg total) by mouth daily. Swallow whole.   DULoxetine (CYMBALTA) 20 MG capsule Take 1 capsule (20 mg total) by mouth daily.   famotidine  (PEPCID) 20 MG tablet One after supper   gabapentin (NEURONTIN) 300 MG capsule Take 2 capsules (600 mg total) by mouth at bedtime.   glipiZIDE (GLUCOTROL) 5 MG tablet TAKE 1 TABLET(5 MG) BY MOUTH TWICE DAILY   hydrocortisone (ANUSOL-HC) 25 MG suppository Place 1 suppository (25 mg total) rectally 2 (two) times daily.   Ketoconazole-Hydrocortisone 2-2.5 % CREA Apply 1 application topically daily.   levETIRAcetam (KEPPRA) 750 MG tablet Take 1 tablet (750 mg total) by mouth 2 (two) times daily.   loperamide (IMODIUM) 2 MG capsule Take 2 mg by mouth daily.   metFORMIN (GLUCOPHAGE) 1000 MG tablet Take 1 tablet (1,000 mg total) by mouth 2 (two) times daily with a meal.   nystatin (MYCOSTATIN/NYSTOP) powder Apply 1 application topically 3 (three) times daily.   oxycodone (OXY-IR) 5 MG capsule Take 5 mg by mouth every 4 (four) hours as needed.   pantoprazole (PROTONIX) 40 MG tablet Take 1 tablet (40 mg total) by mouth daily. Take 30-60 min before first meal of the day   simvastatin (ZOCOR) 5 MG tablet Take 1 tablet (5 mg total) by mouth daily.   Vibegron (GEMTESA) 75 MG TABS Take 1 tablet by mouth daily.   [DISCONTINUED] amLODipine (NORVASC) 5 MG tablet Take 1 tablet (5 mg total) by  mouth daily.     Allergies:   Acetaminophen, Asa [aspirin], Diphenhydramine hcl, Lisinopril, Melatonin, Molds & smuts, Nsaids, Other, Oxycodone hcl, Penicillin g, Penicillins, and Tramadol   Social History   Socioeconomic History   Marital status: Widowed    Spouse name: Not on file   Number of children: Not on file   Years of education: Not on file   Highest education level: Not on file  Occupational History   Not on file  Tobacco Use   Smoking status: Never   Smokeless tobacco: Never  Vaping Use   Vaping Use: Never used  Substance and Sexual Activity   Alcohol use: Not Currently   Drug use: Not Currently   Sexual activity: Not Currently  Other Topics Concern   Not on file  Social History Narrative    Lives with daughter and son in law and their 2 kids, also son in law's mother.   Left Handed   Driinks 1-2 cups caffeine weekly   Social Determinants of Health   Financial Resource Strain: Low Risk    Difficulty of Paying Living Expenses: Not hard at all  Food Insecurity: No Food Insecurity   Worried About Programme researcher, broadcasting/film/videounning Out of Food in the Last Year: Never true   Baristaan Out of Food in the Last Year: Never true  Transportation Needs: No Transportation Needs   Lack of Transportation (Medical): No   Lack of Transportation (Non-Medical): No  Physical Activity: Inactive   Days of Exercise per Week: 0 days   Minutes of Exercise per Session: 0 min  Stress: No Stress Concern Present   Feeling of Stress : Only a little  Social Connections: Moderately Isolated   Frequency of Communication with Friends and Family: More than three times a week   Frequency of Social Gatherings with Friends and Family: More than three times a week   Attends Religious Services: More than 4 times per year   Active Member of [employer]ubs or Organizations: No   Attends BankerClub or Organization Meetings: Never   Marital Status: Widowed     Family History: The patient's family history is not on file.  ROS:   Please see the history of present illness.    Review of Systems  Constitutional:  Positive for malaise/fatigue.  HENT:  Positive for congestion.   Respiratory:  Positive for wheezing.   Cardiovascular:  Positive for chest pain. Negative for palpitations, orthopnea, claudication, leg swelling and PND.  Gastrointestinal:  Negative for melena.  Genitourinary:  Negative for hematuria.  Musculoskeletal:  Positive for joint pain.  Neurological:  Positive for dizziness. Negative for loss of consciousness.    EKGs/Labs/Other Studies Reviewed:    The following studies were reviewed today: No recent cardiac testing  EKG:  EKG is  ordered today.  The ekg ordered today demonstrates NSR with RBBB  Recent Labs: 11/06/2020: ALT 8;  BUN 20; Creatinine, Ser 0.88; Hemoglobin 11.6; Platelets 228; Potassium 4.6; Sodium 138  Recent Lipid Panel    Component Value Date/Time   CHOL 128 11/06/2020 1142   TRIG 103 11/06/2020 1142   HDL 55 11/06/2020 1142   CHOLHDL 3.0 02/20/2020 1105   LDLCALC 54 11/06/2020 1142            Physical Exam:    VS:  BP (!) 158/94   Pulse 95   Ht 5\' 5"  (1.651 m)   Wt 206 lb 3.2 oz (93.5 kg)   SpO2 96%   BMI 34.31 kg/m     Wt  Readings from Last 3 Encounters:  12/24/20 206 lb 3.2 oz (93.5 kg)  12/17/20 205 lb 0.6 oz (93 kg)  10/25/20 203 lb (92.1 kg)     GEN:  Well nourished, well developed in no acute distress HEENT: Normal NECK: No JVD; No carotid bruits CARDIAC: RRR, no murmurs, rubs, gallops RESPIRATORY:  Clear to auscultation without rales, wheezing or rhonchi  ABDOMEN: Soft, non-tender, non-distended MUSCULOSKELETAL:  Trace edema; No deformity  SKIN: Warm and dry NEUROLOGIC:  Alert and oriented x 3 PSYCHIATRIC:  Normal affect   ASSESSMENT:    1. Chest pain, unspecified type   2. Essential hypertension    PLAN:    In order of problems listed above:  #Chest Pain: Patient with intermittent episodes of sharp left sided chest pain radiating across her chest over the past month. Symptoms are not exertional but have been increasing in frequency. No associated palpitations, SOB or lightheadedness. Has chronic dizziness and fatigue that is stable. While pain is atypical, the patient has several risk factors for CAD including family history, history of stroke, HTN and advanced age. Will check myoview to assess for ischemia.  -Check myoview -Continue ASA 81mg  daily -Continue simvastatin 5mg  daily  #History of CVA: Occurred in 2020 while in [place]. No significant residual deficits. Per report, TTE normal and no known arrhythmias on monitor. -Continue ASA 81mg  daily -Continue simvastatin 5mg  daily -Follow-up with neuro as scheduled   #HLD: LDL well controlled at  54. -Continue simva 5mg  daily -Management per PCP   #HTN: Elevated today and has been running high at home. -Increase amlodipine to 7.5mg  daily   #DMII: A1C 6.6. -Managed by PCP  #Seizure Disorder: -Management per Neuro      Shared Decision Making/Informed Consent The risks [chest pain, shortness of breath, cardiac arrhythmias, dizziness, blood pressure fluctuations, myocardial infarction, stroke/transient ischemic attack, nausea, vomiting, allergic reaction, radiation exposure, metallic taste sensation and life-threatening complications (estimated to be 1 in 10,000)], benefits (risk stratification, diagnosing coronary artery disease, treatment guidance) and alternatives of a nuclear stress test were discussed in detail with Ms. Quentin Cornwall and she agrees to proceed.    Medication Adjustments/Labs and Tests Ordered: Current medicines are reviewed at length with the patient today.  Concerns regarding medicines are outlined above.  Orders Placed This Encounter  Procedures   NM Myocar Multi W/Spect W/Wall Motion / EF   EKG 12-Lead   Meds ordered this encounter  Medications   amLODipine (NORVASC) 5 MG tablet    Sig: Take 1.5 tablets (7.5 mg total) by mouth daily.    Dispense:  135 tablet    Refill:  3    Patient Instructions  Medication Instructions:  Your physician has recommended you make the following change in your medication:  INCREASE Amlodipine to 7.5 mg (1.5 tablets) daily   *If you need a refill on your cardiac medications before your next appointment, please call your pharmacy*   Lab Work: None If you have labs (blood work) drawn today and your tests are completely normal, you will receive your results only by: [place] (if you have MyChart) OR A paper copy in the mail If you have any lab test that is abnormal or we need to change your treatment, we will call you to review the results.   Testing/Procedures: Your physician has requested that you have a  lexiscan myoview. For further information please visit [email or web address]. Please follow instruction sheet, as given.    Follow-Up: At [facility], you and your health needs  are our priority.  As part of our continuing mission to provide you with exceptional heart care, we have created designated Provider Care Teams.  These Care Teams include your primary Cardiologist (physician) and Advanced Practice Providers (APPs -  Physician Assistants and Nurse Practitioners) who all work together to provide you with the care you need, when you need it.  We recommend signing up for the patient portal called "MyChart".  Sign up information is provided on this After Visit Summary.  MyChart is used to connect with patients for Virtual Visits (Telemedicine).  Patients are able to view lab/test results, encounter notes, upcoming appointments, etc.  Non-urgent messages can be sent to your provider as well.   To learn more about what you can do with MyChart, go to [email or web address].    Your next appointment:   6 month(s)  The format for your next appointment:   In Person  Provider:   Gwyndolyn Kaufman, MD     Other Instructions     Signed, Freada Bergeron, MD  12/24/2020 3:28 PM    [place]

## 2020-12-25 ENCOUNTER — Encounter (HOSPITAL_COMMUNITY): Payer: Self-pay

## 2020-12-25 ENCOUNTER — Ambulatory Visit (HOSPITAL_COMMUNITY)
Admission: RE | Admit: 2020-12-25 | Discharge: 2020-12-25 | Disposition: A | Discharge status: Home / Self Care | Payer: Medicare Other | Source: Ambulatory Visit | Attending: Cardiology | Admitting: Cardiology

## 2020-12-25 DIAGNOSIS — R079 Chest pain, unspecified: Secondary | ICD-10-CM | POA: Diagnosis not present

## 2020-12-25 LAB — NM MYOCAR MULTI W/SPECT W/WALL MOTION / EF
LV dias vol: 102 mL (ref 46–106)
LV sys vol: 37 mL
Nuc Stress EF: 64 %
Peak HR: 83 {beats}/min
RATE: 0.3
Rest HR: 52 {beats}/min
Rest Nuclear Isotope Dose: 8.3 mCi
SDS: 1
SRS: 5
SSS: 6
ST Depression (mm): 0 mm
Stress Nuclear Isotope Dose: 25 mCi
TID: 1.23

## 2020-12-25 MED ORDER — REGADENOSON 0.4 MG/5ML IV SOLN
INTRAVENOUS | Status: AC
  Administered 2020-12-25: 0.4 mg via INTRAVENOUS
  Filled 2020-12-25: qty 5

## 2020-12-25 MED ORDER — TECHNETIUM TC 99M TETROFOSMIN IV KIT
7.0000 | PACK | Freq: Once | INTRAVENOUS | Status: AC | PRN
  Administered 2020-12-25: 8.3 via INTRAVENOUS

## 2020-12-25 MED ORDER — TECHNETIUM TC 99M TETROFOSMIN IV KIT
21.0000 | PACK | Freq: Once | INTRAVENOUS | Status: AC | PRN
  Administered 2020-12-25: 25 via INTRAVENOUS

## 2020-12-25 MED ORDER — SODIUM CHLORIDE FLUSH 0.9 % IV SOLN
INTRAVENOUS | Status: AC
  Administered 2020-12-25: 10 mL via INTRAVENOUS
  Filled 2020-12-25: qty 10

## 2021-01-01 NOTE — Progress Notes (Signed)
Her A1c is < 7, so she is at goal.

## 2021-01-01 NOTE — Progress Notes (Signed)
Stress test is great.

## 2021-02-22 IMAGING — MR MR HEAD WO/W CM
13 of 14 series · 38 of 48 positions shown · IV contrast (gadavist)
Comparison: None.

CLINICAL DATA: Headaches and ataxia

EXAM:
MRI HEAD WITHOUT AND WITH CONTRAST
TECHNIQUE: Multiplanar, multiecho pulse sequences of the brain and surrounding
structures were obtained without and with intravenous contrast.
CONTRAST:  10mL GADAVIST GADOBUTROL 1 MMOL/ML IV SOLN

[Series 5: DWI · axial · 3.0mm · 0.77mm/px · z∈[-33,+113]mm · 3 of 50 slices shown (1 of 4)]
[im 1/50]
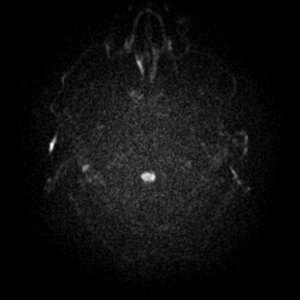
[im 25/50]
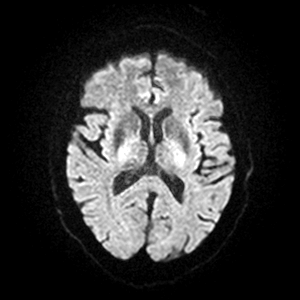
[im 50/50]
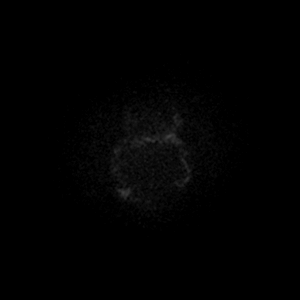

[Series 6: DWI · axial · 3.0mm · 0.77mm/px · z∈[-33,+113]mm · 3 of 50 slices shown (2 of 4)]
[im 1/50]
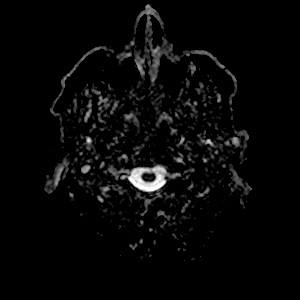
[im 25/50]
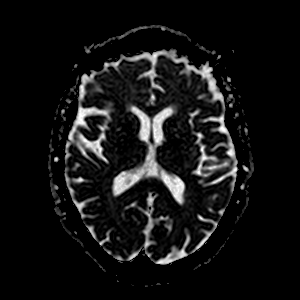
[im 50/50]
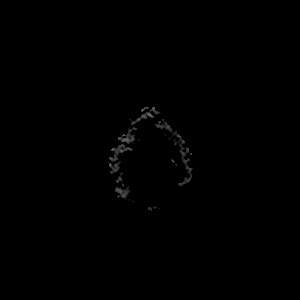

[Series 7: DWI · coronal · 5.0mm · 0.88mm/px · 2 of 28 slices shown (3 of 4)]
[im 1/28]
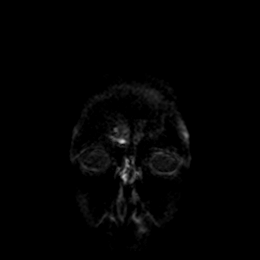
[im 28/28]
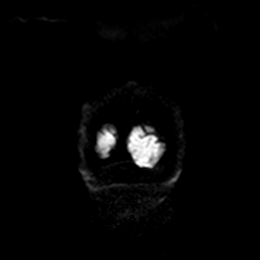

[Series 8: DWI · coronal · 5.0mm · 0.88mm/px · 2 of 28 slices shown (4 of 4)]
[im 1/28]
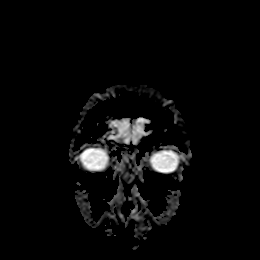
[im 28/28]
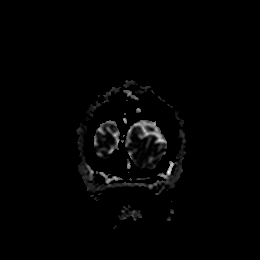

[Series 9: T1 · sagittal · 5.0mm · 0.75mm/px · 1 of 21 slices shown (1 of 2)]
[im 1/21]
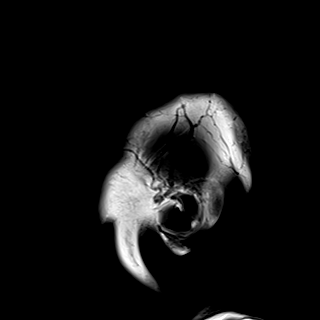

[Series 10: T2 · axial · 5.0mm · 0.72mm/px · 1 of 23 slices shown (1 of 2)]
[im 1/23]
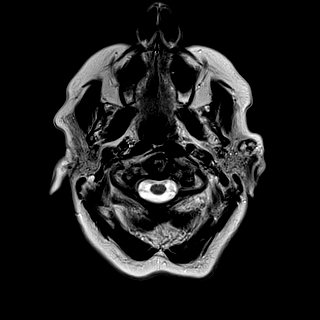

[Series 11: mag_images · axial · 3.0mm · 0.90mm/px · z∈[-48,+128]mm · 3 of 60 slices shown]
[im 1/60]
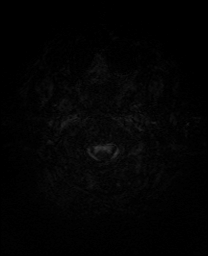
[im 30/60]
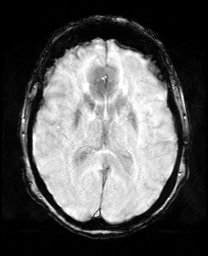
[im 60/60]
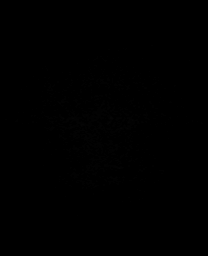

[Series 12: pha_images · axial · 3.0mm · 0.90mm/px · z∈[-48,+125]mm · 3 of 58 slices shown]
[im 1/58]
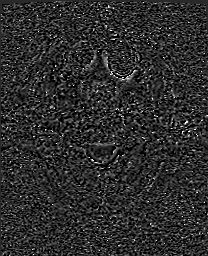
[im 29/58]
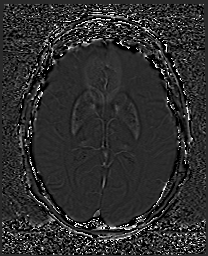
[im 58/58]
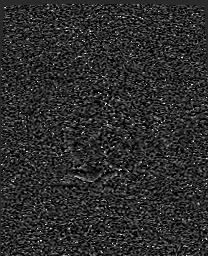

[Series 13: swi_images · axial · 3.0mm · 0.90mm/px · z∈[-48,+128]mm · 3 of 60 slices shown]
[im 1/60]
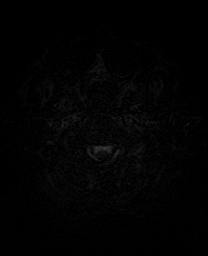
[im 30/60]
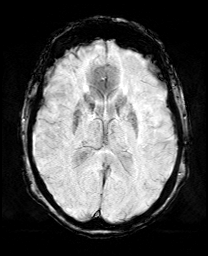
[im 60/60]
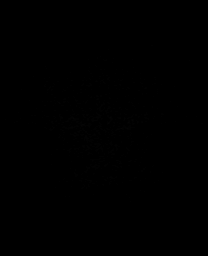

[Series 15: FLAIR · axial · 3.0mm · 0.45mm/px · z∈[-33,+113]mm · 3 of 50 slices shown]
[im 1/50]
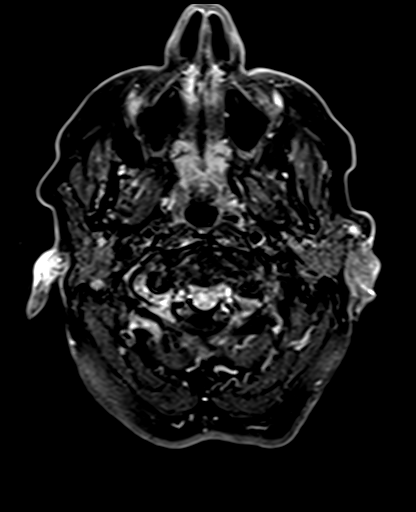
[im 25/50]
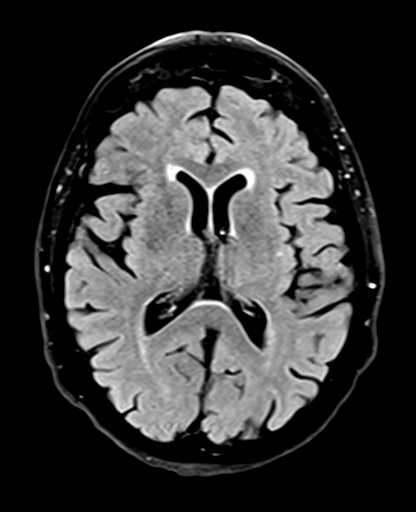
[im 50/50]
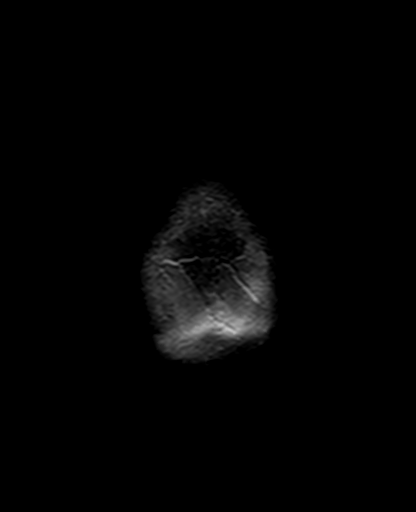

[Series 16: T1 · axial · 1.0mm · 0.98mm/px · z∈[-48,+126]mm · 10 of 172 slices shown (2 of 2)]
[im 1/172]
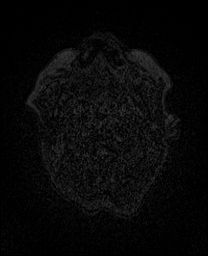
[im 20/172]
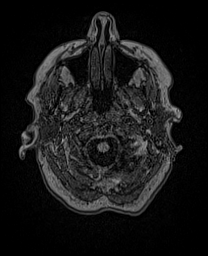
[im 39/172]
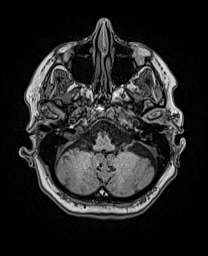
[im 58/172]
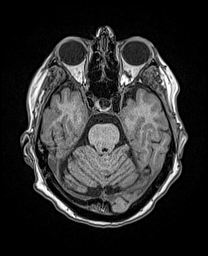
[im 77/172]
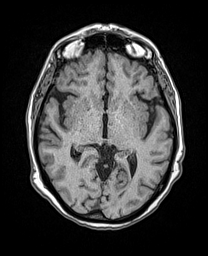
[im 96/172]
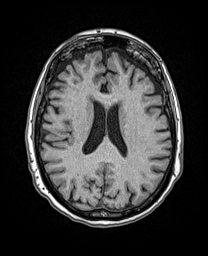
[im 115/172]
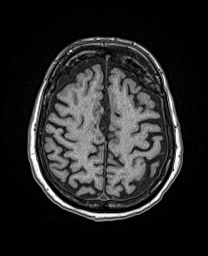
[im 134/172]
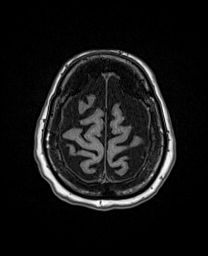
[im 153/172]
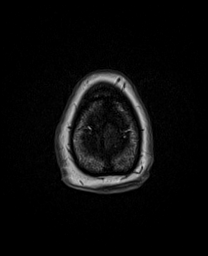
[im 172/172]
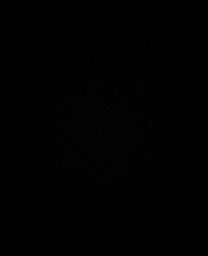

[Series 17: T2 · coronal · 5.0mm · 0.72mm/px · 2 of 28 slices shown (2 of 2)]
[im 1/28]
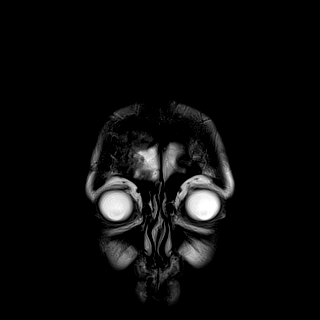
[im 28/28]
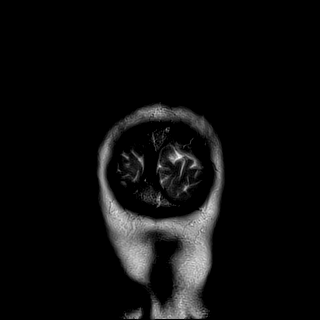

[Series 19: T1 post-contrast · coronal · 5.0mm · 0.34mm/px · 2 of 28 slices shown]
[im 1/28]
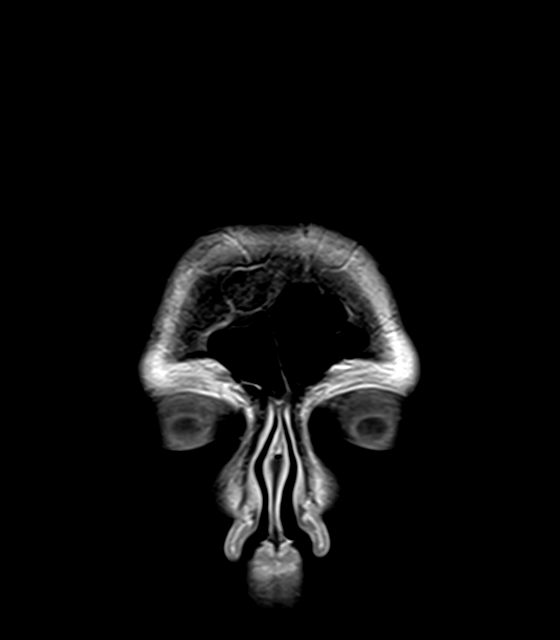
[im 28/28]
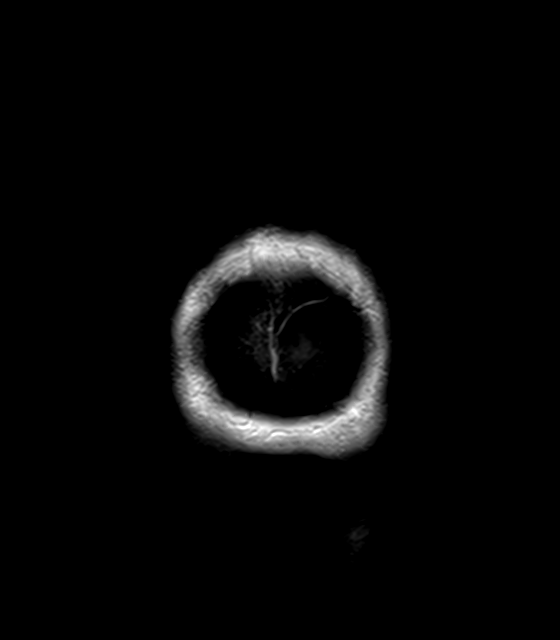

[38 of 48 positions shown; findings below may reference images not displayed]

FINDINGS: Brain: No acute infarct, mass effect or extra-axial collection.
Chronic blood products adjacent to the falx cerebri at the vertex.
No acute hemorrhage. Old small paramedian right frontal lobe
infarct. Normal CSF spaces. The midline structures are normal.

Vascular: Major flow voids are preserved.

Skull and upper cervical spine: Normal calvarium and skull base.
Visualized upper cervical spine and soft tissues are normal.

Sinuses/Orbits:No paranasal sinus fluid levels or advanced mucosal
thickening. No mastoid or middle ear effusion. Normal orbits.
IMPRESSION: 1. No acute intracranial abnormality.
2. Old small paramedian right frontal lobe infarct.
3. Old blood products adjacent to the superior falx cerebri.

## 2021-03-04 ENCOUNTER — Encounter: Payer: Self-pay | Admitting: Nurse Practitioner

## 2021-03-04 ENCOUNTER — Ambulatory Visit (INDEPENDENT_AMBULATORY_CARE_PROVIDER_SITE_OTHER): Payer: Medicare Other | Admitting: Nurse Practitioner

## 2021-03-04 ENCOUNTER — Other Ambulatory Visit: Payer: Self-pay

## 2021-03-04 VITALS — BP 130/68 | HR 91 | Ht 65.0 in | Wt 205.0 lb

## 2021-03-04 DIAGNOSIS — I10 Essential (primary) hypertension: Secondary | ICD-10-CM

## 2021-03-04 DIAGNOSIS — E1165 Type 2 diabetes mellitus with hyperglycemia: Secondary | ICD-10-CM | POA: Diagnosis not present

## 2021-03-04 DIAGNOSIS — G40909 Epilepsy, unspecified, not intractable, without status epilepticus: Secondary | ICD-10-CM | POA: Diagnosis not present

## 2021-03-04 DIAGNOSIS — R300 Dysuria: Secondary | ICD-10-CM

## 2021-03-04 DIAGNOSIS — E785 Hyperlipidemia, unspecified: Secondary | ICD-10-CM

## 2021-03-04 MED ORDER — LEVETIRACETAM 750 MG PO TABS: 750.0000 mg | ORAL_TABLET | Freq: Two times a day (BID) | ORAL | 1 refills | Status: DC

## 2021-03-04 MED ORDER — AMLODIPINE BESYLATE 5 MG PO TABS: 7.5000 mg | ORAL_TABLET | Freq: Every day | ORAL | 3 refills | Status: DC

## 2021-03-04 NOTE — Progress Notes (Signed)
° °  Anna Robbins     MRN: [id number]      DOB: 04-13-1932   HPI [name] is here for follow up and re-evaluation of chronic medical conditions, medication management and review of any available recent lab and radiology data.  Preventive health is updated, specifically   Immunization.   Questions or concerns regarding consultations or procedures which the PT has had in the interim are  addressed. The PT denies any adverse reactions to current medications since the last visit.   Pt c/o burning sensation with urination that started since about 5 days ago. Denies pelvic pain, fever, chills , N, V. She is not able to provide urine sample today.   She has a follow up appointment with neurology on Feb., 28.   Fasting AM sugar 150's in the morning.    Pt stated that she has had all required vaccines at her previous PCP clinic, but she does not a record with her today.  ROS Denies recent fever or chills. Denies sinus pressure, nasal congestion, ear pain or sore throat. Denies chest congestion, productive cough or wheezing. Denies chest pains, palpitations and leg swelling Denies abdominal pain, nausea, vomiting,diarrhea or constipation.   Has dysuria,  denies frequency, hesitancy or incontinence. Denies joint pain, swelling and limitation in mobility. Denies headaches, seizures, numbness, or tingling. Denies depression, anxiety or insomnia. Marland Kitchen   PE  BP 130/68 (BP Location: Left Arm, Cuff Size: Normal)    Pulse 91    Ht 5\' 5"  (1.651 m)    Wt 205 lb (93 kg)    SpO2 98%    BMI 34.11 kg/m   Patient alert and oriented and in no cardiopulmonary distress.  Chest: Clear to auscultation bilaterally.  CVS: S1, S2 no murmurs, no S3.Regular rate.  ABD: Soft non tender.   Ext: No edema  MS. Has limited ROM spine, shoulders, hips and knees, uses a walker   Skin: Intact, no ulcerations or rash noted.  Psych: Good eye contact, normal affect. Memory intact not anxious or depressed  appearing.  CNS: CN 2-12 intact, power,  normal throughout.no focal deficits noted.   Assessment & Plan

## 2021-03-04 NOTE — Patient Instructions (Addendum)
Goal for fasting blood sugar ranges from 80 to 120 and 2 hours after any meal or at bedtime should be between 130 to 170.   Please get your labs done tomorrow.   It is important that you exercise regularly at least 30 minutes 5 times a week.  Think about what you will eat, plan ahead. Choose " clean, green, fresh or frozen" over canned, processed or packaged foods which are more sugary, salty and fatty. 70 to 75% of food eaten should be vegetables and fruit. Three meals at set times with snacks allowed between meals, but they must be fruit or vegetables. Aim to eat over a 12 hour period , example 7 am to 7 pm, and STOP after  your last meal of the day. Drink water,generally about 64 ounces per day, no other drink is as healthy. Fruit juice is best enjoyed in a healthy way, by EATING the fruit.  Thanks for choosing [facility], we consider it a privelige to serve you.

## 2021-03-04 NOTE — Assessment & Plan Note (Signed)
Unable to do a UA and culture today due to pt not been able to provide urine sample, she stated that she will bring some tomorrow for testing.

## 2021-03-04 NOTE — Assessment & Plan Note (Signed)
Lipid panel today, takes simvastatin 5 mg daily.  Lab Results  Component Value Date   CHOL 128 11/06/2020   HDL 55 11/06/2020   LDLCALC 54 11/06/2020   TRIG 103 11/06/2020   CHOLHDL 3.0 02/20/2020

## 2021-03-04 NOTE — Assessment & Plan Note (Signed)
Lab Results  Component Value Date   HGBA1C 6.6 (H) 11/06/2020   Check A1C today States that she had her diabetic eye exam done at her opthamologist office. Foot exam today.

## 2021-03-04 NOTE — Assessment & Plan Note (Signed)
Stable Chronic condition. keprra refilled today followed by neurology

## 2021-03-04 NOTE — Assessment & Plan Note (Signed)
DASH diet and commitment to daily physical activity for a minimum of 30 minutes discussed and encouraged, as a part of hypertension management. The importance of attaining a healthy weight is also discussed.  BP/Weight 03/04/2021 12/24/2020 12/17/2020 10/25/2020 09/16/2020 07/08/2020 0000000  Systolic BP [id number] 0000000 [id number]  Diastolic BP 68 94 78 73 77 89 72  Wt. (Lbs) 205 206.2 205.04 203 209.5 208 208.4  BMI 34.11 34.31 34.12 33.78 34.86 34.61 34.68  continue current meds.

## 2021-03-05 DIAGNOSIS — E785 Hyperlipidemia, unspecified: Secondary | ICD-10-CM | POA: Diagnosis not present

## 2021-03-05 DIAGNOSIS — I10 Essential (primary) hypertension: Secondary | ICD-10-CM | POA: Diagnosis not present

## 2021-03-05 DIAGNOSIS — R300 Dysuria: Secondary | ICD-10-CM | POA: Diagnosis not present

## 2021-03-05 DIAGNOSIS — E1165 Type 2 diabetes mellitus with hyperglycemia: Secondary | ICD-10-CM | POA: Diagnosis not present

## 2021-03-05 LAB — POCT URINALYSIS DIP (CLINITEK)
Bilirubin, UA: NEGATIVE
Blood, UA: NEGATIVE
Glucose, UA: NEGATIVE mg/dL
Ketones, POC UA: NEGATIVE mg/dL
Nitrite, UA: NEGATIVE
POC PROTEIN,UA: NEGATIVE
Spec Grav, UA: 1.025 (ref 1.010–1.025)
Urobilinogen, UA: 0.2 E.U./dL
pH, UA: 5 (ref 5.0–8.0)

## 2021-03-05 NOTE — Addendum Note (Signed)
Addended by: Abner Greenspan on: 03/05/2021 02:24 PM   Modules accepted: Orders

## 2021-03-07 ENCOUNTER — Other Ambulatory Visit: Payer: Self-pay | Admitting: Nurse Practitioner

## 2021-03-07 DIAGNOSIS — E785 Hyperlipidemia, unspecified: Secondary | ICD-10-CM

## 2021-03-07 DIAGNOSIS — E875 Hyperkalemia: Secondary | ICD-10-CM

## 2021-03-07 LAB — CMP14+EGFR
ALT: 8 IU/L (ref 0–32)
AST: 14 IU/L (ref 0–40)
Albumin/Globulin Ratio: 2.5 — ABNORMAL HIGH (ref 1.2–2.2)
Albumin: 5 g/dL — ABNORMAL HIGH (ref 3.6–4.6)
Alkaline Phosphatase: 57 IU/L (ref 44–121)
BUN/Creatinine Ratio: 26 (ref 12–28)
BUN: 23 mg/dL (ref 8–27)
Bilirubin Total: 0.3 mg/dL (ref 0.0–1.2)
CO2: 22 mmol/L (ref 20–29)
Calcium: 10.1 mg/dL (ref 8.7–10.3)
Chloride: 103 mmol/L (ref 96–106)
Creatinine, Ser: 0.9 mg/dL (ref 0.57–1.00)
Globulin, Total: 2 g/dL (ref 1.5–4.5)
Glucose: 149 mg/dL — ABNORMAL HIGH (ref 70–99)
Potassium: 5.4 mmol/L — ABNORMAL HIGH (ref 3.5–5.2)
Sodium: 141 mmol/L (ref 134–144)
Total Protein: 7 g/dL (ref 6.0–8.5)
eGFR: 62 mL/min/{1.73_m2} (ref >59)

## 2021-03-07 LAB — LIPID PANEL
Chol/HDL Ratio: 2.7 ratio (ref 0.0–4.4)
Cholesterol, Total: 142 mg/dL (ref 100–199)
HDL: 52 mg/dL (ref >39)
LDL Chol Calc (NIH): 70 mg/dL (ref 0–99)
Triglycerides: 112 mg/dL (ref 0–149)
VLDL Cholesterol Cal: 20 mg/dL (ref 5–40)

## 2021-03-07 LAB — SPECIMEN STATUS REPORT

## 2021-03-07 LAB — HEMOGLOBIN A1C
Est. average glucose Bld gHb Est-mCnc: 157 mg/dL
Hgb A1c MFr Bld: 7.1 % — ABNORMAL HIGH (ref 4.8–5.6)

## 2021-03-07 MED ORDER — SIMVASTATIN 10 MG PO TABS: 10.0000 mg | ORAL_TABLET | Freq: Every day | ORAL | 3 refills | Status: DC

## 2021-03-07 MED ORDER — LOKELMA 5 G PO PACK: 5.0000 g | PACK | Freq: Once | ORAL | 0 refills | Status: AC

## 2021-03-07 NOTE — Progress Notes (Signed)
Potassium level is high. Take Lokelma 5g one time for hyperkalemia. Take this  meds  2hrs before or 2hrs after other meds. patient should have labs done in 1 week to recheck potassium level. I have ordered BMP to be done in one week .   LDL is not at goal . Pt should start taking simvastatin 10mg  daily. Follow up in 6 weeks to recheck labs. Pls schedule a 6 weeks follow up  appointment to recheck labs.   A1C went up to 7.1. avoid sugar, juice, cakes ,   Awaiting urine culture result.

## 2021-03-08 ENCOUNTER — Other Ambulatory Visit: Payer: Self-pay | Admitting: Nurse Practitioner

## 2021-03-08 DIAGNOSIS — N3000 Acute cystitis without hematuria: Secondary | ICD-10-CM

## 2021-03-08 MED ORDER — SULFAMETHOXAZOLE-TRIMETHOPRIM 800-160 MG PO TABS: 1.0000 | ORAL_TABLET | Freq: Two times a day (BID) | ORAL | 0 refills | Status: DC

## 2021-03-08 NOTE — Progress Notes (Signed)
I sent in a prescription for Bactrim ds Take 1 tablet 2 times daily for 5 days.  Drink water to stay hydrated. Thanks.

## 2021-03-09 LAB — URINE CULTURE

## 2021-03-10 ENCOUNTER — Telehealth: Payer: Self-pay

## 2021-03-10 NOTE — Telephone Encounter (Signed)
Patient daughter [name] Daughter in law mother calling returning call about lab results, please contact kay about lab / test results and what the next steps to take.  Call back  # [phone].

## 2021-03-10 NOTE — Telephone Encounter (Signed)
Phone # [phone]

## 2021-03-10 NOTE — Telephone Encounter (Signed)
Called number provided on note this number is out of service

## 2021-03-12 ENCOUNTER — Other Ambulatory Visit: Payer: Self-pay | Admitting: Internal Medicine

## 2021-03-13 ENCOUNTER — Other Ambulatory Visit: Payer: Self-pay | Admitting: Internal Medicine

## 2021-03-18 NOTE — Telephone Encounter (Signed)
Called number provided number disconnected

## 2021-03-20 ENCOUNTER — Other Ambulatory Visit: Payer: Self-pay

## 2021-03-20 ENCOUNTER — Ambulatory Visit: Payer: Medicare Other | Admitting: Orthopedic Surgery

## 2021-03-20 DIAGNOSIS — M25561 Pain in right knee: Secondary | ICD-10-CM

## 2021-03-20 DIAGNOSIS — M25562 Pain in left knee: Secondary | ICD-10-CM | POA: Diagnosis not present

## 2021-03-20 DIAGNOSIS — G8929 Other chronic pain: Secondary | ICD-10-CM

## 2021-03-20 NOTE — Progress Notes (Signed)
°  Chief Complaint  Patient presents with   Knee Pain    Bilateral/would like bilat injections    Melody is 37 she gets injections in her knees she says they work really well but she waited too long this time she would like them repeated  Procedure note for bilateral knee injections  Procedure note left knee injection verbal consent was obtained to inject left knee joint  Timeout was completed to confirm the site of injection  The medications used were 40 mg depomedrol and 3 cc of 1% lidocaine  Anesthesia was provided by ethyl chloride and the skin was prepped with alcohol.  After cleaning the skin with alcohol a 20-gauge needle was used to inject the left knee joint. There were no complications. A sterile bandage was applied.   Procedure note right knee injection verbal consent was obtained to inject right knee joint  Timeout was completed to confirm the site of injection  The medications used were 40 mg depomedrol and 3 cc of 1% lidocaine  Anesthesia was provided by ethyl chloride and the skin was prepped with alcohol.  After cleaning the skin with alcohol a 20-gauge needle was used to inject the right knee joint. There were no complications. A sterile bandage was applied.

## 2021-03-20 NOTE — Telephone Encounter (Signed)
Called number disconnected

## 2021-03-25 ENCOUNTER — Other Ambulatory Visit: Payer: Self-pay

## 2021-03-25 ENCOUNTER — Encounter: Payer: Self-pay | Admitting: Adult Health

## 2021-03-25 ENCOUNTER — Ambulatory Visit: Payer: Medicare Other

## 2021-03-25 ENCOUNTER — Ambulatory Visit: Payer: Medicare Other | Admitting: Adult Health

## 2021-03-25 VITALS — BP 137/64 | HR 73 | Ht 65.0 in | Wt 202.0 lb

## 2021-03-25 DIAGNOSIS — G5601 Carpal tunnel syndrome, right upper limb: Secondary | ICD-10-CM | POA: Diagnosis not present

## 2021-03-25 DIAGNOSIS — G40219 Localization-related (focal) (partial) symptomatic epilepsy and epileptic syndromes with complex partial seizures, intractable, without status epilepticus: Secondary | ICD-10-CM

## 2021-03-25 DIAGNOSIS — R42 Dizziness and giddiness: Secondary | ICD-10-CM

## 2021-03-25 DIAGNOSIS — I619 Nontraumatic intracerebral hemorrhage, unspecified: Secondary | ICD-10-CM

## 2021-03-25 DIAGNOSIS — E1165 Type 2 diabetes mellitus with hyperglycemia: Secondary | ICD-10-CM

## 2021-03-25 LAB — HM DIABETES EYE EXAM

## 2021-03-25 MED ORDER — LEVETIRACETAM 500 MG PO TABS: 500.0000 mg | ORAL_TABLET | Freq: Two times a day (BID) | ORAL | 5 refills | Status: DC

## 2021-03-25 MED ORDER — GLIPIZIDE 5 MG PO TABS: ORAL_TABLET | ORAL | 2 refills | Status: DC

## 2021-03-25 NOTE — Patient Instructions (Addendum)
Decrease keppra to 500mg  twice daily for seizure prevention - please monitor for any seizure activity  Continue aspirin 81mg  daily for stroke prevention     Followup in the future with me in 6 months or call earlier if needed       Thank you for coming to see at [facility]. I hope we have been able to provide you high quality care today.  You may receive a patient satisfaction survey over the next few weeks. We would appreciate your feedback and comments so that we may continue to improve ourselves and the health of our patients.    Seizure, Adult A seizure is a sudden burst of abnormal electrical and chemical activity in the brain. Seizures usually last from 30 seconds to 2 minutes.  What are the causes? Common causes of this condition include: Fever or infection. Problems that affect the brain. These may include: A brain or head injury. Bleeding in the brain. A brain tumor. Low levels of blood sugar or salt. Kidney problems or liver problems. Conditions that are passed from parent to child (are inherited). Problems with a substance, such as: Having a reaction to a drug or a medicine. Stopping the use of a substance all of a sudden (withdrawal). A stroke. Disorders that affect how you develop. Sometimes, the cause may not be known.  What increases the risk? Having someone in your family who has epilepsy. In this condition, seizures happen again and again over time. They have no clear cause. Having had a tonic-clonic seizure before. This type of seizure causes you to: Tighten the muscles of the whole body. Lose consciousness. Having had a head injury or strokes before. Having had a lack of oxygen at birth. What are the signs or symptoms? There are many types of seizures. The symptoms vary depending on the type of seizure you have. Symptoms during a seizure Shaking that you cannot control (convulsions) with fast, jerky movements of  muscles. Stiffness of the body. Breathing problems. Feeling mixed up (confused). Staring or not responding to sound or touch. Head nodding. Eyes that blink, flutter, or move fast. Drooling, grunting, or making clicking sounds with your mouth Losing control of when you pee or poop. Symptoms before a seizure Feeling afraid, nervous, or worried. Feeling like you may vomit. Feeling like: You are moving when you are not. Things around you are moving when they are not. Feeling like you saw or heard something before (dj vu). Odd tastes or smells. Changes in how you see. You may see flashing lights or spots. Symptoms after a seizure Feeling confused. Feeling sleepy. Headache. Sore muscles. How is this treated? If your seizure stops on its own, you will not need treatment. If your seizure lasts longer than 5 minutes, you will normally need treatment. Treatment may include: Medicines given through an IV tube. Avoiding things, such as medicines, that are known to cause your seizures. Medicines to prevent seizures. A device to prevent or control seizures. Surgery. A diet low in carbohydrates and high in fat (ketogenic diet). Follow these instructions at home: Medicines Take over-the-counter and prescription medicines only as told by your doctor. Avoid foods or drinks that may keep your medicine from working, such as alcohol. Activity Follow instructions about driving, swimming, or doing things that would be dangerous if you had another seizure. Wait until your doctor says it is safe for you to do these things. If you live in the [place] ask your local department of motor vehicles  when you can drive. Get a lot of rest. Teaching others  Teach friends and family what to do when you have a seizure. They should: Help you get down to the ground. Protect your head and body. Loosen any clothing around your neck. Turn you on your side. Know whether or not you need emergency care. Stay with  you until you are better. Also, tell them what not to do if you have a seizure. Tell them: They should not hold you down. They should not put anything in your mouth. General instructions Avoid anything that gives you seizures. Keep a seizure diary. Write down: What you remember about each seizure. What you think caused each seizure. Keep all follow-up visits. Contact a doctor if: You have another seizure or seizures. Call the doctor each time you have a seizure. The pattern of your seizures changes. You keep having seizures with treatment. You have symptoms of being sick or having an infection. You are not able to take your medicine. Get help right away if: You have any of these problems: A seizure that lasts longer than 5 minutes. Many seizures in a row and you do not feel better between seizures. A seizure that makes it harder to breathe. A seizure and you can no longer speak or use part of your body. You do not wake up right after a seizure. You get hurt during a seizure. You feel confused or have pain right after a seizure. These symptoms may be an emergency. Get help right away. Call your local emergency services (911 in the [place] Do not wait to see if the symptoms will go away. Do not drive yourself to the hospital. Summary A seizure is a sudden burst of abnormal electrical and chemical activity in the brain. Seizures normally last from 30 seconds to 2 minutes. Causes of seizures include illness, injury to the head, low levels of blood sugar or salt, and certain conditions. Most seizures will stop on their own in less than 5 minutes. Seizures that last longer than 5 minutes are a medical emergency and need treatment right away. Many medicines are used to treat seizures. Take over-the-counter and prescription medicines only as told by your doctor. This information is not intended to replace advice given to you by your health care provider. Make sure you discuss any questions  you have with your health care provider. Document Revised: 07/21/2019 Document Reviewed: 07/21/2019 Elsevier Patient Education  2022 [employer].

## 2021-03-25 NOTE — Progress Notes (Signed)
[facility] [address] [place] [id number] [phone]       OFFICE FOLLOW UP VISIT NOTE  [name] Date of Birth:  05/15/1932 Medical Record Number:  [id number]   Referring MD:  Tereasa CoopHannah mills, FNP  Reason for Referral:   stroke  Chief Complaint  Patient presents with   Follow-up    RM 3 with friend Anna Robbins Pt is well, has been having some dizziness recently, no sz since last visit. Wrist has improved a lot but some tingling on fingers tips. 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  Returns today for follow-up visit accompanied by her friend, Anna Robbins. Multiple concerns at todays visit.   Largest complaint is in regards to gradual memory decline. Per chart review, PCP made mention of memory loss concerns over the past year from family at initial visit in 12/2019. More present since stroke in 2020 and unsure if some memory issues prior to stroke. Daughter does medications (per patient, unable to hold pills to take but this is not correct per her friend), sons manages bill paying, does need assistance with cleaning. Able to maintain ADLs independently. Per friend, occasional confusion, mixing up peoples name, forgetting dates. MMSE today 26/30 (missed 3 points for attention/calculation and [place] where she resides but not currently located)  Reports episode of dizziness this morning - feels like this could have been from the cortisone injection in her knee which was done on 2/23. Does have chronic dizziness intermittently. Did not check blood pressure or glucose levels this morning. Resolved after eating.   Mentions left sided head pain - reports onset 2 weeks ago after she hit her head on her grandsons car door although friend clarifies this occurred back in  December. She fixates on an area on the left side of her head which is slightly indented but also similar on right side. Has been gradually improving. Denies changes in vision or any other associated symptoms since hitting her head.   EMG/NCV confirmed right carpal tunnel syndrome with current use of conservative measures with use of wrist splint and gabapentin which she is tolerating without side effects. Reports improvement of numbness and pain - still has some mild numbness in in finger tips. Able to do more with right hand.   Overall stable from stroke standpoint without new stroke/TIA symptoms.  Compliant on aspirin 81 mg daily and simvastatin 10 mg daily without side effects.  Blood pressure today 137/64.  Remains on Keppra 750 mg twice daily for seizure prophylaxis -denies any seizure activity. Friend does ask if this medication is still needed. Does note running out of keppra previously (abruptly stopped) with worsening of RLS but no mention of seizures (although per chart review, concerns of seizures after abruptly stopping per patient report).       History provided for reference purposes only Update 09/16/2020 Dr. Pearlean BrownieSethi; she returns for follow-up after last visit on 02/20/2020.  She missed her follow-up appointment in between due to excess exposure to COVID and trouble making earlier follow-up visits.  She is accompanied by her friend Anna Robbins today.  Patient continues to have some diminished strength in the right hand with trouble with fine motor skills and often drops objects.  This and difficulties seem to be more prominent in the night when she has  paresthesias as well.  She states she is tried wearing carpal tunnel splints in the past but not recently and it did not help her.  She did undergo MRI scan cervical spine on 03/06/2020 which showed minor disc bulges but no definite cord compression or significant due to foraminal encroachment.  MRI scan of the brain was also done which showed old right  frontal parasagittal hemorrhagic infarct with remote age blood products.  She denies any new symptoms of stroke or TIAs.  She remains on aspirin she is tolerating well without bruising or bleeding.  His blood pressure is usually well controlled today in office it is 147/77.  She has no other new complaints.  She does have restless leg syndrome and takes gabapentin 300 mg at night.  She has never tried a higher dose  Initial visit 02/20/2020 Dr. Pearlean BrownieSethi: [name] is a 88 year old pleasant Caucasian lady seen today for initial office consultation visit.  She is accompanied by a friend Garment/textile technologistKay.  History is obtained from them, review of referral notes and was unable to personally review imaging films or reports in PACS.  She has past medical history of diabetes, hypertension, hyperlipidemia and pulmonary emboli.  She was on long-term anticoagulation but is unable to give me details.  She developed what appears to be hemorrhagic stroke in October 2020 while in [place].  She was admitted to [facility] in [place].  Unfortunately I do not have actual records from there for review today she states she had some right-sided weakness and diminished fine motor skills and was off balance and barely able to walk.  She was transferred to inpatient rehab facility for 5 weeks as she gradually got better.  This stroke happened a few weeks after her son died suddenly.  Patient was apparently on anticoagulation for pulmonary embolism which was stopped after this episode of hemorrhagic stroke.  She patient was [place] close to her other son.  She is currently moving with her son and his in-laws as she is no longer able to live alone.  She also had apparently seizures following her hemorrhagic stroke and was started on Keppra.  She was supposed to be on it only for short time and when she tried to wean herself off it she had tremulousness and jerking on the right side and Keppra was  resumed thinking the episodes were seizures.  Patient with has also been complaining of numbness and tingling in the right hand particularly the left fingers for quite some time.  She feels this may have gotten worse after her stroke.  She does complain of stiffness in the neck and restriction of neck movements to the right.  She has never been evaluated with MRI of the neck to see if she has radiculopathy or spinal stenosis.     ROS:   14 system review of systems is positive for multiple concerns as listed above and all other systems negative PMH:  Past Medical History:  Diagnosis Date   Asthma    Diabetes mellitus without complication (HCC)    Kidney stones 07/2017   required sx   Pulmonary emboli (HCC) 06/2017   Seizures (HCC)    Stroke [facility](HCC)     Social History:  Social History   Socioeconomic History   Marital status: Widowed    Spouse name: Not on file   Number of children: Not on file   Years of education: Not on file   Highest education level:  Not on file  Occupational History   Not on file  Tobacco Use   Smoking status: Never   Smokeless tobacco: Never  Vaping Use   Vaping Use: Never used  Substance and Sexual Activity   Alcohol use: Not Currently   Drug use: Not Currently   Sexual activity: Not Currently  Other Topics Concern   Not on file  Social History Narrative   Lives with daughter and son in law and their 2 kids, also son in law's mother.   Left Handed   Driinks 1-2 cups caffeine weekly   Social Determinants of Health   Financial Resource Strain: Low Risk    Difficulty of Paying Living Expenses: Not hard at all  Food Insecurity: No Food Insecurity   Worried About Programme researcher, broadcasting/film/video in the Last Year: Never true   Barista in the Last Year: Never true  Transportation Needs: No Transportation Needs   Lack of Transportation (Medical): No   Lack of Transportation (Non-Medical): No  Physical Activity: Inactive   Days of Exercise per Week: 0  days   Minutes of Exercise per Session: 0 min  Stress: No Stress Concern Present   Feeling of Stress : Only a little  Social Connections: Moderately Isolated   Frequency of Communication with Friends and Family: More than three times a week   Frequency of Social Gatherings with Friends and Family: More than three times a week   Attends Religious Services: More than 4 times per year   Active Member of [employer] or Organizations: No   Attends Banker Meetings: Never   Marital Status: Widowed  Catering manager Violence: Not At Risk   Fear of Current or Ex-Partner: No   Emotionally Abused: No   Physically Abused: No   Sexually Abused: No    Medications:   Current Outpatient Medications on File Prior to Visit  Medication Sig Dispense Refill   acetaminophen (TYLENOL) 500 MG tablet Take 500-1,000 mg by mouth every 6 (six) hours as needed.     albuterol (VENTOLIN HFA) 108 (90 Base) MCG/ACT inhaler Inhale into the lungs every 6 (six) hours as needed for wheezing or shortness of breath.     amLODipine (NORVASC) 5 MG tablet Take 1.5 tablets (7.5 mg total) by mouth daily. 135 tablet 3   aspirin EC 81 MG tablet Take 1 tablet (81 mg total) by mouth daily. Swallow whole. 30 tablet 11   DULoxetine (CYMBALTA) 20 MG capsule Take 1 capsule (20 mg total) by mouth daily. 90 capsule 1   famotidine (PEPCID) 20 MG tablet One after supper 30 tablet 11   gabapentin (NEURONTIN) 300 MG capsule Take 2 capsules (600 mg total) by mouth at bedtime. 180 capsule 1   hydrocortisone (ANUSOL-HC) 25 MG suppository Place 1 suppository (25 mg total) rectally 2 (two) times daily. 12 suppository 0   Ketoconazole-Hydrocortisone 2-2.5 % CREA Apply 1 application topically daily. 30 g 0   levETIRAcetam (KEPPRA) 750 MG tablet Take 1 tablet (750 mg total) by mouth 2 (two) times daily. 180 tablet 1   loperamide (IMODIUM) 2 MG capsule Take 2 mg by mouth daily.     metFORMIN (GLUCOPHAGE) 1000 MG tablet Take 1 tablet (1,000 mg  total) by mouth 2 (two) times daily with a meal. 180 tablet 1   nystatin (MYCOSTATIN/NYSTOP) powder Apply 1 application topically 3 (three) times daily. 45 g 1   oxycodone (OXY-IR) 5 MG capsule Take 5 mg by mouth every 4 (four)  hours as needed.     pantoprazole (PROTONIX) 40 MG tablet TAKE 1 TABLET(40 MG) BY MOUTH DAILY 30 TO 60 MINUTES BEFORE FIRST MEAL OF THE DAY 90 tablet 1   simvastatin (ZOCOR) 10 MG tablet Take 1 tablet (10 mg total) by mouth at bedtime. 90 tablet 3   sulfamethoxazole-trimethoprim (BACTRIM DS) 800-160 MG tablet Take 1 tablet by mouth 2 (two) times daily. 10 tablet 0   Vibegron (GEMTESA) 75 MG TABS Take 1 tablet by mouth daily. 30 tablet 2   No current facility-administered medications on file prior to visit.    Allergies:   Allergies  Allergen Reactions   Asa [Aspirin]    Diphenhydramine Hcl    Lisinopril     cough   Melatonin    Molds & Smuts    Nsaids    Other    Penicillin G    Penicillins    Tramadol     Physical Exam Today's Vitals   03/25/21 1331  BP: 137/64  Pulse: 73  Weight: 202 lb (91.6 kg)  Height: 5\' 5"  (1.651 m)   Body mass index is 33.61 kg/m.   General: well developed, well nourished elderly Caucasian lady, seated, in no evident distress Head: head normocephalic and atraumatic.   Neck: supple with no carotid or supraclavicular bruits Cardiovascular: regular rate and rhythm, no murmurs Musculoskeletal: Mild kyphoscoliosis Skin:  no rash/petichiae Vascular:  Normal pulses all extremities  Neurologic Exam Mental Status: Awake and fully alert. Oriented to place and time. Recent memory impaired and remote memory intact. Attention span, concentration and fund of knowledge mostly appropriate during visit. Mood and affect appropriate.  MMSE - Mini Mental State Exam 03/25/2021  Orientation to time 5  Orientation to Place 4  Registration 3  Attention/ Calculation 2  Recall 3  Language- name 2 objects 2  Language- repeat 1  Language-  follow 3 step command 3  Language- read & follow direction 1  Write a sentence 1  Copy design 1  Total score 26   Cranial Nerves: Pupils equal, briskly reactive to light. Extraocular movements full without nystagmus. Visual fields full to confrontation. Hearing diminished bilaterally. Facial sensation intact. Face, tongue, palate moves normally and symmetrically.  Motor: Normal bulk and tone. Normal strength in all tested extremity muscles.  Mild weakness of intrinsic hand muscles in the right hand particularly over the ulnar fingers Sensory.: intact to touch , pinprick , position and vibratory sensation.  Except slight hyperesthesia over the tips of the ulnar mediated fingers in the right hand.   Coordination: Rapid alternating movements normal in all extremities. Finger-to-nose and heel-to-shin performed accurately bilaterally. Gait and Station: Arises from chair with difficulty. Stance is stooped.. Uses a Rollator. Tandem walk and heel toe not attempted Reflexes: 1+ and symmetric. Toes downgoing.       ASSESSMENT/PLAN: 88 year old Caucasian lady with hemorrhagic stroke in October 2020 possibly hypertensive versus anticoagulation related  followed by symptomatic seizures.  Vascular risk factors of diabetes, hypertension, hyperlipidemia.  Chronic right hand paresthesias possibly from carpal tunnel as well as multiple other issues as below    Hx of ICH with symptomatic seizures -Continue aspirin 81 mg daily for secondary stroke prevention measures -as EEG unremarkable and no unremarkable and no recent seizures, will decrease keppra dose to 500mg  twice daily. She does not drive and constantly has supervision of family.  Discussed monitoring for seizure activity and to call with any concerns.  -Continue close PCP follow-up for aggressive stroke risk factor management  and monitoring  Memory loss -Family notes gradual decline more since since her stroke in 10/2018 -MMSE today 26/30 -would  speak off topic during visit and some inaccuracies reported  - suspect mild cognitive impairment from prior stroke and age related. Also had hx of sleep apnea not treated which could be contributing.  -EEG 02/2020 unremarkable - MR brain 02/2020 no worrisome findings, old stroke noted  Carpal tunnel syndrome -Confirmed on NCV/EMG 10/2020 -Continue wrist splints and gabapentin 600 mg nightly  Left sided head pain -normal exam with gradual improvement. Continue to monitor   Dizziness -chronic issue -episode this AM resolved after eating - advised to ensure she checks blood sugar and blood pressure with these symptoms. Ensure adequate water intake. Can continue to be monitored by PCP    Follow up in 6 months or call earlier if needed     CC:  [facility] provider: Dr. Carlene Coria, Baird Kay, FNP   I spent >60 minutes of face-to-face and non-face-to-face time with patient and friend.  This included previsit chart review, lab review, study review, order entry, electronic health record documentation, patient and friend education and discussion regarding history of prior stroke, secondary stroke prevention measures and aggressive stroke risk factor management, other multiple concerns as noted above with extensive conversation and answered all the questions to patient and friends satisfaction  Ihor Austin, AGNP-BC  [facility] [address] [place], [place] [address]  Phone [phone] Fax [phone] Note: This document was prepared with digital dictation and possible smart phrase technology. Any transcriptional errors that result from this process are unintentional.

## 2021-03-26 ENCOUNTER — Other Ambulatory Visit: Payer: Self-pay | Admitting: Nurse Practitioner

## 2021-03-26 ENCOUNTER — Other Ambulatory Visit: Payer: Self-pay | Admitting: Internal Medicine

## 2021-03-26 DIAGNOSIS — E1165 Type 2 diabetes mellitus with hyperglycemia: Secondary | ICD-10-CM

## 2021-03-26 DIAGNOSIS — F320 Major depressive disorder, single episode, mild: Secondary | ICD-10-CM

## 2021-03-27 NOTE — Progress Notes (Signed)
I agree with the above plan 

## 2021-04-18 ENCOUNTER — Ambulatory Visit (INDEPENDENT_AMBULATORY_CARE_PROVIDER_SITE_OTHER): Payer: Medicare Other | Admitting: Nurse Practitioner

## 2021-04-18 ENCOUNTER — Encounter: Payer: Self-pay | Admitting: Nurse Practitioner

## 2021-04-18 ENCOUNTER — Other Ambulatory Visit: Payer: Self-pay

## 2021-04-18 VITALS — BP 138/56 | HR 82 | Ht 65.0 in | Wt 201.0 lb

## 2021-04-18 DIAGNOSIS — E785 Hyperlipidemia, unspecified: Secondary | ICD-10-CM | POA: Diagnosis not present

## 2021-04-18 DIAGNOSIS — L989 Disorder of the skin and subcutaneous tissue, unspecified: Secondary | ICD-10-CM

## 2021-04-18 DIAGNOSIS — E875 Hyperkalemia: Secondary | ICD-10-CM | POA: Diagnosis not present

## 2021-04-18 DIAGNOSIS — I10 Essential (primary) hypertension: Secondary | ICD-10-CM | POA: Diagnosis not present

## 2021-04-18 NOTE — Progress Notes (Signed)
? ?  Anna Robbins     MRN: [id number]      DOB: [birth year] ? ? ?HPI ?[name] with past medical history of type 2 diabetes, essential hypertension, hyperlipidemia, CVA, osteoarthritis of both knees, memory changes is here for follow up for hyperlipidemia.  She has been taking simvastatin 10 mg daily as ordered. ? ?Patient complains of lesion on her abdominal folds, states that the lesion has been there for years.  Denies itching fever chills bleeding.  ? ? ? ?ROS ?Denies recent fever or chills. ?Denies sinus pressure, nasal congestion, ear pain or sore throat. ?Denies chest congestion, productive cough or wheezing. ?Denies chest pains, palpitations and leg swelling ?Denies abdominal pain, nausea, vomiting,diarrhea or constipation.   ?Denies joint pain, swelling and limitation in mobility. ?Denies headaches, seizures, numbness, or tingling. ?Denies depression, anxiety or insomnia. ? ? ? ?PE ? ?BP (!) 138/56 (BP Location: Right Arm, Cuff Size: Large)   Pulse 82   Ht 5\' 5"  (1.651 m)   Wt 201 lb (91.2 kg)   SpO2 94%   BMI 33.45 kg/m?  ? ?Patient alert and oriented and in no cardiopulmonary distress. ? ? ?Chest: Clear to auscultation bilaterally. ? ?CVS: S1, S2 no murmurs, no S3.Regular rate. ? ?ABD: Soft non tender.  ? ?Ext: No edema ? ?MS: limited  ROM spine, shoulders, hips and knees, using an assistive walking device ? ?Skin: non bleeding brown colored fleshy skin Lesions noted on the lower abdominal fold region ? ?Psych: Good eye contact, normal affect. Memory intact not anxious or depressed appearing. ? ? ?Assessment & Pla ?Hyperlipidemia ?Lab Results  ?Component Value Date  ? CHOL 142 03/05/2021  ? HDL 52 03/05/2021  ? LDLCALC 70 03/05/2021  ? TRIG 112 03/05/2021  ? CHOLHDL 2.7 03/05/2021  ?taking simvastatin 10mg  daily  ?Check lipid panel ?goal is LDL less than 55 due to history of CVA. ? ?Essential hypertension ?BP Readings from Last 3 Encounters:  ?04/18/21 (!) 138/56  ?03/25/21 137/64  ?03/04/21  130/68  ?continue  amlodipine 5mg  daily  ?DASH diet advised.  ? ? ?Hyperkalemia ?Lab Results  ?Component Value Date  ? NA 141 03/05/2021  ? K 5.4 (H) 03/05/2021  ? CO2 22 03/05/2021  ? BUN 23 03/05/2021  ? CREATININE 0.90 03/05/2021  ? CALCIUM 10.1 03/05/2021  ? GLUCOSE 149 (H) 03/05/2021  ?pt  was given 05/03/2021, recheck BMP today.  ? ?Skin lesion ?Chronic condition ?Lesions none bleeding, brownish in color  ?Some looked like skin tag ?Referred to dermatology for further evaluation   ? ?

## 2021-04-18 NOTE — Patient Instructions (Addendum)

## 2021-04-18 NOTE — Assessment & Plan Note (Addendum)
BP Readings from Last 3 Encounters:  ?04/18/21 (!) 138/56  ?03/25/21 137/64  ?03/04/21 130/68  ?continue  amlodipine 5mg  daily  ?DASH diet advised.  ? ?

## 2021-04-18 NOTE — Assessment & Plan Note (Addendum)
Lab Results  ?Component Value Date  ? NA 141 03/05/2021  ? K 5.4 (H) 03/05/2021  ? CO2 22 03/05/2021  ? BUN 23 03/05/2021  ? CREATININE 0.90 03/05/2021  ? CALCIUM 10.1 03/05/2021  ? GLUCOSE 149 (H) 03/05/2021  ?pt  was given [facility] Caul, recheck BMP today.  ?

## 2021-04-18 NOTE — Assessment & Plan Note (Signed)
Lab Results  ?Component Value Date  ? CHOL 142 03/05/2021  ? HDL 52 03/05/2021  ? LDLCALC 70 03/05/2021  ? TRIG 112 03/05/2021  ? CHOLHDL 2.7 03/05/2021  ?taking simvastatin 10mg  daily  ?Check lipid panel ?goal is LDL less than 55 due to history of CVA. ?

## 2021-04-18 NOTE — Assessment & Plan Note (Signed)
Chronic condition ?Lesions none bleeding, brownish in color  ?Some looked like skin tag ?Referred to dermatology for further evaluation  ?

## 2021-04-19 ENCOUNTER — Other Ambulatory Visit: Payer: Self-pay | Admitting: Nurse Practitioner

## 2021-04-19 DIAGNOSIS — I10 Essential (primary) hypertension: Secondary | ICD-10-CM

## 2021-04-19 LAB — LIPID PANEL
Chol/HDL Ratio: 2 ratio (ref 0.0–4.4)
Cholesterol, Total: 135 mg/dL (ref 100–199)
HDL: 69 mg/dL (ref >39)
LDL Chol Calc (NIH): 54 mg/dL (ref 0–99)
Triglycerides: 56 mg/dL (ref 0–149)
VLDL Cholesterol Cal: 12 mg/dL (ref 5–40)

## 2021-04-19 LAB — BASIC METABOLIC PANEL
BUN/Creatinine Ratio: 19 (ref 12–28)
BUN: 20 mg/dL (ref 8–27)
CO2: 25 mmol/L (ref 20–29)
Calcium: 9.8 mg/dL (ref 8.7–10.3)
Chloride: 106 mmol/L (ref 96–106)
Creatinine, Ser: 1.03 mg/dL — ABNORMAL HIGH (ref 0.57–1.00)
Glucose: 172 mg/dL — ABNORMAL HIGH (ref 70–99)
Potassium: 4.9 mmol/L (ref 3.5–5.2)
Sodium: 144 mmol/L (ref 134–144)
eGFR: 53 mL/min/{1.73_m2} — ABNORMAL LOW (ref >59)

## 2021-04-19 NOTE — Progress Notes (Signed)
LDL is at goal continue current medications. ? ?Kidney function slightly declined, patient should avoid ibuprofen, Aleve drink at least 64 ounces of water daily, recheck labs in one month. Pt should come to the office to get labs done to recheck her kidney function in a month

## 2021-04-30 NOTE — Progress Notes (Signed)
Normal diabetic eye exam, repeat in 1 year

## 2021-05-12 DIAGNOSIS — H01002 Unspecified blepharitis right lower eyelid: Secondary | ICD-10-CM | POA: Diagnosis not present

## 2021-05-12 DIAGNOSIS — H353131 Nonexudative age-related macular degeneration, bilateral, early dry stage: Secondary | ICD-10-CM | POA: Diagnosis not present

## 2021-05-12 DIAGNOSIS — H01004 Unspecified blepharitis left upper eyelid: Secondary | ICD-10-CM | POA: Diagnosis not present

## 2021-05-12 DIAGNOSIS — H01001 Unspecified blepharitis right upper eyelid: Secondary | ICD-10-CM | POA: Diagnosis not present

## 2021-05-15 DIAGNOSIS — L820 Inflamed seborrheic keratosis: Secondary | ICD-10-CM | POA: Diagnosis not present

## 2021-05-15 DIAGNOSIS — L304 Erythema intertrigo: Secondary | ICD-10-CM | POA: Diagnosis not present

## 2021-05-22 DIAGNOSIS — I10 Essential (primary) hypertension: Secondary | ICD-10-CM | POA: Diagnosis not present

## 2021-05-23 LAB — BASIC METABOLIC PANEL
BUN/Creatinine Ratio: 21 (ref 12–28)
BUN: 20 mg/dL (ref 8–27)
CO2: 18 mmol/L — ABNORMAL LOW (ref 20–29)
Calcium: 9.8 mg/dL (ref 8.7–10.3)
Chloride: 98 mmol/L (ref 96–106)
Creatinine, Ser: 0.96 mg/dL (ref 0.57–1.00)
Glucose: 176 mg/dL — ABNORMAL HIGH (ref 70–99)
Potassium: 5 mmol/L (ref 3.5–5.2)
Sodium: 139 mmol/L (ref 134–144)
eGFR: 57 mL/min/{1.73_m2} — ABNORMAL LOW (ref >59)

## 2021-05-23 NOTE — Progress Notes (Signed)
Kidney function much improved, patient should drink at least 64 ounces of water daily, avoid ibuprofen Aleve.

## 2021-06-11 ENCOUNTER — Other Ambulatory Visit: Payer: Self-pay | Admitting: Internal Medicine

## 2021-06-25 ENCOUNTER — Other Ambulatory Visit: Payer: Self-pay | Admitting: *Deleted

## 2021-06-25 DIAGNOSIS — G40219 Localization-related (focal) (partial) symptomatic epilepsy and epileptic syndromes with complex partial seizures, intractable, without status epilepticus: Secondary | ICD-10-CM

## 2021-06-25 MED ORDER — LEVETIRACETAM 500 MG PO TABS: 500.0000 mg | ORAL_TABLET | Freq: Two times a day (BID) | ORAL | 5 refills | Status: DC

## 2021-07-07 ENCOUNTER — Encounter: Payer: Self-pay | Admitting: Nurse Practitioner

## 2021-07-07 ENCOUNTER — Ambulatory Visit: Payer: Medicare Other | Admitting: Nurse Practitioner

## 2021-07-08 ENCOUNTER — Other Ambulatory Visit: Payer: Self-pay | Admitting: Nurse Practitioner

## 2021-07-08 DIAGNOSIS — F320 Major depressive disorder, single episode, mild: Secondary | ICD-10-CM

## 2021-07-08 MED ORDER — DULOXETINE HCL 20 MG PO CPEP
ORAL_CAPSULE | ORAL | 1 refills | Status: DC
Start: 2021-07-08 — End: 2022-01-21

## 2021-07-08 MED ORDER — GABAPENTIN 300 MG PO CAPS: 600.0000 mg | ORAL_CAPSULE | Freq: Every evening | ORAL | 1 refills | Status: DC

## 2021-07-24 ENCOUNTER — Ambulatory Visit (INDEPENDENT_AMBULATORY_CARE_PROVIDER_SITE_OTHER): Payer: Medicare Other | Admitting: Nurse Practitioner

## 2021-07-24 ENCOUNTER — Encounter: Payer: Self-pay | Admitting: Nurse Practitioner

## 2021-07-24 VITALS — BP 120/76 | HR 83 | Ht 65.0 in | Wt 202.0 lb

## 2021-07-24 DIAGNOSIS — J309 Allergic rhinitis, unspecified: Secondary | ICD-10-CM

## 2021-07-24 DIAGNOSIS — N1831 Chronic kidney disease, stage 3a: Secondary | ICD-10-CM | POA: Diagnosis not present

## 2021-07-24 DIAGNOSIS — E782 Mixed hyperlipidemia: Secondary | ICD-10-CM

## 2021-07-24 DIAGNOSIS — I209 Angina pectoris, unspecified: Secondary | ICD-10-CM

## 2021-07-24 DIAGNOSIS — I10 Essential (primary) hypertension: Secondary | ICD-10-CM

## 2021-07-24 DIAGNOSIS — F320 Major depressive disorder, single episode, mild: Secondary | ICD-10-CM

## 2021-07-24 DIAGNOSIS — E1165 Type 2 diabetes mellitus with hyperglycemia: Secondary | ICD-10-CM | POA: Diagnosis not present

## 2021-07-24 DIAGNOSIS — N183 Chronic kidney disease, stage 3 unspecified: Secondary | ICD-10-CM | POA: Insufficient documentation

## 2021-07-24 MED ORDER — AMLODIPINE BESYLATE 5 MG PO TABS: 5.0000 mg | ORAL_TABLET | Freq: Every day | ORAL | 1 refills | Status: DC

## 2021-07-24 MED ORDER — FLUTICASONE PROPIONATE 50 MCG/ACT NA SUSP: 2.0000 | Freq: Every day | NASAL | 6 refills | Status: DC

## 2021-07-24 NOTE — Progress Notes (Signed)
   Anna Robbins     MRN: [id number]      DOB: November 23, 1932   HPI Ms. Quentin Cornwall with past medical history of essential hypertension, cough variant asthma, type 2 diabetes, seizure disorder, i CVA, hyperlipidemia s here for follow up and re-evaluation of chronic medical conditions, medication management  Hypertension .currently on amlodipine 7.5 mg daily .patient stated that she has been feeling dizziness since about a week ago, denies fever, chills, edema, chest pain.   Allergic rhinitis .patient complains of chronic nonproductive cough, has stuffy nose sometimes, running nose all the time, symptoms worse in recent times.  Denies fever, chills, wheezing.   Type 2 diabetes.  Currently on metformin 1000 mg twice daily, glipizide 5 mg twice daily.  She denies hypoglycemic episodes.    ROS Denies recent fever or chills. Denies sinus pressure, ear pain or sore throat. Denies chest congestion, productive cough or wheezing. Denies chest pains, palpitations and leg swelling Denies abdominal pain, nausea, vomiting,diarrhea or constipation.   Denies dysuria, frequency, hesitancy or incontinence. Denies headaches, seizures, numbness, or tingling. Denies depression, anxiety or insomnia.    PE  BP 120/76   Pulse 83   Ht _0  (1.651 m)   Wt 202 lb (91.6 kg)   SpO2 91%   BMI 33.61 kg/m   Patient alert and oriented and in no cardiopulmonary distress.  HEENT: No facial asymmetry, EOMI,     Neck supple .  Chest: Clear to auscultation bilaterally.  CVS: S1, S2 no murmurs, no S3.Regular rate.  ABD: Soft non tender.   Ext: No edema  MS: Decreased ROM spine, shoulders, hips and knees.  Psych: Good eye contact, normal affect. Memory intact not anxious or depressed appearing.    Assessment & Plan  Essential hypertension BP Readings from Last 3 Encounters:  07/24/21 120/76  04/18/21 (!) 138/56  03/25/21 137/64  On amlodipine 7.5 mg daily Decrease amlodipine to 5 mg daily due to  complaints of recent dizziness BP goal is less than 130/80 Diet advised engage in regular walking exercises as tolerated Follow-up in 4 weeks   Type 2 diabetes mellitus with hyperglycemia, without long-term current use of insulin (HCC) Lab Results  Component Value Date   HGBA1C 7.1 (H) 03/05/2021  Currently on metformin 1000 mg twice daily, glipizide 5 mg twice daily Denies hypoglycemia Check A1c, urine creatinine labs  I discussed starting patient on an SGLT2 for kidney protection since she is not on ARB or ACE Up-to-date with foot exam, eye exam  Hyperlipidemia Lab Results  Component Value Date   CHOL 135 04/18/2021   HDL 69 04/18/2021   LDLCALC 54 04/18/2021   TRIG 56 04/18/2021   CHOLHDL 2.0 04/18/2021  Chronic condition well-controlled continue simvastatin 10 mg daily Avoid fried fatty foods  Allergic rhinitis Start Flonase nasal spray take 2 spray daily Take OTC Mucinex as needed  CKD (chronic kidney disease) stage 3, GFR 30-59 ml/min (HCC) Lab Results  Component Value Date   NA 139 05/22/2021   K 5.0 05/22/2021   CO2 18 (L) 05/22/2021   GLUCOSE 176 (H) 05/22/2021   BUN 20 05/22/2021   CREATININE 0.96 05/22/2021   CALCIUM 9.8 05/22/2021   EGFR 57 (L) 05/22/2021  Chronic condition Not on ACE or ARB Plan on starting patient on Farxiga for kidney protection once lab results  Avoid NSAIDs and other nephrotoxic agents  Depression, major, single episode, mild (HCC) HQ 9 score 0 Continue Cymbalta 20 mg daily SI, HI

## 2021-07-24 NOTE — Assessment & Plan Note (Signed)
Lab Results  Component Value Date   HGBA1C 7.1 (H) 03/05/2021  Currently on metformin 1000 mg twice daily, glipizide 5 mg twice daily Denies hypoglycemia Check A1c, urine creatinine labs  I discussed starting patient on an SGLT2 for kidney protection since she is not on ARB or ACE Up-to-date with foot exam, eye exam

## 2021-07-24 NOTE — Assessment & Plan Note (Signed)
BP Readings from Last 3 Encounters:  07/24/21 120/76  04/18/21 (!) 138/56  03/25/21 137/64  On amlodipine 7.5 mg daily Decrease amlodipine to 5 mg daily due to complaints of recent dizziness BP goal is less than 130/80 Diet advised engage in regular walking exercises as tolerated Follow-up in 4 weeks

## 2021-07-24 NOTE — Assessment & Plan Note (Signed)
Start Flonase nasal spray take 2 spray daily Take OTC Mucinex as needed

## 2021-07-24 NOTE — Assessment & Plan Note (Addendum)
HQ 9 score 0 Continue Cymbalta 20 mg daily SI, HI

## 2021-07-24 NOTE — Assessment & Plan Note (Signed)
Lab Results  Component Value Date   NA 139 05/22/2021   K 5.0 05/22/2021   CO2 18 (L) 05/22/2021   GLUCOSE 176 (H) 05/22/2021   BUN 20 05/22/2021   CREATININE 0.96 05/22/2021   CALCIUM 9.8 05/22/2021   EGFR 57 (L) 05/22/2021  Chronic condition Not on ACE or ARB Plan on starting patient on Farxiga for kidney protection once lab results  Avoid NSAIDs and other nephrotoxic agents

## 2021-07-24 NOTE — Patient Instructions (Addendum)
Please use Flonase nasal spray , 2 spray daily into both nostril. Take otc mucinex as needed for cough.   Please check Blood pressure at home about 3-4 times a week. Blood pressure goal is less than 130/80.    Please get your shingles , TDAP and pneumonia vaccine at your pharmacy    It is important that you exercise regularly at least 30 minutes 5 times a week.  Think about what you will eat, plan ahead. Choose " clean, green, fresh or frozen" over canned, processed or packaged foods which are more sugary, salty and fatty. 70 to 75% of food eaten should be vegetables and fruit. Three meals at set times with snacks allowed between meals, but they must be fruit or vegetables. Aim to eat over a 12 hour period , example 7 am to 7 pm, and STOP after  your last meal of the day. Drink water,generally about 64 ounces per day, no other drink is as healthy. Fruit juice is best enjoyed in a healthy way, by EATING the fruit.  Thanks for choosing [facility], we consider it a privelige to serve you.

## 2021-07-24 NOTE — Assessment & Plan Note (Signed)
Lab Results  Component Value Date   CHOL 135 04/18/2021   HDL 69 04/18/2021   LDLCALC 54 04/18/2021   TRIG 56 04/18/2021   CHOLHDL 2.0 04/18/2021  Chronic condition well-controlled continue simvastatin 10 mg daily Avoid fried fatty foods

## 2021-07-25 ENCOUNTER — Other Ambulatory Visit: Payer: Self-pay | Admitting: Nurse Practitioner

## 2021-07-25 DIAGNOSIS — E1165 Type 2 diabetes mellitus with hyperglycemia: Secondary | ICD-10-CM

## 2021-07-25 DIAGNOSIS — N1831 Chronic kidney disease, stage 3a: Secondary | ICD-10-CM

## 2021-07-25 MED ORDER — DAPAGLIFLOZIN PROPANEDIOL 5 MG PO TABS: 5.0000 mg | ORAL_TABLET | Freq: Every day | ORAL | 1 refills | Status: DC

## 2021-07-25 NOTE — Progress Notes (Signed)
A1C is slightly improved from 7.1 to 6.9. patient should start taking faxiga 5mg  daily for kidney protection and DM, avoid fried fatty foods while taking faxiga to avoid nausea,  monitor cbg twice daily Goal for fasting blood sugar ranges from 80 to 120 and 2 hours after any meal or at bedtime should be between 130 to 170.   CBC is normal   Follw up as planned in 4 weeks

## 2021-07-26 LAB — CBC
Hematocrit: 34.7 % (ref 34.0–46.6)
Hemoglobin: 11.2 g/dL (ref 11.1–15.9)
MCH: 29.3 pg (ref 26.6–33.0)
MCHC: 32.3 g/dL (ref 31.5–35.7)
MCV: 91 fL (ref 79–97)
Platelets: 313 10*3/uL (ref 150–450)
RBC: 3.82 x10E6/uL (ref 3.77–5.28)
RDW: 12 % (ref 11.7–15.4)
WBC: 7.1 10*3/uL (ref 3.4–10.8)

## 2021-07-26 LAB — HEMOGLOBIN A1C
Est. average glucose Bld gHb Est-mCnc: 151 mg/dL
Hgb A1c MFr Bld: 6.9 % — ABNORMAL HIGH (ref 4.8–5.6)

## 2021-07-26 LAB — MICROALBUMIN / CREATININE URINE RATIO
Creatinine, Urine: 166.4 mg/dL
Microalb/Creat Ratio: 13 mg/g creat (ref 0–29)
Microalbumin, Urine: 21.9 ug/mL

## 2021-08-21 ENCOUNTER — Ambulatory Visit (INDEPENDENT_AMBULATORY_CARE_PROVIDER_SITE_OTHER): Payer: Medicare Other | Admitting: Orthopedic Surgery

## 2021-08-21 DIAGNOSIS — M25562 Pain in left knee: Secondary | ICD-10-CM | POA: Diagnosis not present

## 2021-08-21 DIAGNOSIS — G8929 Other chronic pain: Secondary | ICD-10-CM

## 2021-08-21 DIAGNOSIS — M25561 Pain in right knee: Secondary | ICD-10-CM

## 2021-08-21 MED ORDER — METHYLPREDNISOLONE ACETATE 40 MG/ML IJ SUSP
40.0000 mg | Freq: Once | INTRAMUSCULAR | Status: AC
  Administered 2021-08-21: 40 mg via INTRA_ARTICULAR

## 2021-08-21 NOTE — Addendum Note (Signed)
Addended byCaffie Damme on: 08/21/2021 05:01 PM   Modules accepted: Orders

## 2021-08-21 NOTE — Progress Notes (Signed)
Chief Complaint  Patient presents with   Knee Pain    Bilateral knee pain Request injection in both knees NDC [phone] LOT [id number] EXP 09/2022   Procedure note for bilateral knee injections  Procedure note left knee injection verbal consent was obtained to inject left knee joint  Timeout was completed to confirm the site of injection  The medications used were 40 mg depomedrol and 3 cc of 1% lidocaine  Anesthesia was provided by ethyl chloride and the skin was prepped with alcohol.  After cleaning the skin with alcohol a 20-gauge needle was used to inject the left knee joint. There were no complications. A sterile bandage was applied.   Procedure note right knee injection verbal consent was obtained to inject right knee joint  Timeout was completed to confirm the site of injection  The medications used were 40 mg depomedrol and 3 cc of 1% lidocaine  Anesthesia was provided by ethyl chloride and the skin was prepped with alcohol.  After cleaning the skin with alcohol a 20-gauge needle was used to inject the right knee joint. There were no complications. A sterile bandage was applied.

## 2021-08-21 NOTE — Patient Instructions (Signed)

## 2021-08-22 ENCOUNTER — Ambulatory Visit (INDEPENDENT_AMBULATORY_CARE_PROVIDER_SITE_OTHER): Payer: Medicare Other | Admitting: Nurse Practitioner

## 2021-08-22 ENCOUNTER — Encounter: Payer: Self-pay | Admitting: Nurse Practitioner

## 2021-08-22 VITALS — BP 139/71 | HR 88 | Ht 65.0 in | Wt 206.0 lb

## 2021-08-22 DIAGNOSIS — E1165 Type 2 diabetes mellitus with hyperglycemia: Secondary | ICD-10-CM

## 2021-08-22 DIAGNOSIS — I10 Essential (primary) hypertension: Secondary | ICD-10-CM

## 2021-08-22 DIAGNOSIS — Z139 Encounter for screening, unspecified: Secondary | ICD-10-CM | POA: Diagnosis not present

## 2021-08-22 DIAGNOSIS — H6121 Impacted cerumen, right ear: Secondary | ICD-10-CM | POA: Diagnosis not present

## 2021-08-22 DIAGNOSIS — J309 Allergic rhinitis, unspecified: Secondary | ICD-10-CM | POA: Diagnosis not present

## 2021-08-22 DIAGNOSIS — E782 Mixed hyperlipidemia: Secondary | ICD-10-CM

## 2021-08-22 DIAGNOSIS — H6120 Impacted cerumen, unspecified ear: Secondary | ICD-10-CM | POA: Insufficient documentation

## 2021-08-22 MED ORDER — MONTELUKAST SODIUM 10 MG PO TABS: 10.0000 mg | ORAL_TABLET | Freq: Every day | ORAL | 3 refills | Status: DC

## 2021-08-22 MED ORDER — DEBROX 6.5 % OT SOLN: 5.0000 [drp] | Freq: Two times a day (BID) | OTIC | 0 refills | Status: DC

## 2021-08-22 NOTE — Assessment & Plan Note (Signed)
Has not been using Flonase nasal spray because it burned her nose. Start singular 10 mg daily

## 2021-08-22 NOTE — Progress Notes (Addendum)
   Anna Robbins     MRN: [id number]      DOB: 09-03-1932   HPI [name] with past medical history of essential hypertension, allergic rhinitis, type 2 diabetes, CKD stage III, hyperlipidemia is here for follow up for hypertension . She reports doing well since starting farxiag 5mg  daily, she denies any adverse reactions to faxiga. States that her dizziness also is much better since she cut down on amlodipine . She is accompanied to today's visit by her inlaw   Has chronic post nasal drainage ,dry cough, sneezing , stuffy nose , Flonase nasal spray burns her nostril so she stopped using it. She has been having trouble hearing and feels like she has fluids in her ears.   Due for shingles vaccine, Tdap vaccine, pneumonia vaccine.  Patient stated that they are working on getting all daily vaccines    ROS Denies recent fever or chills. Denies chest congestion, productive cough or wheezing. Denies chest pains, palpitations and leg swelling Denies abdominal pain, nausea, vomiting,diarrhea or constipation.   Denies dysuria, frequency, hesitancy or incontinence. Denies headaches, seizures, numbness, or tingling. Denies depression, anxiety or insomnia. Denies skin break down or rash.   PE  BP 139/71 (BP Location: Left Arm, Cuff Size: Large)   Pulse 88   Ht 5\' 5"  (1.651 m)   Wt 206 lb (93.4 kg)   SpO2 92%   BMI 34.28 kg/m   Patient alert and oriented and in no cardiopulmonary distress.  HEENT: No facial asymmetry, EOMI,     Neck supple . Bilateral ear wax impaction noted, worse on the right ear, no redness or drainage noted   Chest: Clear to auscultation bilaterally.  CVS: S1, S2 no murmurs, no S3.Regular rate.  ABD: Soft non tender.   Ext: No edema  MS: decreased ROM spine, shoulders, hips and knees, using a walker   Skin: Intact, no ulcerations or rash noted.  Psych: Good eye contact, normal affect. Memory intact not anxious or depressed appearing.   Assessment &  Plan Essential hypertension BP Readings from Last 3 Encounters:  08/22/21 139/71  07/24/21 120/76  04/18/21 (!) 138/56  Currently on amlodipine 5 mg daily On Farxiga 5 mg daily for CKD and diabetes, Systolic blood pressure slightly elevated but due to her age a previous complaint of  dizziness no changes to medication today DASH diet advised engage in regular daily exercises Follow-up in 4 months  Type 2 diabetes mellitus with hyperglycemia, without long-term current use of insulin (HCC) Lab Results  Component Value Date   HGBA1C 6.9 (H) 07/24/2021  doing Well on Farxiga 5 mg daily, glipizide 5 mg twice daily, metformin 1000 mg daily Current medications Avoid sugar sweets soda We will check A1c in 4 months  Impacted ear wax Rx Debrox earwax removal solution apply 5 drops twice daily as needed into both ears  Allergic rhinitis Has not been using Flonase nasal spray because it burned her nose. Start singular 10 mg daily

## 2021-08-22 NOTE — Assessment & Plan Note (Addendum)
BP Readings from Last 3 Encounters:  08/22/21 139/71  07/24/21 120/76  04/18/21 (!) 138/56  Currently on amlodipine 5 mg daily On Farxiga 5 mg daily for CKD and diabetes, Systolic blood pressure slightly elevated but due to her age a previous complaint of  dizziness no changes to medication today DASH diet advised engage in regular daily exercises Follow-up in 4 months

## 2021-08-22 NOTE — Assessment & Plan Note (Addendum)
Lab Results  Component Value Date   HGBA1C 6.9 (H) 07/24/2021  doing Well on Farxiga 5 mg daily, glipizide 5 mg twice daily, metformin 1000 mg daily Current medications Avoid sugar sweets soda We will check A1c in 4 months

## 2021-08-22 NOTE — Assessment & Plan Note (Signed)
Rx Debrox earwax removal solution apply 5 drops twice daily as needed into both ears

## 2021-08-22 NOTE — Patient Instructions (Addendum)
Please take Singulair 10mg  daily for your allergies .  Use debrox ear wax removal as needed for your ear wax impaction     It is important that you exercise regularly at least 30 minutes 5 times a weekly as tolerated  Think about what you will eat, plan ahead. Choose " clean, green, fresh or frozen" over canned, processed or packaged foods which are more sugary, salty and fatty. 70 to 75% of food eaten should be vegetables and fruit. Three meals at set times with snacks allowed between meals, but they must be fruit or vegetables. Aim to eat over a 12 hour period , example 7 am to 7 pm, and STOP after  your last meal of the day. Drink water,generally about 64 ounces per day, no other drink is as healthy. Fruit juice is best enjoyed in a healthy way, by EATING the fruit.  Thanks for choosing [facility], we consider it a privelige to serve you.

## 2021-08-29 ENCOUNTER — Other Ambulatory Visit: Payer: Self-pay | Admitting: Nurse Practitioner

## 2021-08-29 DIAGNOSIS — N1831 Chronic kidney disease, stage 3a: Secondary | ICD-10-CM

## 2021-08-29 DIAGNOSIS — E1165 Type 2 diabetes mellitus with hyperglycemia: Secondary | ICD-10-CM

## 2021-09-22 NOTE — Progress Notes (Unsigned)
[facility] [address] [place]. [place] [address] [phone]       OFFICE FOLLOW UP VISIT NOTE  [name] Date of Birth:  1932/09/27 Medical Record Number:  [id number]   Referring MD:  Tereasa Coop, FNP  Reason for Referral:   stroke  No chief complaint on file.     HPI:   Update 09/23/2021 JM: Patient returns for 86-month follow-up.  Overall stable from stroke/seizure standpoint without any new stroke/TIA symptoms or seizure activity.  Remains on Keppra 500 mg twice daily, denies side effects.  Remains on aspirin and simvastatin.  Blood pressure well controlled.       History provided for reference purposes only Update 03/25/2021 JM: 88 year old female with history of hemorrhagic stroke in 10/2018 with symptomatic seizures and multiple other health issues.  Returns today for follow-up visit accompanied by her friend, Anna Robbins. Multiple concerns at todays visit.   Largest complaint is in regards to gradual memory decline. Per chart review, PCP made mention of memory loss concerns over the past year from family at initial visit in 12/2019. More present since stroke in 2020 and unsure if some memory issues prior to stroke. Daughter does medications (per patient, unable to hold pills to take but this is not correct per her friend), sons manages bill paying, does need assistance with cleaning. Able to maintain ADLs independently. Per friend, occasional confusion, mixing up peoples name, forgetting dates. MMSE today 26/30 (missed 3 points for attention/calculation and stated currently in [facility] which is where she resides but not currently located)  Reports episode of dizziness this morning - feels like this could have been from the cortisone injection in her knee which was done on 2/23. Does have chronic dizziness intermittently. Did not check blood pressure or glucose levels this morning. Resolved after eating.   Mentions left sided head pain -  reports onset 2 weeks ago after she hit her head on her grandsons car door although friend clarifies this occurred back in December. She fixates on an area on the left side of her head which is slightly indented but also similar on right side. Has been gradually improving. Denies changes in vision or any other associated symptoms since hitting her head.   EMG/NCV confirmed right carpal tunnel syndrome with current use of conservative measures with use of wrist splint and gabapentin which she is tolerating without side effects. Reports improvement of numbness and pain - still has some mild numbness in in finger tips. Able to do more with right hand.   Overall stable from stroke standpoint without new stroke/TIA symptoms.  Compliant on aspirin 81 mg daily and simvastatin 10 mg daily without side effects.  Blood pressure today 137/64.  Remains on Keppra 750 mg twice daily for seizure prophylaxis -denies any seizure activity. Friend does ask if this medication is still needed. Does note running out of keppra previously (abruptly stopped) with worsening of RLS but no mention of seizures (although per chart review, concerns of seizures after abruptly stopping per patient report).     Update 09/16/2020 Dr. Pearlean Brownie; she returns for follow-up after last visit on 02/20/2020.  She missed her follow-up appointment in between due to excess exposure to COVID and trouble making earlier follow-up visits.  She is accompanied by her friend Anna Robbins today.  Patient continues to have some diminished strength in the right hand with trouble with fine motor skills and often drops objects.  This and difficulties seem to be more prominent in the  night when she has paresthesias as well.  She states she is tried wearing carpal tunnel splints in the past but not recently and it did not help her.  She did undergo MRI scan cervical spine on 03/06/2020 which showed minor disc bulges but no definite cord compression or significant due to foraminal  encroachment.  MRI scan of the brain was also done which showed old right frontal parasagittal hemorrhagic infarct with remote age blood products.  She denies any new symptoms of stroke or TIAs.  She remains on aspirin she is tolerating well without bruising or bleeding.  His blood pressure is usually well controlled today in office it is 147/77.  She has no other new complaints.  She does have restless leg syndrome and takes gabapentin 300 mg at night.  She has never tried a higher dose  Initial visit 02/20/2020 Dr. Leonie Man: Anna Robbins is a 88 year old pleasant Caucasian lady seen today for initial office consultation visit.  She is accompanied by a friend Building services engineer.  History is obtained from them, review of referral notes and was unable to personally review imaging films or reports in PACS.  She has past medical history of diabetes, hypertension, hyperlipidemia and pulmonary emboli.  She was on long-term anticoagulation but is unable to give me details.  She developed what appears to be hemorrhagic stroke in October 2020 while in [place].  She was admitted to [facility] in [place].  Unfortunately I do not have actual records from there for review today she states she had some right-sided weakness and diminished fine motor skills and was off balance and barely able to walk.  She was transferred to inpatient rehab facility for 5 weeks as she gradually got better.  This stroke happened a few weeks after her son died suddenly.  Patient was apparently on anticoagulation for pulmonary embolism which was stopped after this episode of hemorrhagic stroke.  She patient was subsequently moved to [place] to be close to her other son.  She is currently moving with her son and his in-laws as she is no longer able to live alone.  She also had apparently seizures following her hemorrhagic stroke and was started on Keppra.  She was supposed to be on it only for short time and when she tried to wean herself off  it she had tremulousness and jerking on the right side and Keppra was resumed thinking the episodes were seizures.  Patient with has also been complaining of numbness and tingling in the right hand particularly the left fingers for quite some time.  She feels this may have gotten worse after her stroke.  She does complain of stiffness in the neck and restriction of neck movements to the right.  She has never been evaluated with MRI of the neck to see if she has radiculopathy or spinal stenosis.     ROS:   14 system review of systems is positive for multiple concerns as listed above and all other systems negative PMH:  Past Medical History:  Diagnosis Date   Asthma    Diabetes mellitus without complication ([place])    Kidney stones 07/2017   required sx   Pulmonary emboli ([place]) 06/2017   Seizures ([place])    Stroke [facility])     Social History:  Social History   Socioeconomic History   Marital status: Widowed    Spouse name: Not on file   Number of children: Not on file   Years of education: Not on file  Highest education level: Not on file  Occupational History   Not on file  Tobacco Use   Smoking status: Never   Smokeless tobacco: Never  Vaping Use   Vaping Use: Never used  Substance and Sexual Activity   Alcohol use: Not Currently   Drug use: Not Currently   Sexual activity: Not Currently  Other Topics Concern   Not on file  Social History Narrative   Lives with daughter and son in law and their 2 kids, also son in [place] mother.   Left Handed   Driinks 1-2 cups caffeine weekly   Social Determinants of Health   Financial Resource Strain: Low Risk  (05/22/2020)   Overall Financial Resource Strain (CARDIA)    Difficulty of Paying Living Expenses: Not hard at all  Food Insecurity: No Food Insecurity (05/22/2020)   Hunger Vital Sign    Worried About Running Out of Food in the Last Year: Never true    Ran Out of Food in the Last Year: Never true  Transportation Needs: No  Transportation Needs (05/22/2020)   PRAPARE - Hydrologist (Medical): No    Lack of Transportation (Non-Medical): No  Physical Activity: Inactive (05/22/2020)   Exercise Vital Sign    Days of Exercise per Week: 0 days    Minutes of Exercise per Session: 0 min  Stress: No Stress Concern Present (05/22/2020)   [place]    Feeling of Stress : Only a little  Social Connections: Moderately Isolated (05/22/2020)   Social Connection and Isolation Panel [NHANES]    Frequency of Communication with Friends and Family: More than three times a week    Frequency of Social Gatherings with Friends and Family: More than three times a week    Attends Religious Services: More than 4 times per year    Active Member of [employer] or Organizations: No    Attends Archivist Meetings: Never    Marital Status: Widowed  Intimate Partner Violence: Not At Risk (05/22/2020)   Humiliation, Afraid, Rape, and Kick questionnaire    Fear of Current or Ex-Partner: No    Emotionally Abused: No    Physically Abused: No    Sexually Abused: No    Medications:   Current Outpatient Medications on File Prior to Visit  Medication Sig Dispense Refill   acetaminophen (TYLENOL) 500 MG tablet Take 500-1,000 mg by mouth every 6 (six) hours as needed.     albuterol (VENTOLIN HFA) 108 (90 Base) MCG/ACT inhaler Inhale into the lungs every 6 (six) hours as needed for wheezing or shortness of breath.     amLODipine (NORVASC) 5 MG tablet Take 1 tablet (5 mg total) by mouth daily. 90 tablet 1   aspirin EC 81 MG tablet Take 1 tablet (81 mg total) by mouth daily. Swallow whole. 30 tablet 11   carbamide peroxide (DEBROX) 6.5 % OTIC solution Place 5 drops into both ears 2 (two) times daily. 15 mL 0   DULoxetine (CYMBALTA) 20 MG capsule TAKE 1 CAPSULE(20 MG) BY MOUTH DAILY 90 capsule 1   famotidine (PEPCID) 20 MG tablet One after supper 30  tablet 11   FARXIGA 5 MG TABS tablet TAKE 1 TABLET(5 MG) BY MOUTH DAILY BEFORE BREAKFAST 30 tablet 1   fluticasone (FLONASE) 50 MCG/ACT nasal spray Place 2 sprays into both nostrils daily. (Patient not taking: Reported on 08/22/2021) 16 g 6   gabapentin (NEURONTIN) 300 MG capsule Take  2 capsules (600 mg total) by mouth at bedtime. 180 capsule 1   glipiZIDE (GLUCOTROL) 5 MG tablet TAKE 1 TABLET(5 MG) BY MOUTH TWICE DAILY 60 tablet 2   hydrocortisone (ANUSOL-HC) 25 MG suppository Place 1 suppository (25 mg total) rectally 2 (two) times daily. 12 suppository 0   Ketoconazole-Hydrocortisone 2-2.5 % CREA Apply 1 application topically daily. 30 g 0   levETIRAcetam (KEPPRA) 500 MG tablet Take 1 tablet (500 mg total) by mouth 2 (two) times daily. 60 tablet 5   metFORMIN (GLUCOPHAGE) 1000 MG tablet TAKE 1 TABLET(1000 MG) BY MOUTH TWICE DAILY WITH A MEAL 180 tablet 1   montelukast (SINGULAIR) 10 MG tablet Take 1 tablet (10 mg total) by mouth at bedtime. 30 tablet 3   nystatin (MYCOSTATIN/NYSTOP) powder Apply 1 application topically 3 (three) times daily. 45 g 1   pantoprazole (PROTONIX) 40 MG tablet TAKE 1 TABLET(40 MG) BY MOUTH DAILY 30 TO 60 MINUTES BEFORE FIRST MEAL OF THE DAY 90 tablet 1   simvastatin (ZOCOR) 10 MG tablet Take 1 tablet (10 mg total) by mouth at bedtime. 90 tablet 3   Vibegron (GEMTESA) 75 MG TABS Take 1 tablet by mouth daily. 30 tablet 2   No current facility-administered medications on file prior to visit.    Allergies:   Allergies  Allergen Reactions   Asa [Aspirin]    Diphenhydramine Hcl    Lisinopril     cough   Melatonin    Molds & Smuts    Nsaids    Other    Penicillin G    Penicillins    Tramadol     Physical Exam There were no vitals filed for this visit.  There is no height or weight on file to calculate BMI.   General: well developed, well nourished elderly Caucasian lady, seated, in no evident distress Head: head normocephalic and atraumatic.   Neck:  supple with no carotid or supraclavicular bruits Cardiovascular: regular rate and rhythm, no murmurs Musculoskeletal: Mild kyphoscoliosis Skin:  no rash/petichiae Vascular:  Normal pulses all extremities  Neurologic Exam Mental Status: Awake and fully alert. Oriented to place and time. Recent memory impaired and remote memory intact. Attention span, concentration and fund of knowledge mostly appropriate during visit. Mood and affect appropriate.     03/25/2021    1:45 PM  MMSE - Mini Mental State Exam  Orientation to time 5  Orientation to Place 4  Registration 3  Attention/ Calculation 2  Recall 3  Language- name 2 objects 2  Language- repeat 1  Language- follow 3 step command 3  Language- read & follow direction 1  Write a sentence 1  Copy design 1  Total score 26   Cranial Nerves: Pupils equal, briskly reactive to light. Extraocular movements full without nystagmus. Visual fields full to confrontation. Hearing diminished bilaterally. Facial sensation intact. Face, tongue, palate moves normally and symmetrically.  Motor: Normal bulk and tone. Normal strength in all tested extremity muscles.  Mild weakness of intrinsic hand muscles in the right hand particularly over the ulnar fingers Sensory.: intact to touch , pinprick , position and vibratory sensation.  Except slight hyperesthesia over the tips of the ulnar mediated fingers in the right hand.   Coordination: Rapid alternating movements normal in all extremities. Finger-to-nose and heel-to-shin performed accurately bilaterally. Gait and Station: Arises from chair with difficulty. Stance is stooped.. Uses a Rollator. Tandem walk and heel toe not attempted Reflexes: 1+ and symmetric. Toes downgoing.  ASSESSMENT/PLAN: 88 year old Caucasian lady with hemorrhagic stroke in October 2020 possibly hypertensive versus anticoagulation related  followed by symptomatic seizures.  Vascular risk factors of diabetes, hypertension,  hyperlipidemia.  Chronic right hand paresthesias possibly from carpal tunnel as well as multiple other issues as below    Hx of ICH with symptomatic seizures -Continue aspirin 81 mg daily for secondary stroke prevention measures -as EEG unremarkable and no unremarkable and no recent seizures, will decrease keppra dose to 500mg  twice daily. She does not drive and constantly has supervision of family.  Discussed monitoring for seizure activity and to call with any concerns.  -Continue close PCP follow-up for aggressive stroke risk factor management and monitoring  Memory loss -Family notes gradual decline more since since her stroke in 10/2018 -MMSE today 26/30 -would speak off topic during visit and some inaccuracies reported  - suspect mild cognitive impairment from prior stroke and age related. Also had hx of sleep apnea not treated which could be contributing.  -EEG 02/2020 unremarkable - MR brain 02/2020 no worrisome findings, old stroke noted  Carpal tunnel syndrome -Confirmed on NCV/EMG 10/2020 -Continue wrist splints and gabapentin 600 mg nightly  Left sided head pain -normal exam with gradual improvement. Continue to monitor   Dizziness -chronic issue -episode this AM resolved after eating - advised to ensure she checks blood sugar and blood pressure with these symptoms. Ensure adequate water intake. Can continue to be monitored by PCP    Follow up in 6 months or call earlier if needed     CC:  [facility] provider: Dr. 11/2020, Carlene Coria, FNP   I spent >60 minutes of face-to-face and non-face-to-face time with patient and friend.  This included previsit chart review, lab review, study review, order entry, electronic health record documentation, patient and friend education and discussion regarding history of prior stroke, secondary stroke prevention measures and aggressive stroke risk factor management, other multiple concerns as noted above with extensive conversation and  answered all the questions to patient and friends satisfaction  Baird Kay, AGNP-BC  [facility] [address] [place], [place]  Phone [phone] Fax [phone] Note: This document was prepared with digital dictation and possible smart phrase technology. Any transcriptional errors that result from this process are unintentional.

## 2021-09-23 ENCOUNTER — Encounter: Payer: Self-pay | Admitting: Adult Health

## 2021-09-23 ENCOUNTER — Ambulatory Visit: Payer: Medicare Other | Admitting: Adult Health

## 2021-09-23 VITALS — BP 146/58 | HR 84 | Ht 65.0 in | Wt 202.2 lb

## 2021-09-23 DIAGNOSIS — G5601 Carpal tunnel syndrome, right upper limb: Secondary | ICD-10-CM

## 2021-09-23 DIAGNOSIS — R42 Dizziness and giddiness: Secondary | ICD-10-CM

## 2021-09-23 DIAGNOSIS — M79674 Pain in right toe(s): Secondary | ICD-10-CM | POA: Diagnosis not present

## 2021-09-23 DIAGNOSIS — I619 Nontraumatic intracerebral hemorrhage, unspecified: Secondary | ICD-10-CM

## 2021-09-23 DIAGNOSIS — G3184 Mild cognitive impairment, so stated: Secondary | ICD-10-CM | POA: Diagnosis not present

## 2021-09-23 DIAGNOSIS — G40219 Localization-related (focal) (partial) symptomatic epilepsy and epileptic syndromes with complex partial seizures, intractable, without status epilepticus: Secondary | ICD-10-CM | POA: Diagnosis not present

## 2021-09-23 MED ORDER — LEVETIRACETAM 500 MG PO TABS: 500.0000 mg | ORAL_TABLET | Freq: Two times a day (BID) | ORAL | 3 refills | Status: AC

## 2021-09-23 NOTE — Patient Instructions (Signed)
Continue keppra 500mg  twice daily  Please follow with PCP regarding - insomnia and anxiety concerns -foot pain concerns - may benefit from seeing a foot doctor (podiatrist)  - continued dizziness and possible benefit from seeing ENT for further revaluation -carpal tunnel syndrome and evaluation by orthopedics/hand specialist    Continue aspirin 81 mg daily  and simvastatin  for secondary stroke prevention  Continue to follow up with PCP/endocrinology regarding cholesterol, blood pressure and diabetes management  Maintain strict control of hypertension with blood pressure goal below 130/90, diabetes with hemoglobin A1c goal below 7.0 % and cholesterol with LDL cholesterol (bad cholesterol) goal below 70 mg/dL.   Signs of a Stroke? Follow the BEFAST method:  Balance Watch for a sudden loss of balance, trouble with coordination or vertigo Eyes Is there a sudden loss of vision in one or both eyes? Or double vision?  Face: Ask the person to smile. Does one side of the face droop or is it numb?  Arms: Ask the person to raise both arms. Does one arm drift downward? Is there weakness or numbness of a leg? Speech: Ask the person to repeat a simple phrase. Does the speech sound slurred/strange? Is the person confused ? Time: If you observe any of these signs, call 911.    Followup in the future with me in 1 year or call earlier if needed       Thank you for coming to see at [facility]. I hope we have been able to provide you high quality care today.  You may receive a patient satisfaction survey over the next few weeks. We would appreciate your feedback and comments so that we may continue to improve ourselves and the health of our patients.

## 2021-10-27 ENCOUNTER — Other Ambulatory Visit: Payer: Self-pay | Admitting: Internal Medicine

## 2021-10-27 DIAGNOSIS — E1165 Type 2 diabetes mellitus with hyperglycemia: Secondary | ICD-10-CM

## 2021-10-28 ENCOUNTER — Encounter: Payer: Medicare Other | Admitting: Internal Medicine

## 2021-10-28 NOTE — Progress Notes (Signed)
This encounter was created in error - please disregard.

## 2021-10-28 NOTE — Progress Notes (Deleted)
Subjective:  I connected with  Anna Robbins on 10/28/21 by a video enabled telemedicine application and verified that I am speaking with the correct person using two identifiers.   I discussed the limitations of evaluation and management by telemedicine. The patient expressed understanding and agreed to proceed.    Anna Robbins is a 88 y.o. female who presents for Medicare Annual (Subsequent) preventive examination.  Review of Systems    ***       Objective:    There were no vitals filed for this visit. There is no height or weight on file to calculate BMI.     05/22/2020    2:11 PM  Advanced Directives  Does Patient Have a Medical Advance Directive? Yes  Type of Paramedic of [place];Living will  Does patient want to make changes to medical advance directive? No - Patient declined  Copy of [place] in Chart? Yes - validated most recent copy scanned in chart (See row information)    Current Medications (verified) Outpatient Encounter Medications as of 10/28/2021  Medication Sig   acetaminophen (TYLENOL) 500 MG tablet Take 500-1,000 mg by mouth every 6 (six) hours as needed.   albuterol (VENTOLIN HFA) 108 (90 Base) MCG/ACT inhaler Inhale into the lungs every 6 (six) hours as needed for wheezing or shortness of breath.   amLODipine (NORVASC) 5 MG tablet Take 1 tablet (5 mg total) by mouth daily.   aspirin EC 81 MG tablet Take 1 tablet (81 mg total) by mouth daily. Swallow whole.   carbamide peroxide (DEBROX) 6.5 % OTIC solution Place 5 drops into both ears 2 (two) times daily.   DULoxetine (CYMBALTA) 20 MG capsule TAKE 1 CAPSULE(20 MG) BY MOUTH DAILY   famotidine (PEPCID) 20 MG tablet One after supper   FARXIGA 5 MG TABS tablet TAKE 1 TABLET(5 MG) BY MOUTH DAILY BEFORE BREAKFAST   fluticasone (FLONASE) 50 MCG/ACT nasal spray Place 2 sprays into both nostrils daily.   gabapentin (NEURONTIN) 300 MG capsule Take 2 capsules (600  mg total) by mouth at bedtime.   glipiZIDE (GLUCOTROL) 5 MG tablet TAKE 1 TABLET(5 MG) BY MOUTH TWICE DAILY   hydrocortisone (ANUSOL-HC) 25 MG suppository Place 1 suppository (25 mg total) rectally 2 (two) times daily.   Ketoconazole-Hydrocortisone 2-2.5 % CREA Apply 1 application topically daily.   levETIRAcetam (KEPPRA) 500 MG tablet Take 1 tablet (500 mg total) by mouth 2 (two) times daily.   metFORMIN (GLUCOPHAGE) 1000 MG tablet TAKE 1 TABLET(1000 MG) BY MOUTH TWICE DAILY WITH A MEAL   montelukast (SINGULAIR) 10 MG tablet Take 1 tablet (10 mg total) by mouth at bedtime.   nystatin (MYCOSTATIN/NYSTOP) powder Apply 1 application topically 3 (three) times daily.   pantoprazole (PROTONIX) 40 MG tablet TAKE 1 TABLET(40 MG) BY MOUTH DAILY 30 TO 60 MINUTES BEFORE FIRST MEAL OF THE DAY   simvastatin (ZOCOR) 10 MG tablet Take 1 tablet (10 mg total) by mouth at bedtime.   Vibegron (GEMTESA) 75 MG TABS Take 1 tablet by mouth daily.   No facility-administered encounter medications on file as of 10/28/2021.    Allergies (verified) Asa [aspirin], Diphenhydramine hcl, Lisinopril, Melatonin, Molds & smuts, Nsaids, Other, Penicillin g, Penicillins, and Tramadol   History: Past Medical History:  Diagnosis Date   Asthma    Diabetes mellitus without complication ([place])    Kidney stones 07/2017   required sx   Pulmonary emboli ([place]) 06/2017   Seizures ([place])    Stroke ([place])  Past Surgical History:  Procedure Laterality Date   ABDOMINAL HYSTERECTOMY     APPENDECTOMY     thumb surgery     TONSILLECTOMY     No family history on file. Social History   Socioeconomic History   Marital status: Widowed    Spouse name: Not on file   Number of children: Not on file   Years of education: Not on file   Highest education level: Not on file  Occupational History   Not on file  Tobacco Use   Smoking status: Never   Smokeless tobacco: Never  Vaping Use   Vaping Use: Never used  Substance and Sexual  Activity   Alcohol use: Not Currently   Drug use: Not Currently   Sexual activity: Not Currently  Other Topics Concern   Not on file  Social History Narrative   Lives with daughter and son in law and their 2 kids, also son in [place] mother.   Left Handed   Driinks 1-2 cups caffeine weekly   Social Determinants of Health   Financial Resource Strain: Low Risk  (05/22/2020)   Overall Financial Resource Strain (CARDIA)    Difficulty of Paying Living Expenses: Not hard at all  Food Insecurity: No Food Insecurity (05/22/2020)   Hunger Vital Sign    Worried About Running Out of Food in the Last Year: Never true    Ran Out of Food in the Last Year: Never true  Transportation Needs: No Transportation Needs (05/22/2020)   PRAPARE - Hydrologist (Medical): No    Lack of Transportation (Non-Medical): No  Physical Activity: Inactive (05/22/2020)   Exercise Vital Sign    Days of Exercise per Week: 0 days    Minutes of Exercise per Session: 0 min  Stress: No Stress Concern Present (05/22/2020)   [place]    Feeling of Stress : Only a little  Social Connections: Moderately Isolated (05/22/2020)   Social Connection and Isolation Panel [NHANES]    Frequency of Communication with Friends and Family: More than three times a week    Frequency of Social Gatherings with Friends and Family: More than three times a week    Attends Religious Services: More than 4 times per year    Active Member of [employer] or Organizations: No    Attends Archivist Meetings: Never    Marital Status: Widowed    Tobacco Counseling Counseling given: Not Answered   Clinical Intake:                 Diabetic?***         Activities of Daily Living     No data to display          Patient Care Team: Johnette Abraham, MD as PCP - General (Internal Medicine)  Indicate any recent Medical Services you  may have received from other than [facility] providers in the past year (date may be approximate).     Assessment:   This is a routine wellness examination for [place].  Hearing/Vision screen No results found.  Dietary issues and exercise activities discussed:     Goals Addressed   None    Depression Screen    08/22/2021   11:46 AM 07/24/2021    1:58 PM 04/18/2021    1:46 PM 03/04/2021    2:49 PM 10/25/2020   11:26 AM 06/25/2020   11:04 AM 05/22/2020    2:16 PM  PHQ 2/9 Scores  PHQ - 2 Score 1 0 1 3 0 0 2  PHQ- 9 Score   4 8   2     Fall Risk    08/22/2021   11:46 AM 07/24/2021    1:57 PM 04/18/2021    1:46 PM 03/04/2021    2:49 PM 10/25/2020   11:25 AM  Fall Risk   Falls in the past year? 0 0 0 0 0  Number falls in past yr: 0 0 0 0 0  Injury with Fall? 0 0 0 0 0  Risk for fall due to : No Fall Risks No Fall Risks No Fall Risks No Fall Risks No Fall Risks  Follow up Falls evaluation completed Falls evaluation completed Falls evaluation completed Falls evaluation completed Falls evaluation completed    FALL RISK PREVENTION PERTAINING TO THE HOME:  Any stairs in or around the home? {YES/NO:21197} If so, are there any without handrails? {YES/NO:21197} Home free of loose throw rugs in walkways, pet beds, electrical cords, etc? {YES/NO:21197} Adequate lighting in your home to reduce risk of falls? {YES/NO:21197}  ASSISTIVE DEVICES UTILIZED TO PREVENT FALLS:  Life alert? {YES/NO:21197} Use of a cane, walker or w/c? {YES/NO:21197} Grab bars in the bathroom? {YES/NO:21197} Shower chair or bench in shower? {YES/NO:21197} Elevated toilet seat or a handicapped toilet? {YES/NO:21197}  TIMED UP AND GO:  Was the test performed? {YES/NO:21197}.  Length of time to ambulate 10 feet: *** sec.   {Appearance of MVEH:2094709}  Cognitive Function:    03/25/2021    1:45 PM  MMSE - Mini Mental State Exam  Orientation to time 5  Orientation to Place 4  Registration 3  Attention/  Calculation 2  Recall 3  Language- name 2 objects 2  Language- repeat 1  Language- follow 3 step command 3  Language- read & follow direction 1  Write a sentence 1  Copy design 1  Total score 26        Immunizations Immunization History  Administered Date(s) Administered   Influenza,inj,Quad PF,6+ Mos 11/11/2020   Moderna SARS-COV2 Booster Vaccination 12/18/2019   Moderna Sars-Covid-2 Vaccination 03/22/2019, 04/19/2019    {TDAP status:2101805}  {Flu Vaccine status:2101806}  {Pneumococcal vaccine status:2101807}  {Covid-19 vaccine status:2101808}  Qualifies for Shingles Vaccine? {YES/NO:21197}  Zostavax completed {YES/NO:21197}  {Shingrix Completed?:2101804}  Screening Tests Health Maintenance  Topic Date Due   TETANUS/TDAP  Never done   Zoster Vaccines- Shingrix (1 of 2) Never done   Pneumonia Vaccine 79+ Years old (1 - PCV) Never done   COVID-19 Vaccine (3 - Moderna series) 02/12/2020   INFLUENZA VACCINE  08/26/2021   HEMOGLOBIN A1C  01/23/2022   FOOT EXAM  03/04/2022   OPHTHALMOLOGY EXAM  03/25/2022   HPV VACCINES  Aged Out   DEXA SCAN  Discontinued    Health Maintenance  Health Maintenance Due  Topic Date Due   TETANUS/TDAP  Never done   Zoster Vaccines- Shingrix (1 of 2) Never done   Pneumonia Vaccine 21+ Years old (1 - PCV) Never done   COVID-19 Vaccine (3 - Moderna series) 02/12/2020   INFLUENZA VACCINE  08/26/2021    {Colorectal cancer screening:2101809}  {Mammogram status:21018020}  {Bone Density status:21018021}  Lung Cancer Screening: (Low Dose CT Chest recommended if Age 79-80 years, 30 pack-year currently smoking OR have quit w/in 15years.) {DOES NOT does:27190::"does not"} qualify.   Lung Cancer Screening Referral: ***  Additional Screening:  Hepatitis C Screening: {DOES NOT does:27190::"does not"} qualify; Completed ***  Vision Screening: Recommended  annual ophthalmology exams for early detection of glaucoma and other disorders of  the eye. Is the patient up to date with their annual eye exam?  {YES/NO:21197} Who is the provider or what is the name of the office in which the patient attends annual eye exams? *** If pt is not established with a provider, would they like to be referred to a provider to establish care? {YES/NO:21197}.   Dental Screening: Recommended annual dental exams for proper oral hygiene  Community Resource Referral / Chronic Care Management: CRR required this visit?  {YES/NO:21197}  CCM required this visit?  {YES/NO:21197}     Plan:     I have personally reviewed and noted the following in the patient's chart:   Medical and social history Use of alcohol, tobacco or illicit drugs  Current medications and supplements including opioid prescriptions. {Opioid Prescriptions:[phone]} Functional ability and status Nutritional status Physical activity Advanced directives List of other physicians Hospitalizations, surgeries, and ER visits in previous 12 months Vitals Screenings to include cognitive, depression, and falls Referrals and appointments  In addition, I have reviewed and discussed with patient certain preventive protocols, quality metrics, and best practice recommendations. A written personalized care plan for preventive services as well as general preventive health recommendations were provided to patient.     Lorene Dy, MD   10/28/2021   Nurse Notes: ***

## 2021-11-02 ENCOUNTER — Other Ambulatory Visit: Payer: Self-pay | Admitting: Nurse Practitioner

## 2021-11-02 DIAGNOSIS — E1165 Type 2 diabetes mellitus with hyperglycemia: Secondary | ICD-10-CM

## 2021-11-02 DIAGNOSIS — N1831 Chronic kidney disease, stage 3a: Secondary | ICD-10-CM

## 2021-11-30 ENCOUNTER — Other Ambulatory Visit: Payer: Self-pay | Admitting: Nurse Practitioner

## 2021-11-30 DIAGNOSIS — J309 Allergic rhinitis, unspecified: Secondary | ICD-10-CM

## 2021-11-30 NOTE — Progress Notes (Unsigned)
Cardiology Office Note:    Date:  12/02/2021   ID:  Anna Robbins, DOB [birth year], MRN [id number]  PCP:  Johnette Abraham, MD   [facility] Providers Cardiologist:  Freada Bergeron, MD {   Referring MD: Johnette Abraham, MD     History of Present Illness:    Anna Robbins is a 88 y.o. female with a hx of asthma, DMII, history of PE (2019), seizures, and CVA in 2020 who presents to clinic for follow-up.  Pain was initially seen on 11/2020 for chest pain. Myoview with no ischemia. EF 64%. Low risk.  Today, the patient states she overall feels okay. Had a sharp left sided pain that lasted several hours on Sunday into Monday. 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 Symptoms were not exertional and resolved on their own. Have not recurred since that time. She otherwise feels well. No orthopnea, PND, LE edema, lightheadedness or dizziness.   Past Medical History:  Diagnosis Date   Asthma    Diabetes mellitus without complication ([place])    Kidney stones 07/2017   required sx   Pulmonary emboli ([place]) 06/2017   Seizures ([facility])    Stroke [facility])     Past Surgical History:  Procedure Laterality Date   ABDOMINAL HYSTERECTOMY     APPENDECTOMY     thumb surgery     TONSILLECTOMY      Current Medications: Current Meds  Medication Sig   acetaminophen (TYLENOL) 500 MG tablet Take 500-1,000 mg by mouth every 6 (six) hours as needed.   albuterol (VENTOLIN HFA) 108 (90 Base) MCG/ACT inhaler Inhale into the lungs every 6 (six) hours as needed for wheezing or shortness of breath.   amLODipine (NORVASC) 5 MG tablet Take 1 tablet (5 mg total) by mouth daily.   aspirin EC 81 MG tablet Take 1 tablet (81 mg total) by mouth daily. Swallow whole.   carbamide peroxide (DEBROX) 6.5 % OTIC solution Place 5 drops into both ears 2 (two) times daily.   DULoxetine (CYMBALTA) 20 MG capsule TAKE 1 CAPSULE(20 MG) BY MOUTH  DAILY   famotidine (PEPCID) 20 MG tablet One after supper   FARXIGA 5 MG TABS tablet TAKE 1 TABLET(5 MG) BY MOUTH DAILY BEFORE BREAKFAST   fluticasone (FLONASE) 50 MCG/ACT nasal spray Place 2 sprays into both nostrils daily.   gabapentin (NEURONTIN) 300 MG capsule Take 2 capsules (600 mg total) by mouth at bedtime.   glipiZIDE (GLUCOTROL) 5 MG tablet TAKE 1 TABLET(5 MG) BY MOUTH TWICE DAILY   hydrocortisone (ANUSOL-HC) 25 MG suppository Place 1 suppository (25 mg total) rectally 2 (two) times daily.   Ketoconazole-Hydrocortisone 2-2.5 % CREA Apply 1 application topically daily.   levETIRAcetam (KEPPRA) 500 MG tablet Take 1 tablet (500 mg total) by mouth 2 (two) times daily.   metFORMIN (GLUCOPHAGE) 1000 MG tablet TAKE 1 TABLET(1000 MG) BY MOUTH TWICE DAILY WITH A MEAL   montelukast (SINGULAIR) 10 MG tablet TAKE 1 TABLET(10 MG) BY MOUTH AT BEDTIME   nystatin (MYCOSTATIN/NYSTOP) powder Apply 1 application topically 3 (three) times daily.   pantoprazole (PROTONIX) 40 MG tablet TAKE 1 TABLET(40 MG) BY MOUTH DAILY 30 TO 60 MINUTES BEFORE FIRST MEAL OF THE DAY   simvastatin (ZOCOR) 10 MG tablet Take 1 tablet (10 mg total) by mouth at bedtime.   Vibegron (GEMTESA) 75 MG TABS Take 1 tablet by mouth daily.     Allergies:   Asa [aspirin],  Diphenhydramine hcl, Lisinopril, Melatonin, Molds & smuts, Nsaids, Other, Penicillin g, Penicillins, and Tramadol   Social History   Socioeconomic History   Marital status: Widowed    Spouse name: Not on file   Number of children: Not on file   Years of education: Not on file   Highest education level: Not on file  Occupational History   Not on file  Tobacco Use   Smoking status: Never   Smokeless tobacco: Never  Vaping Use   Vaping Use: Never used  Substance and Sexual Activity   Alcohol use: Not Currently   Drug use: Not Currently   Sexual activity: Not Currently  Other Topics Concern   Not on file  Social History Narrative   Lives with daughter and  son in law and their 2 kids, also son in law's mother.   Left Handed   Driinks 1-2 cups caffeine weekly   Social Determinants of Health   Financial Resource Strain: Low Risk  (05/22/2020)   Overall Financial Resource Strain (CARDIA)    Difficulty of Paying Living Expenses: Not hard at all  Food Insecurity: No Food Insecurity (05/22/2020)   Hunger Vital Sign    Worried About Running Out of Food in the Last Year: Never true    Ran Out of Food in the Last Year: Never true  Transportation Needs: No Transportation Needs (05/22/2020)   PRAPARE - Administrator, Civil Service (Medical): No    Lack of Transportation (Non-Medical): No  Physical Activity: Inactive (05/22/2020)   Exercise Vital Sign    Days of Exercise per Week: 0 days    Minutes of Exercise per Session: 0 min  Stress: No Stress Concern Present (05/22/2020)   [employer] of Occupational Health - Occupational Stress Questionnaire    Feeling of Stress : Only a little  Social Connections: Moderately Isolated (05/22/2020)   Social Connection and Isolation Panel [NHANES]    Frequency of Communication with Friends and Family: More than three times a week    Frequency of Social Gatherings with Friends and Family: More than three times a week    Attends Religious Services: More than 4 times per year    Active Member of [employer] or Organizations: No    Attends Banker Meetings: Never    Marital Status: Widowed     Family History: The patient's family history is not on file.  ROS:   Please see the history of present illness.    Review of Systems  HENT:  Positive for congestion.   Respiratory:  Positive for shortness of breath.   Cardiovascular:  Positive for chest pain. Negative for palpitations, orthopnea, claudication, leg swelling and PND.  Gastrointestinal:  Negative for abdominal pain and melena.  Musculoskeletal:  Positive for joint pain.  Neurological:  Positive for dizziness. Negative for loss of  consciousness.     EKGs/Labs/Other Studies Reviewed:    The following studies were reviewed today: No recent cardiac testing  EKG:  EKG is  ordered today.  The ekg ordered today demonstrates NSR with HR 76  Recent Labs: 12/17/2020: BNP 61.5; TSH 1.920 03/05/2021: ALT 8 05/22/2021: BUN 20; Creatinine, Ser 0.96; Potassium 5.0; Sodium 139 07/24/2021: Hemoglobin 11.2; Platelets 313  Recent Lipid Panel    Component Value Date/Time   CHOL 135 04/18/2021 1439   TRIG 56 04/18/2021 1439   HDL 69 04/18/2021 1439   CHOLHDL 2.0 04/18/2021 1439   LDLCALC 54 04/18/2021 1439  Physical Exam:    VS:  BP 110/67   Pulse 75   Ht 5\' 5"  (1.651 m)   Wt 200 lb (90.7 kg)   SpO2 94%   BMI 33.28 kg/m     Wt Readings from Last 3 Encounters:  12/02/21 200 lb (90.7 kg)  09/23/21 202 lb 4 oz (91.7 kg)  08/22/21 206 lb (93.4 kg)     GEN:  Well nourished, well developed in no acute distress HEENT: Normal NECK: No JVD; No carotid bruits CARDIAC: RRR, no murmurs, rubs, gallops RESPIRATORY:  Clear to auscultation without rales, wheezing or rhonchi  ABDOMEN: Soft, non-tender, non-distended MUSCULOSKELETAL:  Trace edema; No deformity  SKIN: Warm and dry NEUROLOGIC:  Alert and oriented x 3 PSYCHIATRIC:  Normal affect   ASSESSMENT:    1. Chest pain, unspecified type   2. Essential hypertension   3. Hyperlipidemia, unspecified hyperlipidemia type   4. Cerebrovascular accident (CVA), unspecified mechanism (HCC)    PLAN:    In order of problems listed above:  #Chest Pain: Had one episode of atypical chest pain likely MSK from sweeping the floor. No exertional symptoms. Myoview 11/2020 negative for ischemia. EF 64%. Declined echo today but states she will consider it if symptoms recur. -Continue ASA 81mg  daily -Continue simvastatin 10mg  daily -Wants to check back in 3 months to ensure symptoms are not occurring more frequently; declined TTE today  #History of CVA: Occurred in  2020 while in . No significant residual deficits. Per report, TTE normal and no known arrhythmias on monitor. -Continue ASA 81mg  daily -Continue simvastatin 10mg  daily -Follow-up with neuro as scheduled   #HLD: LDL well controlled at 54. -Continue simva 10mg  daily -Management per PCP   #HTN: Blood pressure is well controlled.  -Continue amlodipine 5mg  daily   #DMII: A1C 6.6. -Managed by PCP  #Seizure Disorder: -Management per Neuro     Medication Adjustments/Labs and Tests Ordered: Current medicines are reviewed at length with the patient today.  Concerns regarding medicines are outlined above.  Orders Placed This Encounter  Procedures   EKG 12-Lead   No orders of the defined types were placed in this encounter.   Patient Instructions  Medication Instructions:  Your physician recommends that you continue on your current medications as directed. Please refer to the Current Medication list given to you today.  *If you need a refill on your cardiac medications before your next appointment, please call your pharmacy*   Lab Work: NONE   If you have labs (blood work) drawn today and your tests are completely normal, you will receive your results only by: MyChart Message (if you have MyChart) OR A paper copy in the mail If you have any lab test that is abnormal or we need to change your treatment, we will call you to review the results.   Testing/Procedures: NONE    Follow-Up: At [facility], you and your health needs are our priority.  As part of our continuing mission to provide you with exceptional heart care, we have created designated Provider Care Teams.  These Care Teams include your primary Cardiologist (physician) and Advanced Practice Providers (APPs -  Physician Assistants and Nurse Practitioners) who all work together to provide you with the care you need, when you need it.  We recommend signing up for the patient portal called "MyChart".   Sign up information is provided on this After Visit Summary.  MyChart is used to connect with patients for Virtual Visits (Telemedicine).  Patients are  able to view lab/test results, encounter notes, upcoming appointments, etc.  Non-urgent messages can be sent to your provider as well.   To learn more about what you can do with MyChart, go to [email or web address].    Your next appointment:   3 month(s)  The format for your next appointment:   In Person  Provider:   You may see Meriam Sprague, MD or one of the following Advanced Practice Providers on your designated Care Team:   Randall An, PA-C  Jacolyn Reedy, PA-C     Other Instructions Thank you for choosing [facility]!    Important Information About Sugar         Signed, Meriam Sprague, MD  12/02/2021 4:30 PM    [facility]

## 2021-12-02 ENCOUNTER — Ambulatory Visit: Payer: Medicare Other | Attending: Cardiology | Admitting: Cardiology

## 2021-12-02 ENCOUNTER — Encounter: Payer: Self-pay | Admitting: Cardiology

## 2021-12-02 VITALS — BP 110/67 | HR 75 | Ht 65.0 in | Wt 200.0 lb

## 2021-12-02 DIAGNOSIS — I10 Essential (primary) hypertension: Secondary | ICD-10-CM | POA: Diagnosis not present

## 2021-12-02 DIAGNOSIS — E785 Hyperlipidemia, unspecified: Secondary | ICD-10-CM | POA: Diagnosis not present

## 2021-12-02 DIAGNOSIS — I639 Cerebral infarction, unspecified: Secondary | ICD-10-CM

## 2021-12-02 DIAGNOSIS — R079 Chest pain, unspecified: Secondary | ICD-10-CM | POA: Diagnosis not present

## 2021-12-02 NOTE — Patient Instructions (Signed)
Medication Instructions:  Your physician recommends that you continue on your current medications as directed. Please refer to the Current Medication list given to you today.  *If you need a refill on your cardiac medications before your next appointment, please call your pharmacy*   Lab Work: NONE   If you have labs (blood work) drawn today and your tests are completely normal, you will receive your results only by: MyChart Message (if you have MyChart) OR A paper copy in the mail If you have any lab test that is abnormal or we need to change your treatment, we will call you to review the results.   Testing/Procedures: NONE    Follow-Up: At [facility], you and your health needs are our priority.  As part of our continuing mission to provide you with exceptional heart care, we have created designated Provider Care Teams.  These Care Teams include your primary Cardiologist (physician) and Advanced Practice Providers (APPs -  Physician Assistants and Nurse Practitioners) who all work together to provide you with the care you need, when you need it.  We recommend signing up for the patient portal called "MyChart".  Sign up information is provided on this After Visit Summary.  MyChart is used to connect with patients for Virtual Visits (Telemedicine).  Patients are able to view lab/test results, encounter notes, upcoming appointments, etc.  Non-urgent messages can be sent to your provider as well.   To learn more about what you can do with MyChart, go to [email or web address].    Your next appointment:   3 month(s)  The format for your next appointment:   In Person  Provider:   You may see Heather E Pemberton, MD or one of the following Advanced Practice Providers on your designated Care Team:   Brittany Strader, PA-C  Michele Lenze, PA-C     Other Instructions Thank you for choosing [facility]!    Important Information About Sugar       

## 2021-12-12 ENCOUNTER — Other Ambulatory Visit: Payer: Self-pay | Admitting: Internal Medicine

## 2021-12-12 NOTE — Telephone Encounter (Signed)
Will need a appointment for any future refills

## 2021-12-25 ENCOUNTER — Ambulatory Visit (INDEPENDENT_AMBULATORY_CARE_PROVIDER_SITE_OTHER): Payer: Medicare Other | Admitting: Internal Medicine

## 2021-12-25 ENCOUNTER — Encounter: Payer: Self-pay | Admitting: Internal Medicine

## 2021-12-25 VITALS — BP 138/72 | HR 82 | Ht 66.0 in | Wt 201.6 lb

## 2021-12-25 DIAGNOSIS — M170 Bilateral primary osteoarthritis of knee: Secondary | ICD-10-CM | POA: Diagnosis not present

## 2021-12-25 DIAGNOSIS — Z23 Encounter for immunization: Secondary | ICD-10-CM | POA: Diagnosis not present

## 2021-12-25 DIAGNOSIS — Z0001 Encounter for general adult medical examination with abnormal findings: Secondary | ICD-10-CM | POA: Insufficient documentation

## 2021-12-25 DIAGNOSIS — H6121 Impacted cerumen, right ear: Secondary | ICD-10-CM

## 2021-12-25 DIAGNOSIS — L219 Seborrheic dermatitis, unspecified: Secondary | ICD-10-CM

## 2021-12-25 MED ORDER — KETOCONAZOLE 2 % EX SHAM: 1.0000 | MEDICATED_SHAMPOO | CUTANEOUS | 0 refills | Status: AC

## 2021-12-25 NOTE — Patient Instructions (Signed)
It was a pleasure to see you today.  Thank you for giving [place] the opportunity to be involved in your care.  Below is a brief recap of your visit and next steps.  We will plan to see you again in 3 months.  Summary We completed your annual exam today You will receive your pneumonia vaccine I have prescribed ketoconazole shampoo to be used twice weekly for scalp relief Follow up in 3 months with labs to be completed prior to your appointment

## 2021-12-25 NOTE — Assessment & Plan Note (Addendum)
She endorses left knee pain today and plan to contact Dr. Romeo Apple (orthopedic surgery) to discuss repeat steroid injection.  She is interested in a brace for support.  I have recommended that she try a knee compression sleeve.

## 2021-12-25 NOTE — Assessment & Plan Note (Signed)
She endorses right ear fullness and hearing loss today.  On exam there is impacted cerumen of the right ear.  This was thoroughly irrigated today.

## 2021-12-25 NOTE — Assessment & Plan Note (Signed)
Presenting today for her annual exam.  Previous records and labs were reviewed. -Repeat labs have been ordered for follow-up in 3 months -PCV 20 administered today -I recommend that she receive outstanding zoster and Tdap vaccines at her pharmacy

## 2021-12-25 NOTE — Assessment & Plan Note (Signed)
Today she endorses itching over her posterior scalp.  There is dry skin and excoriation on exam.  I have prescribed ketoconazole 2% shampoo for twice weekly use.

## 2021-12-25 NOTE — Progress Notes (Signed)
Complete physical exam  Patient: Anna Robbins   DOB: [birth year]   88 y.o. Female  MRN: [id number]  Subjective:    Chief Complaint  Patient presents with   Annual Exam   Anna Robbins is a 88 y.o. female who presents today for a complete physical exam. She reports consuming a general diet. The patient does not participate in regular exercise at present. She generally feels fairly well. She reports sleeping well. She does have additional problems to discuss today including L knee pain, itching scalp, and right ear fullness.    Most recent fall risk assessment:    12/25/2021   10:58 AM  [place] in the past year? 0  Number falls in past yr: 0  Injury with Fall? 0  Risk for fall due to : No Fall Risks  Follow up Falls evaluation completed   Most recent depression screenings:    12/25/2021   10:58 AM 08/22/2021   11:46 AM  PHQ 2/9 Scores  PHQ - 2 Score 1 1  PHQ- 9 Score 10    Vision:Within last year and Dental: No current dental problems and No regular dental care   Past Medical History:  Diagnosis Date   Asthma    Diabetes mellitus without complication ([place])    Kidney stones 07/2017   required sx   Pulmonary emboli ([place]) 06/2017   Seizures (HCC)    Stroke [facility])    Past Surgical History:  Procedure Laterality Date   ABDOMINAL HYSTERECTOMY     APPENDECTOMY     thumb surgery     TONSILLECTOMY     Social History   Tobacco Use   Smoking status: Never   Smokeless tobacco: Never  Vaping Use   Vaping Use: Never used  Substance Use Topics   Alcohol use: Not Currently   Drug use: Not Currently   History reviewed. No pertinent family history. Allergies  Allergen Reactions   Asa [Aspirin]    Diphenhydramine Hcl    Lisinopril     cough   Melatonin    Molds & Smuts    Nsaids    Other    Penicillin G    Penicillins    Tramadol    Patient Care Team: Johnette Abraham, MD as PCP - General (Internal Medicine) Freada Bergeron, MD as PCP -  Cardiology (Cardiology)   Outpatient Medications Prior to Visit  Medication Sig   acetaminophen (TYLENOL) 500 MG tablet Take 500-1,000 mg by mouth every 6 (six) hours as needed.   albuterol (VENTOLIN HFA) 108 (90 Base) MCG/ACT inhaler Inhale into the lungs every 6 (six) hours as needed for wheezing or shortness of breath.   amLODipine (NORVASC) 5 MG tablet Take 1 tablet (5 mg total) by mouth daily.   aspirin EC 81 MG tablet Take 1 tablet (81 mg total) by mouth daily. Swallow whole.   carbamide peroxide (DEBROX) 6.5 % OTIC solution Place 5 drops into both ears 2 (two) times daily.   DULoxetine (CYMBALTA) 20 MG capsule TAKE 1 CAPSULE(20 MG) BY MOUTH DAILY   famotidine (PEPCID) 20 MG tablet TAKE 1 TABLET BY MOUTH AFTER SUPPER   FARXIGA 5 MG TABS tablet TAKE 1 TABLET(5 MG) BY MOUTH DAILY BEFORE BREAKFAST   fluticasone (FLONASE) 50 MCG/ACT nasal spray Place 2 sprays into both nostrils daily.   gabapentin (NEURONTIN) 300 MG capsule Take 2 capsules (600 mg total) by mouth at bedtime.   glipiZIDE (GLUCOTROL) 5 MG tablet TAKE 1 TABLET(5  MG) BY MOUTH TWICE DAILY   hydrocortisone (ANUSOL-HC) 25 MG suppository Place 1 suppository (25 mg total) rectally 2 (two) times daily.   Ketoconazole-Hydrocortisone 2-2.5 % CREA Apply 1 application topically daily.   levETIRAcetam (KEPPRA) 500 MG tablet Take 1 tablet (500 mg total) by mouth 2 (two) times daily.   metFORMIN (GLUCOPHAGE) 1000 MG tablet TAKE 1 TABLET(1000 MG) BY MOUTH TWICE DAILY WITH A MEAL   montelukast (SINGULAIR) 10 MG tablet TAKE 1 TABLET(10 MG) BY MOUTH AT BEDTIME   nystatin (MYCOSTATIN/NYSTOP) powder Apply 1 application topically 3 (three) times daily.   pantoprazole (PROTONIX) 40 MG tablet TAKE 1 TABLET(40 MG) BY MOUTH DAILY 30 TO 60 MINUTES BEFORE FIRST MEAL OF THE DAY   simvastatin (ZOCOR) 10 MG tablet Take 1 tablet (10 mg total) by mouth at bedtime.   Vibegron (GEMTESA) 75 MG TABS Take 1 tablet by mouth daily.   No facility-administered  medications prior to visit.   Review of Systems  HENT:  Positive for hearing loss (R ear fullness).   Musculoskeletal:  Positive for joint pain (L knee).  Skin:  Positive for itching (scalp).  All other systems reviewed and are negative.     Objective:     BP (!) 144/68   Pulse 82   Ht _0  (1.676 m)   Wt 201 lb 9.6 oz (91.4 kg)   SpO2 94%   BMI 32.54 kg/m  BP Readings from Last 3 Encounters:  12/25/21 (!) 144/68  12/02/21 110/67  09/23/21 (!) 146/58   Physical Exam Vitals reviewed.  Constitutional:      General: She is not in acute distress.    Appearance: Normal appearance. She is obese. She is not toxic-appearing.  HENT:     Head: Normocephalic and atraumatic.     Right Ear: External ear normal. There is impacted cerumen.     Left Ear: External ear normal.     Nose: Nose normal. No congestion or rhinorrhea.     Mouth/Throat:     Mouth: Mucous membranes are moist.     Pharynx: Oropharynx is clear. No oropharyngeal exudate or posterior oropharyngeal erythema.  Eyes:     General: No scleral icterus.    Extraocular Movements: Extraocular movements intact.     Conjunctiva/sclera: Conjunctivae normal.     Pupils: Pupils are equal, round, and reactive to light.  Cardiovascular:     Rate and Rhythm: Normal rate and regular rhythm.     Pulses: Normal pulses.     Heart sounds: Normal heart sounds. No murmur heard.    No friction rub. No gallop.  Pulmonary:     Effort: Pulmonary effort is normal.     Breath sounds: Normal breath sounds. No wheezing, rhonchi or rales.  Abdominal:     General: Abdomen is flat. Bowel sounds are normal. There is no distension.     Palpations: Abdomen is soft.     Tenderness: There is no abdominal tenderness.  Musculoskeletal:        General: No swelling.     Cervical back: Normal range of motion.     Right lower leg: No edema.     Left lower leg: No edema.     Comments: Crepitus appreciated with flexion / extension of left knee   Lymphadenopathy:     Cervical: No cervical adenopathy.  Skin:    General: Skin is warm and dry.     Capillary Refill: Capillary refill takes less than 2 seconds.     Coloration:  Skin is not jaundiced.     Comments: Dry skin with excoriations on  posterior scalp  Neurological:     General: No focal deficit present.     Mental Status: She is alert and oriented to person, place, and time.  Psychiatric:        Mood and Affect: Mood normal.        Behavior: Behavior normal.     Last CBC Lab Results  Component Value Date   WBC 7.1 07/24/2021   HGB 11.2 07/24/2021   HCT 34.7 07/24/2021   MCV 91 07/24/2021   MCH 29.3 07/24/2021   RDW 12.0 07/24/2021   PLT 313 54/65/0354   Last metabolic panel Lab Results  Component Value Date   GLUCOSE 176 (H) 05/22/2021   NA 139 05/22/2021   K 5.0 05/22/2021   CL 98 05/22/2021   CO2 18 (L) 05/22/2021   BUN 20 05/22/2021   CREATININE 0.96 05/22/2021   EGFR 57 (L) 05/22/2021   CALCIUM 9.8 05/22/2021   PROT 7.0 03/05/2021   ALBUMIN 5.0 (H) 03/05/2021   LABGLOB 2.0 03/05/2021   AGRATIO 2.5 (H) 03/05/2021   BILITOT 0.3 03/05/2021   ALKPHOS 57 03/05/2021   AST 14 03/05/2021   ALT 8 03/05/2021   Last lipids Lab Results  Component Value Date   CHOL 135 04/18/2021   HDL 69 04/18/2021   LDLCALC 54 04/18/2021   TRIG 56 04/18/2021   CHOLHDL 2.0 04/18/2021   Last hemoglobin A1c Lab Results  Component Value Date   HGBA1C 6.9 (H) 07/24/2021   Last thyroid functions Lab Results  Component Value Date   TSH 1.920 12/17/2020       Assessment & Plan:    Routine Health Maintenance and Physical Exam  Immunization History  Administered Date(s) Administered   Influenza,inj,Quad PF,6+ Mos 11/11/2020   Influenza-Unspecified 11/03/2021   Moderna SARS-COV2 Booster Vaccination 12/18/2019, 11/17/2021   Moderna Sars-Covid-2 Vaccination 03/22/2019, 04/19/2019   PNEUMOCOCCAL CONJUGATE-20 12/25/2021   RSV,unspecified 11/10/2021   Zoster  Recombinat (Shingrix) 10/27/2021   Health Maintenance  Topic Date Due   DTaP/Tdap/Td (1 - Tdap) Never done   Medicare Annual Wellness (AWV)  05/22/2021   Zoster Vaccines- Shingrix (2 of 2) 12/22/2021   COVID-19 Vaccine (5 - 2023-24 season) 01/12/2022   HEMOGLOBIN A1C  01/23/2022   FOOT EXAM  03/04/2022   OPHTHALMOLOGY EXAM  03/25/2022   Pneumonia Vaccine 52+ Years old  Completed   INFLUENZA VACCINE  Completed   HPV VACCINES  Aged Out   DEXA SCAN  Discontinued   Discussed health benefits of physical activity, and encouraged her to engage in regular exercise appropriate for her age and condition.  Problem List Items Addressed This Visit       Impacted cerumen, right ear    She endorses right ear fullness and hearing loss today.  On exam there is impacted cerumen of the right ear.  This was thoroughly irrigated today.      Osteoarthritis of both knees    She endorses left knee pain today and plan to contact Dr. Aline Brochure (orthopedic surgery) to discuss repeat steroid injection.  She is interested in a brace for support.  I have recommended that she try a knee compression sleeve.      Seborrheic dermatitis of scalp - Primary    Today she endorses itching over her posterior scalp.  There is dry skin and excoriation on exam.  I have prescribed ketoconazole 2% shampoo for twice weekly use.  Encounter for well adult exam with abnormal findings    Presenting today for her annual exam.  Previous records and labs were reviewed. -Repeat labs have been ordered for follow-up in 3 months -PCV 20 administered today -I recommend that she receive outstanding zoster and Tdap vaccines at her pharmacy      Return in about 3 months (around 03/26/2022).  Johnette Abraham, MD

## 2021-12-30 DIAGNOSIS — H01002 Unspecified blepharitis right lower eyelid: Secondary | ICD-10-CM | POA: Diagnosis not present

## 2021-12-30 DIAGNOSIS — H01004 Unspecified blepharitis left upper eyelid: Secondary | ICD-10-CM | POA: Diagnosis not present

## 2021-12-30 DIAGNOSIS — E113293 Type 2 diabetes mellitus with mild nonproliferative diabetic retinopathy without macular edema, bilateral: Secondary | ICD-10-CM | POA: Diagnosis not present

## 2021-12-30 DIAGNOSIS — H01001 Unspecified blepharitis right upper eyelid: Secondary | ICD-10-CM | POA: Diagnosis not present

## 2021-12-30 DIAGNOSIS — H353131 Nonexudative age-related macular degeneration, bilateral, early dry stage: Secondary | ICD-10-CM | POA: Diagnosis not present

## 2022-01-21 ENCOUNTER — Other Ambulatory Visit: Payer: Self-pay | Admitting: Nurse Practitioner

## 2022-01-21 DIAGNOSIS — F320 Major depressive disorder, single episode, mild: Secondary | ICD-10-CM

## 2022-02-18 ENCOUNTER — Ambulatory Visit (INDEPENDENT_AMBULATORY_CARE_PROVIDER_SITE_OTHER): Payer: Medicare Other

## 2022-02-18 VITALS — Ht 65.0 in | Wt 200.0 lb

## 2022-02-18 DIAGNOSIS — Z0000 Encounter for general adult medical examination without abnormal findings: Secondary | ICD-10-CM | POA: Diagnosis not present

## 2022-02-18 NOTE — Progress Notes (Signed)
Subjective:   Anna Robbins is a 89 y.o. female who presents for Medicare Annual (Subsequent) preventive examination.  Review of Systems       Objective:    There were no vitals filed for this visit. There is no height or weight on file to calculate BMI.     05/22/2020    2:11 PM  Advanced Directives  Does Patient Have a Medical Advance Directive? Yes  Type of Estate agent of [place];Living will  Does patient want to make changes to medical advance directive? No - Patient declined  Copy of Healthcare Power of Attorney in Chart? Yes - validated most recent copy scanned in chart (See row information)    Current Medications (verified) Outpatient Encounter Medications as of 02/18/2022  Medication Sig   ABRYSVO 120 MCG/0.5ML injection    FLUZONE HIGH-DOSE QUADRIVALENT 0.7 ML SUSY    SPIKEVAX syringe    acetaminophen (TYLENOL) 500 MG tablet Take 500-1,000 mg by mouth every 6 (six) hours as needed.   albuterol (VENTOLIN HFA) 108 (90 Base) MCG/ACT inhaler Inhale into the lungs every 6 (six) hours as needed for wheezing or shortness of breath.   amLODipine (NORVASC) 5 MG tablet Take 1 tablet (5 mg total) by mouth daily.   aspirin EC 81 MG tablet Take 1 tablet (81 mg total) by mouth daily. Swallow whole.   carbamide peroxide (DEBROX) 6.5 % OTIC solution Place 5 drops into both ears 2 (two) times daily.   DULoxetine (CYMBALTA) 20 MG capsule TAKE 1 CAPSULE(20 MG) BY MOUTH DAILY   famotidine (PEPCID) 20 MG tablet TAKE 1 TABLET BY MOUTH AFTER SUPPER   FARXIGA 5 MG TABS tablet TAKE 1 TABLET(5 MG) BY MOUTH DAILY BEFORE BREAKFAST   fluticasone (FLONASE) 50 MCG/ACT nasal spray Place 2 sprays into both nostrils daily.   gabapentin (NEURONTIN) 300 MG capsule TAKE 2 CAPSULES(600 MG) BY MOUTH AT BEDTIME   glipiZIDE (GLUCOTROL) 5 MG tablet TAKE 1 TABLET(5 MG) BY MOUTH TWICE DAILY   hydrocortisone (ANUSOL-HC) 25 MG suppository Place 1 suppository (25 mg total) rectally 2  (two) times daily.   ketoconazole (NIZORAL) 2 % shampoo Apply 1 Application topically 2 (two) times a week.   Ketoconazole-Hydrocortisone 2-2.5 % CREA Apply 1 application topically daily.   levETIRAcetam (KEPPRA) 500 MG tablet Take 1 tablet (500 mg total) by mouth 2 (two) times daily.   metFORMIN (GLUCOPHAGE) 1000 MG tablet TAKE 1 TABLET(1000 MG) BY MOUTH TWICE DAILY WITH A MEAL   montelukast (SINGULAIR) 10 MG tablet TAKE 1 TABLET(10 MG) BY MOUTH AT BEDTIME   nystatin (MYCOSTATIN/NYSTOP) powder Apply 1 application topically 3 (three) times daily.   pantoprazole (PROTONIX) 40 MG tablet TAKE 1 TABLET(40 MG) BY MOUTH DAILY 30 TO 60 MINUTES BEFORE FIRST MEAL OF THE DAY   simvastatin (ZOCOR) 10 MG tablet Take 1 tablet (10 mg total) by mouth at bedtime.   Vibegron (GEMTESA) 75 MG TABS Take 1 tablet by mouth daily.   No facility-administered encounter medications on file as of 02/18/2022.    Allergies (verified) Asa [aspirin], Diphenhydramine hcl, Lisinopril, Melatonin, Molds & smuts, Nsaids, Other, Penicillin g, Penicillins, and Tramadol   History: Past Medical History:  Diagnosis Date   Asthma    Diabetes mellitus without complication (HCC)    Kidney stones 07/2017   required sx   Pulmonary emboli (HCC) 06/2017   Seizures (HCC)    Stroke [facility])    Past Surgical History:  Procedure Laterality Date   ABDOMINAL HYSTERECTOMY  APPENDECTOMY     thumb surgery     TONSILLECTOMY     No family history on file. Social History   Socioeconomic History   Marital status: Widowed    Spouse name: Not on file   Number of children: Not on file   Years of education: Not on file   Highest education level: Not on file  Occupational History   Not on file  Tobacco Use   Smoking status: Never   Smokeless tobacco: Never  Vaping Use   Vaping Use: Never used  Substance and Sexual Activity   Alcohol use: Not Currently   Drug use: Not Currently   Sexual activity: Not Currently  Other Topics  Concern   Not on file  Social History Narrative   Lives with daughter and son in law and their 2 kids, also son in [place] mother.   Left Handed   Driinks 1-2 cups caffeine weekly   Social Determinants of Health   Financial Resource Strain: Low Risk  (05/22/2020)   Overall Financial Resource Strain (CARDIA)    Difficulty of Paying Living Expenses: Not hard at all  Food Insecurity: No Food Insecurity (05/22/2020)   Hunger Vital Sign    Worried About Running Out of Food in the Last Year: Never true    Ran Out of Food in the Last Year: Never true  Transportation Needs: No Transportation Needs (05/22/2020)   PRAPARE - Hydrologist (Medical): No    Lack of Transportation (Non-Medical): No  Physical Activity: Inactive (05/22/2020)   Exercise Vital Sign    Days of Exercise per Week: 0 days    Minutes of Exercise per Session: 0 min  Stress: No Stress Concern Present (05/22/2020)   [place]    Feeling of Stress : Only a little  Social Connections: Moderately Isolated (05/22/2020)   Social Connection and Isolation Panel [NHANES]    Frequency of Communication with Friends and Family: More than three times a week    Frequency of Social Gatherings with Friends and Family: More than three times a week    Attends Religious Services: More than 4 times per year    Active Member of [employer] or Organizations: No    Attends Archivist Meetings: Never    Marital Status: Widowed    Tobacco Counseling Counseling given: Not Answered   Clinical Intake:   How often do you need to have someone help you when you read instructions, pamphlets, or other written materials from your doctor or pharmacy?: (P) 3 - Sometimes  Diabetic? yes   Activities of Daily Living    02/16/2022    5:45 PM  In your present state of health, do you have any difficulty performing the following activities:  Hearing? 0   Vision? 0  Difficulty concentrating or making decisions? 1  Walking or climbing stairs? 1  Dressing or bathing? 1  Doing errands, shopping? 1  Preparing Food and eating ? Y  Using the Toilet? Y  In the past six months, have you accidently leaked urine? Y  Do you have problems with loss of bowel control? Y  Managing your Medications? Y  Managing your Finances? Y  Housekeeping or managing your Housekeeping? Y    Patient Care Team: Johnette Abraham, MD as PCP - General (Internal Medicine) Freada Bergeron, MD as PCP - Cardiology (Cardiology)  Indicate any recent Medical Services you may have received from other  than [facility] providers in the past year (date may be approximate).     Assessment:   This is a routine wellness examination for [place].  Hearing/Vision screen No results found.  Dietary issues and exercise activities discussed:     Goals Addressed   None   Depression Screen    12/25/2021   10:58 AM 08/22/2021   11:46 AM 07/24/2021    1:58 PM 04/18/2021    1:46 PM 03/04/2021    2:49 PM 10/25/2020   11:26 AM 06/25/2020   11:04 AM  PHQ 2/9 Scores  PHQ - 2 Score 1 1 0 1 3 0 0  PHQ- 9 Score 10   4 8       Fall Risk    02/16/2022    5:45 PM 12/25/2021   10:58 AM 08/22/2021   11:46 AM 07/24/2021    1:57 PM 04/18/2021    1:46 PM  Fall Risk   Falls in the past year? 1 0 0 0 0  Number falls in past yr: 1 0 0 0 0  Injury with Fall? 0 0 0 0 0  Risk for fall due to :  No Fall Risks No Fall Risks No Fall Risks No Fall Risks  Follow up  Falls evaluation completed Falls evaluation completed Falls evaluation completed Falls evaluation completed    [place]:  Any stairs in or around the home? Yes  If so, are there any without handrails? Yes  Home free of loose throw rugs in walkways, pet beds, electrical cords, etc? Yes  Adequate lighting in your home to reduce risk of falls? Yes   ASSISTIVE DEVICES UTILIZED TO PREVENT FALLS:  Life  alert? Yes  Use of a cane, walker or w/c? Yes  Grab bars in the bathroom? Yes  Shower chair or bench in shower? Yes  Elevated toilet seat or a handicapped toilet? Yes    Cognitive Function:    03/25/2021    1:45 PM  MMSE - Mini Mental State Exam  Orientation to time 5  Orientation to Place 4  Registration 3  Attention/ Calculation 2  Recall 3  Language- name 2 objects 2  Language- repeat 1  Language- follow 3 step command 3  Language- read & follow direction 1  Write a sentence 1  Copy design 1  Total score 26        Immunizations Immunization History  Administered Date(s) Administered   Influenza,inj,Quad PF,6+ Mos 11/11/2020   Influenza-Unspecified 11/03/2021   Moderna SARS-COV2 Booster Vaccination 12/18/2019, 11/17/2021   Moderna Sars-Covid-2 Vaccination 03/22/2019, 04/19/2019   PNEUMOCOCCAL CONJUGATE-20 12/25/2021   RSV,unspecified 11/10/2021   Zoster Recombinat (Shingrix) 10/27/2021    TDAP status: Due, Education has been provided regarding the importance of this vaccine. Advised may receive this vaccine at local pharmacy or Health Dept. Aware to provide a copy of the vaccination record if obtained from local pharmacy or Health Dept. Verbalized acceptance and understanding.  Flu Vaccine status: Up to date  Pneumococcal vaccine status: Up to date  Covid-19 vaccine status: Declined, Education has been provided regarding the importance of this vaccine but patient still declined. Advised may receive this vaccine at local pharmacy or Health Dept.or vaccine clinic. Aware to provide a copy of the vaccination record if obtained from local pharmacy or Health Dept. Verbalized acceptance and understanding.  Qualifies for Shingles Vaccine? Yes   Zostavax completed No   Shingrix Completed?: No.    Education has been provided regarding the  importance of this vaccine. Patient has been advised to call insurance company to determine out of pocket expense if they have not yet  received this vaccine. Advised may also receive vaccine at local pharmacy or Health Dept. Verbalized acceptance and understanding.  Screening Tests Health Maintenance  Topic Date Due   DTaP/Tdap/Td (1 - Tdap) Never done   Zoster Vaccines- Shingrix (2 of 2) 12/22/2021   COVID-19 Vaccine (5 - 2023-24 season) 01/12/2022   HEMOGLOBIN A1C  01/23/2022   FOOT EXAM  03/04/2022   OPHTHALMOLOGY EXAM  03/25/2022   Medicare Annual Wellness (AWV)  02/19/2023   Pneumonia Vaccine 36+ Years old  Completed   INFLUENZA VACCINE  Completed   HPV VACCINES  Aged Out   DEXA SCAN  Discontinued    Health Maintenance  Health Maintenance Due  Topic Date Due   DTaP/Tdap/Td (1 - Tdap) Never done   Zoster Vaccines- Shingrix (2 of 2) 12/22/2021   COVID-19 Vaccine (5 - 2023-24 season) 01/12/2022   HEMOGLOBIN A1C  01/23/2022    Colorectal cancer screening: No longer required.   Mammogram status: No longer required due to age.  Bone density scan: discontinued  Lung Cancer Screening: (Low Dose CT Chest recommended if Age 31-80 years, 30 pack-year currently smoking OR have quit w/in 15years.) does not qualify.   Lung Cancer Screening Referral: NA  Additional Screening:  Hepatitis C Screening: does not qualify.  Vision Screening: Recommended annual ophthalmology exams for early detection of glaucoma and other disorders of the eye. Is the patient up to date with their annual eye exam?  Yes  Who is the provider or what is the name of the office in which the patient attends annual eye exams? Cannot remember name of location If pt is not established with a provider, would they like to be referred to a provider to establish care? No .   Dental Screening: Recommended annual dental exams for proper oral hygiene  Community Resource Referral / Chronic Care Management: CRR required this visit?  No   CCM required this visit?  No      Plan:     I have personally reviewed and noted the following in the  patient's chart:   Medical and social history Use of alcohol, tobacco or illicit drugs  Current medications and supplements including opioid prescriptions. Patient is not currently taking opioid prescriptions. Functional ability and status Nutritional status Physical activity Advanced directives List of other physicians Hospitalizations, surgeries, and ER visits in previous 12 months Vitals Screenings to include cognitive, depression, and falls Referrals and appointments  In addition, I have reviewed and discussed with patient certain preventive protocols, quality metrics, and best practice recommendations. A written personalized care plan for preventive services as well as general preventive health recommendations were provided to patient.     Marlana Salvage, CMA   02/18/2022

## 2022-02-18 NOTE — Addendum Note (Signed)
Addended by: Smitty Knudsen on: 02/18/2022 02:54 PM   Modules accepted: Orders

## 2022-02-19 ENCOUNTER — Telehealth: Payer: Self-pay | Admitting: Internal Medicine

## 2022-02-19 NOTE — Telephone Encounter (Signed)
Patient returning call said she was told to return nurse call asked to return call with more information, return call [phone]

## 2022-02-23 ENCOUNTER — Telehealth: Payer: Self-pay | Admitting: *Deleted

## 2022-02-23 ENCOUNTER — Other Ambulatory Visit: Payer: Self-pay | Admitting: Nurse Practitioner

## 2022-02-23 DIAGNOSIS — I10 Essential (primary) hypertension: Secondary | ICD-10-CM

## 2022-02-23 NOTE — Telephone Encounter (Signed)
Spoke with patient's DIL.

## 2022-02-23 NOTE — Progress Notes (Signed)
  Care Coordination   Note   02/23/2022 Name: Anna Robbins MRN: [id number] DOB: 1932/06/06  Anna Robbins is a 89 y.o. year old female who sees Doren Custard, Hazle Nordmann, MD for primary care. I reached out to [name] by phone today to offer care coordination services.  [name] was given information about Care Coordination services today including:   The Care Coordination services include support from the care team which includes your Nurse Coordinator, Clinical Social Worker, or Pharmacist.  The Care Coordination team is here to help remove barriers to the health concerns and goals most important to you. Care Coordination services are voluntary, and the patient may decline or stop services at any time by request to their care team member.   Care Coordination Consent Status: Patient friend [name] DPR on file agreed to services and verbal consent obtained.   Follow up plan:  Telephone appointment with care coordination team member scheduled for:  03/02/22  Encounter Outcome:  Pt. Scheduled [place]  Direct Dial: [phone]

## 2022-02-27 ENCOUNTER — Other Ambulatory Visit: Payer: Self-pay | Admitting: Family Medicine

## 2022-02-27 DIAGNOSIS — N1831 Chronic kidney disease, stage 3a: Secondary | ICD-10-CM

## 2022-02-27 DIAGNOSIS — E1165 Type 2 diabetes mellitus with hyperglycemia: Secondary | ICD-10-CM

## 2022-03-02 ENCOUNTER — Ambulatory Visit: Payer: Self-pay | Admitting: Licensed Clinical Social Worker

## 2022-03-02 NOTE — Patient Instructions (Signed)
Visit Information  Thank you for taking time to visit with me today. Please don't hesitate to contact me if I can be of assistance to you before our next scheduled telephone appointment.  Following are the goals we discussed today:   Our next appointment is by telephone on 04/06/22 at 1:00 PM   Please call the care guide team at [phone] if you need to cancel or reschedule your appointment.   If you are experiencing a Mental Health or [place] or need someone to talk to, please call the [facility]: [phone]   Following is a copy of your full plan of care:   Interventions: LCSW talked via phone today with Anna Robbins, client and with Anna Robbins, friend of client Anna Robbins is on client contact list) Discussed medication procurement of client. Discussed meal delivery for client. She said her family members often cook meals and they bring meals to her as needed. Discussed ambulation of client. She said she has difficulty standing; she uses walker to help her ambulate Discussed pain issues. She spoke of knee pain issues Discussed sleeping issues. She said she usually goes to sleep about 1:00 AM and sleeps  until about 10:00 AM Discussed family support. She has support of daughter, son in law, 2 grandsons and support of friend, Anna Robbins Discussed in home support services with [facility] in [place], [place]. LCSW gave client the phone number for [facility] and client and family will call [facility] to discuss in home support services available Discussed Care Coordination program support with RN, LCSW and Pharmacist Provided counseling support to client Reviewed breathing issues. Client has shortness of breath. She said sometimes when she is just sitting in a chair she could have shortness of breath.   Discussed faith background. She attends [place] meetings and is active in that faith. Reviewed transport needs. She has transport support  from family and friends. Reviewed mood. She said she is more stable now in her mood. She is taking medications as prescribed. She enjoys watching certain TV programs. She enjoys Bible study group participation Discussed support with PCP Client agreed for LCSW to return call to her on April 06, 2022 at 1:00 PM to further discuss her care needs. She was appreciative of call from LCSW today  Anna Robbins was given information about Care Management services by the embedded care coordination team including:  Care Management services include personalized support from designated clinical staff supervised by her physician, including individualized plan of care and coordination with other care providers 24/7 contact phone numbers for assistance for urgent and routine care needs. The patient may stop CCM services at any time (effective at the end of the month) by phone call to the office staff.  Patient agreed to services and verbal consent obtained.   Anna Robbins.Anna Robbins MSW, [place] Holiday representative [facility] Care Management [phone]

## 2022-03-02 NOTE — Patient Outreach (Signed)
  Care Coordination   Initial Visit Note   03/02/2022 Name: Anna Robbins MRN: [id number] DOB: 03-01-1932  Anna Robbins is a 89 y.o. year old female who sees Doren Custard, Hazle Nordmann, MD for primary care. I spoke with  Anna Robbins by phone today.  What matters to the patients health and wellness today? Patient is interested in learning about in home care support for a few hours weekly   Goals Addressed               This Visit's Progress     patient is interested in learing about in home care suppoort for a few hours weekly (pt-stated)        Interventions: LCSW talked via phone today with Anna Robbins, client and with Anna Robbins, friend of client Anna Robbins is on client contact list) Discussed medication procurement of client. Discussed meal delivery for client. She said her family members often cook meals and they bring meals to her as needed. Discussed ambulation of client. She said she has difficulty standing; she uses walker to help her ambulate Discussed pain issues. She spoke of knee pain issues Discussed sleeping issues. She said she usually goes to sleep about 1:00 AM and sleeps  until about 10:00 AM Discussed family support. She has support of daughter, son in law, 2 grandsons and support of friend, Anna Robbins Discussed in home support services with [facility] in [place], [place]. LCSW gave client the phone number for [facility] and client and family will call [facility] to discuss in home support services available Discussed Care Coordination program support with RN, LCSW and Pharmacist Provided counseling support to client Reviewed breathing issues. Client has shortness of breath. She said sometimes when she is just sitting in a chair she could have shortness of breath.   Discussed faith background. She attends [place] meetings and is active in that faith. Reviewed transport needs. She has transport support from family and friends. Reviewed mood. She said she  is more stable now in her mood. She is taking medications as prescribed. She enjoys watching certain TV programs. She enjoys Bible study group participation Discussed support with PCP Client agreed for LCSW to return call to her on April 06, 2022 at 1:00 PM to further discuss her care needs. She was appreciative of call from LCSW todaya      SDOH assessments and interventions completed:  Yes  SDOH Interventions Today    Flowsheet Row Most Recent Value  SDOH Interventions   Depression Interventions/Treatment  Medication, Counseling  Physical Activity Interventions Other (Comments)  [client uses walker to help her walk]  Stress Interventions Provide Counseling  [client has some stress related to managing daily care needs]        Care Coordination Interventions:  Yes, provided   Follow up plan: Follow up call scheduled for 04/06/22 at 1:00 PM    Encounter Outcome:  Pt. Visit Completed   [name].Jazman Reuter MSW, [place] Holiday representative [facility] Care Management [phone]

## 2022-03-06 ENCOUNTER — Other Ambulatory Visit: Payer: Self-pay | Admitting: Nurse Practitioner

## 2022-03-06 DIAGNOSIS — E785 Hyperlipidemia, unspecified: Secondary | ICD-10-CM

## 2022-03-13 NOTE — Progress Notes (Unsigned)
Cardiology Office Note:    Date:  03/16/2022   ID:  Anna Robbins, DOB [birth year], MRN [id number]  PCP:  Johnette Abraham, MD   [facility] Providers Cardiologist:  Freada Bergeron, MD {   Referring MD: Johnette Abraham, MD     History of Present Illness:    Anna Robbins is a 89 y.o. female with a hx of asthma, DMII, history of PE (2019), seizures, and CVA in 2020 who presents to clinic for follow-up.  Pain was initially seen on 11/2020 for chest pain. Myoview with no ischemia. EF 64%. Low risk.  Was last seen in clinic on 11/2021 where she was doing well from a CV standpoint. Had one episode of atypical chest pain that occurred after sweeping the floor that seemed MSK in nature. She declined TTE at that point.   Today, the patient overall feels well today. She has been walking more without her walker and feels well with activity. She has been doing a lot of her own ADLs and is able to care for her dogs without issue. Continues to have intermittent sharp chest pain with sweeping and certain movements of her arms (has been chronic). Symptoms improve with a heat pad/resting. No SOB, orthopnea, PND, or LE edema. Continues to have intermittent dizziness following her stroke.    Past Medical History:  Diagnosis Date   Asthma    Diabetes mellitus without complication ([place])    Kidney stones 07/2017   required sx   Pulmonary emboli ([place]) 06/2017   Seizures (HCC)    Stroke [facility])     Past Surgical History:  Procedure Laterality Date   ABDOMINAL HYSTERECTOMY     APPENDECTOMY     thumb surgery     TONSILLECTOMY      Current Medications: Current Meds  Medication Sig   ABRYSVO 120 MCG/0.5ML injection    acetaminophen (TYLENOL) 500 MG tablet Take 500-1,000 mg by mouth every 6 (six) hours as needed.   albuterol (VENTOLIN HFA) 108 (90 Base) MCG/ACT inhaler Inhale into the lungs every 6 (six) hours as needed for wheezing or shortness of breath.   amLODipine (NORVASC) 5  MG tablet Take 1 tablet (5 mg total) by mouth daily.   aspirin EC 81 MG tablet Take 1 tablet (81 mg total) by mouth daily. Swallow whole.   carbamide peroxide (DEBROX) 6.5 % OTIC solution Place 5 drops into both ears 2 (two) times daily.   DULoxetine (CYMBALTA) 20 MG capsule TAKE 1 CAPSULE(20 MG) BY MOUTH DAILY   famotidine (PEPCID) 20 MG tablet TAKE 1 TABLET BY MOUTH AFTER SUPPER   FARXIGA 5 MG TABS tablet TAKE 1 TABLET(5 MG) BY MOUTH DAILY BEFORE BREAKFAST   fluticasone (FLONASE) 50 MCG/ACT nasal spray Place 2 sprays into both nostrils daily.   FLUZONE HIGH-DOSE QUADRIVALENT 0.7 ML SUSY    gabapentin (NEURONTIN) 300 MG capsule TAKE 2 CAPSULES(600 MG) BY MOUTH AT BEDTIME   glipiZIDE (GLUCOTROL) 5 MG tablet TAKE 1 TABLET(5 MG) BY MOUTH TWICE DAILY   hydrocortisone (ANUSOL-HC) 25 MG suppository Place 1 suppository (25 mg total) rectally 2 (two) times daily.   ketoconazole (NIZORAL) 2 % shampoo Apply 1 Application topically 2 (two) times a week.   Ketoconazole-Hydrocortisone 2-2.5 % CREA Apply 1 application topically daily.   levETIRAcetam (KEPPRA) 500 MG tablet Take 1 tablet (500 mg total) by mouth 2 (two) times daily.   metFORMIN (GLUCOPHAGE) 1000 MG tablet TAKE 1 TABLET(1000 MG) BY MOUTH TWICE DAILY WITH A MEAL  montelukast (SINGULAIR) 10 MG tablet TAKE 1 TABLET(10 MG) BY MOUTH AT BEDTIME   nystatin (MYCOSTATIN/NYSTOP) powder Apply 1 application topically 3 (three) times daily.   pantoprazole (PROTONIX) 40 MG tablet TAKE 1 TABLET(40 MG) BY MOUTH DAILY 30 TO 60 MINUTES BEFORE FIRST MEAL OF THE DAY   simvastatin (ZOCOR) 10 MG tablet Take 1 tablet (10 mg total) by mouth at bedtime.   SPIKEVAX syringe    Vibegron (GEMTESA) 75 MG TABS Take 1 tablet by mouth daily.     Allergies:   Asa [aspirin], Diphenhydramine hcl, Lisinopril, Melatonin, Molds & smuts, Nsaids, Other, Penicillin g, Penicillins, and Tramadol   Social History   Socioeconomic History   Marital status: Widowed    Spouse name:  Not on file   Number of children: Not on file   Years of education: Not on file   Highest education level: Not on file  Occupational History   Not on file  Tobacco Use   Smoking status: Never   Smokeless tobacco: Never  Vaping Use   Vaping Use: Never used  Substance and Sexual Activity   Alcohol use: Not Currently   Drug use: Not Currently   Sexual activity: Not Currently  Other Topics Concern   Not on file  Social History Narrative   Lives with daughter and son in law and their 2 kids, also son in [place] mother.   Left Handed   Driinks 1-2 cups caffeine weekly   Social Determinants of Health   Financial Resource Strain: Low Risk  (02/18/2022)   Overall Financial Resource Strain (CARDIA)    Difficulty of Paying Living Expenses: Not hard at all  Food Insecurity: No Food Insecurity (02/18/2022)   Hunger Vital Sign    Worried About Running Out of Food in the Last Year: Never true    Ran Out of Food in the Last Year: Never true  Transportation Needs: No Transportation Needs (02/18/2022)   PRAPARE - Hydrologist (Medical): No    Lack of Transportation (Non-Medical): No  Physical Activity: Inactive (03/02/2022)   Exercise Vital Sign    Days of Exercise per Week: 0 days    Minutes of Exercise per Session: 0 min  Stress: Stress Concern Present (03/02/2022)   [place]    Feeling of Stress : To some extent  Social Connections: Moderately Integrated (02/18/2022)   Social Connection and Isolation Panel [NHANES]    Frequency of Communication with Friends and Family: More than three times a week    Frequency of Social Gatherings with Friends and Family: More than three times a week    Attends Religious Services: More than 4 times per year    Active Member of [employer] or Organizations: Yes    Attends Archivist Meetings: More than 4 times per year    Marital Status: Widowed     Family  History: The patient's family history is not on file.  ROS:   Please see the history of present illness.    Review of Systems  Constitutional:  Negative for chills and fever.  Respiratory:  Positive for shortness of breath.   Cardiovascular:  Positive for chest pain. Negative for palpitations, orthopnea, claudication, leg swelling and PND.  Gastrointestinal:  Negative for abdominal pain and melena.  Genitourinary:  Negative for hematuria.  Musculoskeletal:  Positive for joint pain and myalgias.  Neurological:  Negative for loss of consciousness.     EKGs/Labs/Other Studies  Reviewed:    The following studies were reviewed today: No recent cardiac testing  EKG:  EKG is  ordered today.  The ekg ordered today demonstrates NSR with HR 76  Recent Labs: 05/22/2021: BUN 20; Creatinine, Ser 0.96; Potassium 5.0; Sodium 139 07/24/2021: Hemoglobin 11.2; Platelets 313  Recent Lipid Panel    Component Value Date/Time   CHOL 135 04/18/2021 1439   TRIG 56 04/18/2021 1439   HDL 69 04/18/2021 1439   CHOLHDL 2.0 04/18/2021 1439   LDLCALC 54 04/18/2021 1439            Physical Exam:    VS:  BP 138/70   Pulse 93   Ht 5' 5"$  (1.651 m)   Wt 200 lb 12.8 oz (91.1 kg)   SpO2 95%   BMI 33.41 kg/m     Wt Readings from Last 3 Encounters:  03/16/22 200 lb 12.8 oz (91.1 kg)  02/18/22 200 lb (90.7 kg)  12/25/21 201 lb 9.6 oz (91.4 kg)     GEN:  Well nourished, well developed in no acute distress HEENT: Normal NECK: No JVD; No carotid bruits CARDIAC: RRR, no murmurs, rubs, gallops RESPIRATORY:  Clear to auscultation without rales, wheezing or rhonchi  ABDOMEN: Soft, non-tender, non-distended MUSCULOSKELETAL:  Trace edema; No deformity  SKIN: Warm and dry NEUROLOGIC:  Alert and oriented x 3 PSYCHIATRIC:  Normal affect   ASSESSMENT:    1. Chest pain, unspecified type   2. Hyperlipidemia, unspecified hyperlipidemia type   3. Essential hypertension   4. Cerebrovascular accident (CVA),  unspecified mechanism ([place])   5. Swelling of left lower extremity   6. Systolic murmur    PLAN:    In order of problems listed above:  #Chest Pain: Atypical and worsened with certain movements of the arm. Myoview 11/2020 negative for ischemia. EF 64%. -Continue ASA 13m daily -Continue simvastatin 129mdaily  #History of CV[place]: [place] Per [place], TTE normal and no known arrhythmias on monitor. -Continue ASA 8132maily -Continue simvastatin 49m2mily -Follow-up with neuro as scheduled  #Systolic Murmur: -Check TTE in 06/2022 (wants to wait until summer to have it done)  #LLE Swelling/Bruising: Patient with LLE hematoma after injury from her dog. She is concerned she may have a clot given palpable chord and sedentary lifestyle. Will check LLE doppler for further evaluation. -Check LLE ultrasound   #HLD: LDL well controlled at 54. -Continue simva 49mg78mly -Management per PCP   #HTN: Blood pressure is well controlled.  -Continue amlodipine 5mg d97my   #DMII: A1C 6.6. -Managed by PCP  #Seizure Disorder: -Management per Neuro     Medication Adjustments/Labs and Tests Ordered: Current medicines are reviewed at length with the patient today.  Concerns regarding medicines are outlined above.  Orders Placed This Encounter  Procedures   ECHOCARDIOGRAM COMPLETE   VAS US [place]OW[place] EXTREMITY VENOUS (DVT)   No orders of the defined types were placed in this encounter.   Patient Instructions  Medication Instructions:   Your physician recommends that you continue on your current medications as directed. Please refer to the Current Medication list given to you today.  *If you need a refill on your cardiac medications before your next appointment, please call your pharmacy*   Testing/Procedures:  Your physician has requested that you have an echocardiogram. Echocardiography is a painless test that uses sound waves  to create images of your heart. It provides your doctor with information about the size and  shape of your heart and how well your heart's chambers and valves are working. This procedure takes approximately one hour. There are no restrictions for this procedure.  SCHEDULE ECHO TO BE DONE IN JUNE 2024 PER DR. Johney Frame   Please do NOT wear cologne, perfume, aftershave, or lotions (deodorant is allowed). Please arrive 15 minutes prior to your appointment time.    Your physician has requested that you have a LEFT lower  extremity venous duplex. This test is an ultrasound of the veins in the legs or arms. It looks at venous blood flow that carries blood from the heart to the legs or arms. Allow one hour for a Lower Venous exam. Allow thirty minutes for an Upper Venous exam. There are no restrictions or special instructions.     Follow-Up: At [facility], you and your health needs are our priority.  As part of our continuing mission to provide you with exceptional heart care, we have created designated Provider Care Teams.  These Care Teams include your primary Cardiologist (physician) and Advanced Practice Providers (APPs -  Physician Assistants and Nurse Practitioners) who all work together to provide you with the care you need, when you need it.  We recommend signing up for the patient portal called "MyChart".  Sign up information is provided on this After Visit Summary.  MyChart is used to connect with patients for Virtual Visits (Telemedicine).  Patients are able to view lab/test results, encounter notes, upcoming appointments, etc.  Non-urgent messages can be sent to your provider as well.   To learn more about what you can do with MyChart, go to [email or web address].    Your next appointment:   6 month(s)  Provider:   Nicholes Rough, PA-C, Melina Copa, PA-C, Ambrose Pancoast, NP, Cecilie Kicks, NP, Ermalinda Barrios, PA-C, Christen Bame, NP, or Richardson Dopp, PA-C             Signed, Freada Bergeron, MD  03/16/2022 2:43 PM    [place]

## 2022-03-16 ENCOUNTER — Ambulatory Visit: Payer: Medicare Other | Attending: Cardiology | Admitting: Cardiology

## 2022-03-16 ENCOUNTER — Encounter: Payer: Self-pay | Admitting: Cardiology

## 2022-03-16 VITALS — BP 138/70 | HR 93 | Ht 65.0 in | Wt 200.8 lb

## 2022-03-16 DIAGNOSIS — R079 Chest pain, unspecified: Secondary | ICD-10-CM | POA: Diagnosis not present

## 2022-03-16 DIAGNOSIS — I639 Cerebral infarction, unspecified: Secondary | ICD-10-CM | POA: Diagnosis not present

## 2022-03-16 DIAGNOSIS — E785 Hyperlipidemia, unspecified: Secondary | ICD-10-CM | POA: Diagnosis not present

## 2022-03-16 DIAGNOSIS — M7989 Other specified soft tissue disorders: Secondary | ICD-10-CM

## 2022-03-16 DIAGNOSIS — I10 Essential (primary) hypertension: Secondary | ICD-10-CM

## 2022-03-16 DIAGNOSIS — R011 Cardiac murmur, unspecified: Secondary | ICD-10-CM | POA: Diagnosis not present

## 2022-03-16 NOTE — Patient Instructions (Signed)
Medication Instructions:   Your physician recommends that you continue on your current medications as directed. Please refer to the Current Medication list given to you today.  *If you need a refill on your cardiac medications before your next appointment, please call your pharmacy*   Testing/Procedures:  Your physician has requested that you have an echocardiogram. Echocardiography is a painless test that uses sound waves to create images of your heart. It provides your doctor with information about the size and shape of your heart and how well your heart's chambers and valves are working. This procedure takes approximately one hour. There are no restrictions for this procedure.  SCHEDULE ECHO TO BE DONE IN JUNE 2024 PER DR. Johney Frame   Please do NOT wear cologne, perfume, aftershave, or lotions (deodorant is allowed). Please arrive 15 minutes prior to your appointment time.    Your physician has requested that you have a LEFT lower  extremity venous duplex. This test is an ultrasound of the veins in the legs or arms. It looks at venous blood flow that carries blood from the heart to the legs or arms. Allow one hour for a Lower Venous exam. Allow thirty minutes for an Upper Venous exam. There are no restrictions or special instructions.     Follow-Up: At [facility], you and your health needs are our priority.  As part of our continuing mission to provide you with exceptional heart care, we have created designated Provider Care Teams.  These Care Teams include your primary Cardiologist (physician) and Advanced Practice Providers (APPs -  Physician Assistants and Nurse Practitioners) who all work together to provide you with the care you need, when you need it.  We recommend signing up for the patient portal called "MyChart".  Sign up information is provided on this After Visit Summary.  MyChart is used to connect with patients for Virtual Visits (Telemedicine).  Patients are able  to view lab/test results, encounter notes, upcoming appointments, etc.  Non-urgent messages can be sent to your provider as well.   To learn more about what you can do with MyChart, go to [email or web address].    Your next appointment:   6 month(s)  Provider:   Nicholes Rough, PA-C, Melina Copa, PA-C, Ambrose Pancoast, NP, Cecilie Kicks, NP, Ermalinda Barrios, PA-C, Christen Bame, NP, or Richardson Dopp, PA-C

## 2022-03-19 DIAGNOSIS — N183 Chronic kidney disease, stage 3 unspecified: Secondary | ICD-10-CM | POA: Diagnosis not present

## 2022-03-19 DIAGNOSIS — E875 Hyperkalemia: Secondary | ICD-10-CM | POA: Diagnosis not present

## 2022-03-19 DIAGNOSIS — I10 Essential (primary) hypertension: Secondary | ICD-10-CM | POA: Diagnosis not present

## 2022-03-19 DIAGNOSIS — E785 Hyperlipidemia, unspecified: Secondary | ICD-10-CM | POA: Diagnosis not present

## 2022-03-19 DIAGNOSIS — E1165 Type 2 diabetes mellitus with hyperglycemia: Secondary | ICD-10-CM | POA: Diagnosis not present

## 2022-03-19 DIAGNOSIS — E559 Vitamin D deficiency, unspecified: Secondary | ICD-10-CM | POA: Diagnosis not present

## 2022-03-19 DIAGNOSIS — Z139 Encounter for screening, unspecified: Secondary | ICD-10-CM | POA: Diagnosis not present

## 2022-03-20 ENCOUNTER — Other Ambulatory Visit: Payer: Self-pay

## 2022-03-20 DIAGNOSIS — E785 Hyperlipidemia, unspecified: Secondary | ICD-10-CM

## 2022-03-20 LAB — LIPID PANEL
Chol/HDL Ratio: 2.2 ratio (ref 0.0–4.4)
Cholesterol, Total: 125 mg/dL (ref 100–199)
HDL: 57 mg/dL (ref >39)
LDL Chol Calc (NIH): 54 mg/dL (ref 0–99)
Triglycerides: 68 mg/dL (ref 0–149)
VLDL Cholesterol Cal: 14 mg/dL (ref 5–40)

## 2022-03-20 LAB — HEMOGLOBIN A1C
Est. average glucose Bld gHb Est-mCnc: 160 mg/dL
Hgb A1c MFr Bld: 7.2 % — ABNORMAL HIGH (ref 4.8–5.6)

## 2022-03-20 LAB — CMP14+EGFR
ALT: 8 IU/L (ref 0–32)
AST: 10 IU/L (ref 0–40)
Albumin/Globulin Ratio: 2.4 — ABNORMAL HIGH (ref 1.2–2.2)
Albumin: 4.6 g/dL (ref 3.7–4.7)
Alkaline Phosphatase: 54 IU/L (ref 44–121)
BUN/Creatinine Ratio: 27 (ref 12–28)
BUN: 25 mg/dL (ref 8–27)
Bilirubin Total: 0.3 mg/dL (ref 0.0–1.2)
CO2: 21 mmol/L (ref 20–29)
Calcium: 9.6 mg/dL (ref 8.7–10.3)
Chloride: 106 mmol/L (ref 96–106)
Creatinine, Ser: 0.94 mg/dL (ref 0.57–1.00)
Globulin, Total: 1.9 g/dL (ref 1.5–4.5)
Glucose: 180 mg/dL — ABNORMAL HIGH (ref 70–99)
Potassium: 4.8 mmol/L (ref 3.5–5.2)
Sodium: 143 mmol/L (ref 134–144)
Total Protein: 6.5 g/dL (ref 6.0–8.5)
eGFR: 59 mL/min/{1.73_m2} — ABNORMAL LOW (ref >59)

## 2022-03-20 LAB — VITAMIN D 25 HYDROXY (VIT D DEFICIENCY, FRACTURES): Vit D, 25-Hydroxy: 5.4 ng/mL — ABNORMAL LOW (ref 30.0–100.0)

## 2022-03-20 LAB — TSH: TSH: 1.86 u[IU]/mL (ref 0.450–4.500)

## 2022-03-20 MED ORDER — SIMVASTATIN 10 MG PO TABS: 10.0000 mg | ORAL_TABLET | Freq: Every day | ORAL | 3 refills | Status: AC

## 2022-03-23 ENCOUNTER — Ambulatory Visit (HOSPITAL_COMMUNITY)
Admission: RE | Admit: 2022-03-23 | Discharge: 2022-03-23 | Disposition: A | Discharge status: Home / Self Care | Payer: Medicare Other | Source: Ambulatory Visit | Attending: Cardiology | Admitting: Cardiology

## 2022-03-23 DIAGNOSIS — M7989 Other specified soft tissue disorders: Secondary | ICD-10-CM | POA: Insufficient documentation

## 2022-03-24 ENCOUNTER — Ambulatory Visit (INDEPENDENT_AMBULATORY_CARE_PROVIDER_SITE_OTHER): Payer: Medicare Other | Admitting: Internal Medicine

## 2022-03-24 ENCOUNTER — Encounter: Payer: Self-pay | Admitting: Internal Medicine

## 2022-03-24 VITALS — BP 112/69 | HR 101 | Ht 65.5 in | Wt 202.4 lb

## 2022-03-24 DIAGNOSIS — E559 Vitamin D deficiency, unspecified: Secondary | ICD-10-CM | POA: Diagnosis not present

## 2022-03-24 DIAGNOSIS — E782 Mixed hyperlipidemia: Secondary | ICD-10-CM

## 2022-03-24 DIAGNOSIS — E1165 Type 2 diabetes mellitus with hyperglycemia: Secondary | ICD-10-CM | POA: Diagnosis not present

## 2022-03-24 DIAGNOSIS — I10 Essential (primary) hypertension: Secondary | ICD-10-CM | POA: Diagnosis not present

## 2022-03-24 MED ORDER — VITAMIN D (ERGOCALCIFEROL) 1.25 MG (50000 UNIT) PO CAPS: 50000.0000 [IU] | ORAL_CAPSULE | ORAL | 0 refills | Status: AC

## 2022-03-24 NOTE — Patient Instructions (Signed)
It was a pleasure to see you today.  Thank you for giving [place] the opportunity to be involved in your care.  Below is a brief recap of your visit and next steps.  We will plan to see you again in 6 months.  Summary Start weekly, high dose vitamin D supplementation No additional medication changes today Follow up in 6 months

## 2022-03-24 NOTE — Progress Notes (Signed)
Established Patient Office Visit  Subjective   Patient ID: Anna Robbins, female    DOB: 1932/07/04  Age: 89 y.o. MRN: [id number]  Chief Complaint  Patient presents with   Follow-up    3 month   Anna Robbins returns to care today.  She was last seen by me on 12/25/21 for her annual exam.  Ketoconazole shampoo was prescribed at that time for seborrheic dermatitis of the scalp.  In the interim she has been seen by cardiology for follow-up.  There have otherwise been no acute interval events.  Anna Robbins endorses nasal congestion today but otherwise reports feeling well.  Past Medical History:  Diagnosis Date   Asthma    Diabetes mellitus without complication ([place])    Kidney stones 07/2017   required sx   Pulmonary emboli ([place]) 06/2017   Seizures (HCC)    Stroke [facility])    Past Surgical History:  Procedure Laterality Date   ABDOMINAL HYSTERECTOMY     APPENDECTOMY     thumb surgery     TONSILLECTOMY     Social History   Tobacco Use   Smoking status: Never   Smokeless tobacco: Never  Vaping Use   Vaping Use: Never used  Substance Use Topics   Alcohol use: Not Currently   Drug use: Not Currently   History reviewed. No pertinent family history. Allergies  Allergen Reactions   Asa [Aspirin]    Diphenhydramine Hcl    Lisinopril     cough   Melatonin    Molds & Smuts    Nsaids    Other    Penicillin G    Penicillins    Tramadol    Review of Systems  Constitutional:  Negative for chills and fever.  HENT:  Positive for congestion. Negative for sore throat.   Respiratory:  Negative for cough and shortness of breath.   Cardiovascular:  Negative for chest pain, palpitations and leg swelling.  Gastrointestinal:  Negative for abdominal pain, blood in stool, constipation, diarrhea, nausea and vomiting.  Genitourinary:  Negative for dysuria and hematuria.  Musculoskeletal:  Negative for myalgias.  Skin:  Negative for itching and rash.  Neurological:  Negative for  dizziness and headaches.  Psychiatric/Behavioral:  Negative for depression and suicidal ideas.      Objective:     BP 112/69   Pulse (!) 101   Ht 5' 5.5" (1.664 m)   Wt 202 lb 6.4 oz (91.8 kg)   SpO2 93%   BMI 33.17 kg/m  BP Readings from Last 3 Encounters:  03/24/22 112/69  03/16/22 138/70  12/25/21 138/72   Physical Exam Vitals reviewed.  Constitutional:      General: She is not in acute distress.    Appearance: Normal appearance. She is obese. She is not toxic-appearing.  HENT:     Head: Normocephalic and atraumatic.     Right Ear: External ear normal.     Left Ear: External ear normal.     Nose: Nose normal. No congestion or rhinorrhea.     Mouth/Throat:     Mouth: Mucous membranes are moist.     Pharynx: Oropharynx is clear. No oropharyngeal exudate or posterior oropharyngeal erythema.  Eyes:     General: No scleral icterus.    Extraocular Movements: Extraocular movements intact.     Conjunctiva/sclera: Conjunctivae normal.     Pupils: Pupils are equal, round, and reactive to light.  Cardiovascular:     Rate and Rhythm: Normal rate and regular rhythm.  Pulses: Normal pulses.     Heart sounds: Normal heart sounds. No murmur heard.    No friction rub. No gallop.  Pulmonary:     Effort: Pulmonary effort is normal.     Breath sounds: Normal breath sounds. No wheezing, rhonchi or rales.  Abdominal:     General: Abdomen is flat. Bowel sounds are normal. There is no distension.     Palpations: Abdomen is soft.     Tenderness: There is no abdominal tenderness.  Musculoskeletal:        General: Signs of injury (Bruising appreciated on medial aspect of the left leg below the knee) present. No swelling. Normal range of motion.     Cervical back: Normal range of motion.     Right lower leg: No edema.     Left lower leg: No edema.  Lymphadenopathy:     Cervical: No cervical adenopathy.  Skin:    General: Skin is warm and dry.     Capillary Refill: Capillary refill  takes less than 2 seconds.     Coloration: Skin is not jaundiced.  Neurological:     General: No focal deficit present.     Mental Status: She is alert and oriented to person, place, and time.  Psychiatric:        Mood and Affect: Mood normal.        Behavior: Behavior normal.    Diabetic foot exam was performed.  Visual Findings: Thickened, discolored toenail on first digit of left foot Posterior tibialis and dorsalis pulse intact bilaterally.  Intact to touch and monofilament testing bilaterally.    Last CBC Lab Results  Component Value Date   WBC 7.1 07/24/2021   HGB 11.2 07/24/2021   HCT 34.7 07/24/2021   MCV 91 07/24/2021   MCH 29.3 07/24/2021   RDW 12.0 07/24/2021   PLT 313 0000000   Last metabolic panel Lab Results  Component Value Date   GLUCOSE 180 (H) 03/19/2022   NA 143 03/19/2022   K 4.8 03/19/2022   CL 106 03/19/2022   CO2 21 03/19/2022   BUN 25 03/19/2022   CREATININE 0.94 03/19/2022   EGFR 59 (L) 03/19/2022   CALCIUM 9.6 03/19/2022   PROT 6.5 03/19/2022   ALBUMIN 4.6 03/19/2022   LABGLOB 1.9 03/19/2022   AGRATIO 2.4 (H) 03/19/2022   BILITOT 0.3 03/19/2022   ALKPHOS 54 03/19/2022   AST 10 03/19/2022   ALT 8 03/19/2022   Last lipids Lab Results  Component Value Date   CHOL 125 03/19/2022   HDL 57 03/19/2022   LDLCALC 54 03/19/2022   TRIG 68 03/19/2022   CHOLHDL 2.2 03/19/2022   Last hemoglobin A1c Lab Results  Component Value Date   HGBA1C 7.2 (H) 03/19/2022   Last thyroid functions Lab Results  Component Value Date   TSH 1.860 03/19/2022   Last vitamin D Lab Results  Component Value Date   VD25OH 5.4 (L) 03/19/2022     Assessment & Plan:   Problem List Items Addressed This Visit       Essential hypertension    She is currently prescribed amlodipine 5 mg daily for treatment of hypertension.  Her blood pressure today is 112/69. -No medication changes today      Type 2 diabetes mellitus with hyperglycemia, without  long-term current use of insulin (HCC)    A1c 7.2 on labs from earlier this month.  She is currently prescribed Farxiga 5 mg daily and metformin 1000 mg twice daily. -No additional  medication changes today -Recommended dietary changes in order to lower her blood sugar.  She endorses that there is significant room for improvement. -Diabetic foot exam completed today      Hyperlipidemia    Lipid panel updated earlier this month.  Total cholesterol 125 and LDL 54.  She is currently prescribed simvastatin 10 mg daily. -No medication changes today      Vitamin D deficiency - Primary    Noted on labs from last week.   -High-dose, weekly vitamin D supplementation x 12 weeks has been prescribed today       Return in about 6 months (around 09/22/2022).    Johnette Abraham, MD

## 2022-03-26 ENCOUNTER — Encounter: Payer: Self-pay | Admitting: Radiology

## 2022-03-30 DIAGNOSIS — E559 Vitamin D deficiency, unspecified: Secondary | ICD-10-CM | POA: Insufficient documentation

## 2022-03-30 NOTE — Assessment & Plan Note (Signed)
She is currently prescribed amlodipine 5 mg daily for treatment of hypertension.  Her blood pressure today is 112/69. -No medication changes today

## 2022-03-30 NOTE — Assessment & Plan Note (Addendum)
A1c 7.2 on labs from earlier this month.  She is currently prescribed Farxiga 5 mg daily and metformin 1000 mg twice daily. -No additional medication changes today -Recommended dietary changes in order to lower her blood sugar.  She endorses that there is significant room for improvement. -Diabetic foot exam completed today

## 2022-03-30 NOTE — Assessment & Plan Note (Signed)
Noted on labs from last week.   -High-dose, weekly vitamin D supplementation x 12 weeks has been prescribed today

## 2022-03-30 NOTE — Assessment & Plan Note (Signed)
Lipid panel updated earlier this month.  Total cholesterol 125 and LDL 54.  She is currently prescribed simvastatin 10 mg daily. -No medication changes today

## 2022-04-06 ENCOUNTER — Ambulatory Visit: Payer: Self-pay | Admitting: Licensed Clinical Social Worker

## 2022-04-06 NOTE — Patient Instructions (Signed)
Visit Information  Thank you for taking time to visit with me today. Please don't hesitate to contact me if I can be of assistance to you before our next scheduled telephone appointment.  Following are the goals we discussed today:    Our next appointment is by telephone on 04/29/22 at 9:30 AM   Please call the care guide team at [phone] if you need to cancel or reschedule your appointment.   If you are experiencing a Mental Health or [place] or need someone to talk to, please go to [facility] [place] [phone])   Following is a copy of your full plan of care:   Interventions: LCSW talked via phone today with [name], client and with [name], friend of client [name] is on client contact list) Discussed medication procurement of client.  Discussed ambulation of client. She said she has difficulty standing; she uses walker to help her ambulate Discussed sleeping issues. She said she usually goes to sleep about 1:00 AM and sleeps  until about 10:00 AM Discussed family support. She has support of daughter, son in law, 2 grandsons and support of friend, [name] Discussed in home support services with [employer] in [place], [place]. Client has contacted [employer] in [place], [place]  and is considering if she can afford for [employer] to help her one time weekly in the home for a certain number of hours  Discussed appetite of client. Encouraged client or [name] to call LCSW as needed for SW support for client at [phone].   [name] was given information about Care Management services by the embedded care coordination team including:  Care Management services include personalized support from designated clinical staff supervised by her physician, including individualized plan of care and coordination with other care providers 24/7 contact phone numbers for assistance for urgent and routine care  needs. The patient may stop CCM services at any time (effective at the end of the month) by phone call to the office staff.  Patient agreed to services and verbal consent obtained.   [name].Storey Stangeland MSW, [place] Holiday representative [facility] Care Management [phone]

## 2022-04-06 NOTE — Patient Outreach (Signed)
  Care Coordination   Follow Up Visit Note   04/06/2022 Name: Anna Robbins MRN: [id number] DOB: Oct 16, 1932  [name] is a 89 y.o. year old female who sees Doren Custard, Hazle Nordmann, MD for primary care. I spoke with  [name] by phone today.I also spoke via phone today with [name], friend and contact of client   What matters to the patients health and wellness today? Patient is interested in learning about   in home care support for client for a few hours weekly     Goals Addressed               This Visit's Progress     patient is interested in learing about in home care suppoort for a few hours weekly (pt-stated)        Interventions: LCSW talked via phone today with [name], client and with [name], friend of client [name] is on client contact list) Discussed medication procurement of client.  Discussed ambulation of client. She said she has difficulty standing; she uses walker to help her ambulate Discussed sleeping issues. She said she usually goes to sleep about 1:00 AM and sleeps  until about 10:00 AM Discussed family support. She has support of daughter, son in law, 2 grandsons and support of friend, [name] Discussed in home support services with [employer] in [place], [place]. Client has contacted [employer] in [place], [place]  and is considering if she can afford for [employer] to help her one time weekly in the home for a certain number of hours  Discussed appetite of client. Encouraged client or [name] to call LCSW as needed for SW support for client at [phone].      SDOH assessments and interventions completed:  Yes  SDOH Interventions Today    Flowsheet Row Most Recent Value  SDOH Interventions   Depression Interventions/Treatment  Counseling  Physical Activity Interventions Other (Comments)  [uses walker to ambulate]  Stress Interventions Provide Counseling  [has stress related to managing medical needs]        Care Coordination  Interventions:  Yes, provided   Interventions Today    Flowsheet Row Most Recent Value  Chronic Disease   Chronic disease during today's visit Other  [discussed in home care needs of client]  General Interventions   General Interventions Discussed/Reviewed General Interventions Discussed, [employer] [facility] support. Client has contacted [facility] in [place], [place] to talk with [facility] about possible in home support]  Education Interventions   Education Provided Provided Education  Provided Verbal Education [place] Discussed/Reviewed Coping Strategies, Anxiety        Follow up plan: Follow up call scheduled for 04/29/22 at 9:30 AM    Encounter Outcome:  Pt. Visit Completed   [name].Abra Lingenfelter MSW, [place] Holiday representative [facility] Care Management [phone]

## 2022-04-15 ENCOUNTER — Other Ambulatory Visit: Payer: Self-pay | Admitting: Family Medicine

## 2022-04-15 DIAGNOSIS — E1165 Type 2 diabetes mellitus with hyperglycemia: Secondary | ICD-10-CM

## 2022-04-29 ENCOUNTER — Ambulatory Visit: Payer: Self-pay | Admitting: Licensed Clinical Social Worker

## 2022-04-29 NOTE — Patient Outreach (Signed)
  Care Coordination   Follow Up Visit Note   04/29/2022 Name: Anna Robbins MRN: [id number] DOB: 09-Dec-1932  Anna Robbins is a 89 y.o. year old female who sees Doren Custard, Hazle Nordmann, MD for primary care. I spoke with  [name], contact for client via phone today about client needs.  What matters to the patients health and wellness today? Patient is interested in learning about in home  care support for client for a few hours weekly    Goals Addressed             This Visit's Progress    Patient is interested in learning about in home care support for a few hours weekly       Interventions: LCSW spoke via phone today with [name], contact for client, about client needs [name] reported that family has been in touch with [employer] to talk with [employer] agency about in home care support for client [name]. [name] said family will probably start with care giver coming 1 time per week for client Dicussed medication procurement. Discussed transport needs. Discussed sleeping issues Reviewed client ambulation. Client uses either a walker or a wheelchair as needed to help her ambulate Discussed appetite of client.  Discussed family support for client. Client has support with son, daughter and friend, [name] Encouraged [name] or family representative to call LCSW for SW support as needed at [phone]     SDOH assessments and interventions completed:  Yes  SDOH Interventions Today    Flowsheet Row Most Recent Value  SDOH Interventions   Physical Activity Interventions Other (Comments)  [uses a wheelchair or a walker to help her walk]  Stress Interventions Other (Comment)  [has stress related to managing medical needs]       Care Coordination Interventions:  Yes, provided   Interventions Today    Flowsheet Row Most Recent Value  Chronic Disease   Chronic disease during today's visit Other   [spoke via phone with [name], contact for client, about client needs]  General Interventions   General Interventions Discussed/Reviewed General Interventions Discussed, [employer]  [discussed program support]  Exercise Interventions   Exercise Discussed/Reviewed Physical Activity  [client uses wheelchair or walker to ambulate]  Education Interventions   Education Provided Provided Education  [place] Discussed/Reviewed Coping Strategies  Nutrition Interventions   Nutrition Discussed/Reviewed Nutrition Reviewed  Safety Interventions   Safety Discussed/Reviewed Fall Risk        Follow up plan: Follow up call scheduled for 06/16/22 at 3:30 PM     Encounter Outcome:  Pt. Visit Completed   [name].Jovan Colligan MSW, [place] Holiday representative [facility] Care Management [phone]

## 2022-04-29 NOTE — Patient Instructions (Signed)
Visit Information  Thank you for taking time to visit with me today. Please don't hesitate to contact me if I can be of assistance to you.   Following are the goals we discussed today:   Goals Addressed             This Visit's Progress    Patient is interested in learning about in home care support for a few hours weekly       Interventions: LCSW spoke via phone today with [name], contact for client, about client needs [name] reported that family has been in touch with [employer] to talk with [employer] agency about in home care support for client [name]. [name] said family will probably start with care giver coming 1 time per week for client Dicussed medication procurement. Discussed transport needs. Discussed sleeping issues Reviewed client ambulation. Client uses either a walker or a wheelchair as needed to help her ambulate Discussed appetite of client.  Discussed family support for client. Client has support with son, daughter and friend, [name] Encouraged [name] or family representative to call LCSW for SW support as needed at [phone]     Our next appointment is by telephone on 06/16/22 at 3:30 PM   Please call the care guide team at [phone] if you need to cancel or reschedule your appointment.   If you are experiencing a Mental Health or [place] or need someone to talk to, please go to [facility] [place] [phone])   The patient verbalized understanding of instructions, educational materials, and care plan provided today and DECLINED offer to receive copy of patient instructions, educational materials, and care plan.   The patient has been provided with contact information for the care management team and has been advised to call with any health related questions or concerns.   [name].Kindred Heying MSW, [place] [facility] Care Management [phone]

## 2022-04-30 ENCOUNTER — Other Ambulatory Visit: Payer: Self-pay | Admitting: Internal Medicine

## 2022-04-30 DIAGNOSIS — E1165 Type 2 diabetes mellitus with hyperglycemia: Secondary | ICD-10-CM

## 2022-04-30 DIAGNOSIS — F320 Major depressive disorder, single episode, mild: Secondary | ICD-10-CM

## 2022-04-30 DIAGNOSIS — J309 Allergic rhinitis, unspecified: Secondary | ICD-10-CM

## 2022-04-30 DIAGNOSIS — N1831 Chronic kidney disease, stage 3a: Secondary | ICD-10-CM

## 2022-05-31 ENCOUNTER — Emergency Department (HOSPITAL_COMMUNITY): Payer: Medicare Other

## 2022-05-31 ENCOUNTER — Other Ambulatory Visit: Payer: Self-pay

## 2022-05-31 ENCOUNTER — Emergency Department (HOSPITAL_COMMUNITY)
Admission: EM | Admit: 2022-05-31 | Discharge: 2022-05-31 | Disposition: A | Discharge status: Home / Self Care | Payer: Medicare Other | Attending: Emergency Medicine | Admitting: Emergency Medicine

## 2022-05-31 DIAGNOSIS — S59901A Unspecified injury of right elbow, initial encounter: Secondary | ICD-10-CM | POA: Diagnosis not present

## 2022-05-31 DIAGNOSIS — E119 Type 2 diabetes mellitus without complications: Secondary | ICD-10-CM | POA: Diagnosis not present

## 2022-05-31 DIAGNOSIS — M79662 Pain in left lower leg: Secondary | ICD-10-CM | POA: Insufficient documentation

## 2022-05-31 DIAGNOSIS — M25521 Pain in right elbow: Secondary | ICD-10-CM | POA: Diagnosis not present

## 2022-05-31 DIAGNOSIS — M79641 Pain in right hand: Secondary | ICD-10-CM | POA: Diagnosis not present

## 2022-05-31 DIAGNOSIS — W01198A Fall on same level from slipping, tripping and stumbling with subsequent striking against other object, initial encounter: Secondary | ICD-10-CM | POA: Diagnosis not present

## 2022-05-31 DIAGNOSIS — S6991XA Unspecified injury of right wrist, hand and finger(s), initial encounter: Secondary | ICD-10-CM | POA: Diagnosis not present

## 2022-05-31 DIAGNOSIS — S0083XA Contusion of other part of head, initial encounter: Secondary | ICD-10-CM

## 2022-05-31 DIAGNOSIS — Z7982 Long term (current) use of aspirin: Secondary | ICD-10-CM | POA: Diagnosis not present

## 2022-05-31 DIAGNOSIS — H6123 Impacted cerumen, bilateral: Secondary | ICD-10-CM | POA: Insufficient documentation

## 2022-05-31 DIAGNOSIS — Z7984 Long term (current) use of oral hypoglycemic drugs: Secondary | ICD-10-CM | POA: Diagnosis not present

## 2022-05-31 DIAGNOSIS — S0093XA Contusion of unspecified part of head, initial encounter: Secondary | ICD-10-CM | POA: Diagnosis not present

## 2022-05-31 DIAGNOSIS — R519 Headache, unspecified: Secondary | ICD-10-CM | POA: Insufficient documentation

## 2022-05-31 DIAGNOSIS — Z043 Encounter for examination and observation following other accident: Secondary | ICD-10-CM | POA: Diagnosis not present

## 2022-05-31 DIAGNOSIS — S41111A Laceration without foreign body of right upper arm, initial encounter: Secondary | ICD-10-CM | POA: Insufficient documentation

## 2022-05-31 DIAGNOSIS — M79605 Pain in left leg: Secondary | ICD-10-CM | POA: Diagnosis not present

## 2022-05-31 DIAGNOSIS — N3000 Acute cystitis without hematuria: Secondary | ICD-10-CM

## 2022-05-31 LAB — URINALYSIS, ROUTINE W REFLEX MICROSCOPIC
Bilirubin Urine: NEGATIVE
Glucose, UA: >=500 mg/dL — AB
Hgb urine dipstick: NEGATIVE
Ketones, ur: 20 mg/dL — AB
Leukocytes,Ua: NEGATIVE
Nitrite: POSITIVE — AB
Protein, ur: NEGATIVE mg/dL
Specific Gravity, Urine: 1.025 (ref 1.005–1.030)
pH: 5 (ref 5.0–8.0)

## 2022-05-31 LAB — CBC WITH DIFFERENTIAL/PLATELET
Abs Immature Granulocytes: 0 10*3/uL (ref 0.00–0.07)
Basophils Absolute: 0 10*3/uL (ref 0.0–0.1)
Basophils Relative: 1 %
Eosinophils Absolute: 0.3 10*3/uL (ref 0.0–0.5)
Eosinophils Relative: 6 %
HCT: 33.4 % — ABNORMAL LOW (ref 36.0–46.0)
Hemoglobin: 10.7 g/dL — ABNORMAL LOW (ref 12.0–15.0)
Immature Granulocytes: 0 %
Lymphocytes Relative: 43 %
Lymphs Abs: 1.9 10*3/uL (ref 0.7–4.0)
MCH: 29.7 pg (ref 26.0–34.0)
MCHC: 32 g/dL (ref 30.0–36.0)
MCV: 92.8 fL (ref 80.0–100.0)
Monocytes Absolute: 0.3 10*3/uL (ref 0.1–1.0)
Monocytes Relative: 6 %
Neutro Abs: 2 10*3/uL (ref 1.7–7.7)
Neutrophils Relative %: 44 %
Platelets: 189 10*3/uL (ref 150–400)
RBC: 3.6 MIL/uL — ABNORMAL LOW (ref 3.87–5.11)
RDW: 13.3 % (ref 11.5–15.5)
WBC: 4.5 10*3/uL (ref 4.0–10.5)
nRBC: 0 % (ref 0.0–0.2)

## 2022-05-31 LAB — BASIC METABOLIC PANEL
Anion gap: 7 (ref 5–15)
BUN: 18 mg/dL (ref 8–23)
CO2: 27 mmol/L (ref 22–32)
Calcium: 9.3 mg/dL (ref 8.9–10.3)
Chloride: 104 mmol/L (ref 98–111)
Creatinine, Ser: 0.89 mg/dL (ref 0.44–1.00)
GFR, Estimated: >60 mL/min (ref >60)
Glucose, Bld: 141 mg/dL — ABNORMAL HIGH (ref 70–99)
Potassium: 4.1 mmol/L (ref 3.5–5.1)
Sodium: 138 mmol/L (ref 135–145)

## 2022-05-31 MED ORDER — ACETAMINOPHEN 500 MG PO TABS
1000.0000 mg | ORAL_TABLET | Freq: Once | ORAL | Status: DC
  Filled 2022-05-31: qty 2

## 2022-05-31 MED ORDER — LIDOCAINE-EPINEPHRINE-TETRACAINE (LET) TOPICAL GEL
3.0000 mL | Freq: Once | TOPICAL | Status: AC
  Administered 2022-05-31: 3 mL via TOPICAL
  Filled 2022-05-31: qty 3

## 2022-05-31 MED ORDER — CEPHALEXIN 500 MG PO CAPS: 500.0000 mg | ORAL_CAPSULE | Freq: Two times a day (BID) | ORAL | 0 refills | Status: AC

## 2022-05-31 NOTE — ED Provider Notes (Signed)
[place] EMERGENCY DEPARTMENT AT [facility] Provider Note   CSN: [id number] Arrival date & time: 05/31/22  7829     History  Chief Complaint  Patient presents with   Marletta Lor    Anna Robbins is a 89 y.o. female.  PMH of stroke with some residual right sided weakness, history of diabetes.  Presents the ER complaining of a fall yesterday.  She states she bent over without using anything to hold on and lost her balance.  She fell down over her trunk in the bedroom and struck the right side of her ribs on the trunk and struck her bridge of her nose and forehead on a TV tray.  She denies loss of consciousness.  She takes baby aspirin but no other anticoagulants.  She states she is able to get up and get the bed last night after this happened at 5 PM.  When she woke up this morning she was very sore all over having most of the pain in her forehead and the bridge of her nose, also noticed a skin tear to the right elbow, pain and bruising to the dorsum of the right hand over the second metacarpal, some mild right rib pain and left or right lateral leg pain with bruising.  She had to have assistance to stand, presents to ED for further evaluation.  She also notes she has been having some urinary symptoms for the past week, they got better over the past 2 days but is still like to be evaluated for this.  She reports she has been having some dizziness for the past week as well but this seems to be resolved.   Fall Pertinent negatives include no chest pain and no shortness of breath.       Home Medications Prior to Admission medications   Medication Sig Start Date End Date Taking? Authorizing Provider  ABRYSVO 120 MCG/0.5ML injection  11/10/21   [provider]  acetaminophen (TYLENOL) 500 MG tablet Take 500-1,000 mg by mouth every 6 (six) hours as needed.    [provider]  albuterol (VENTOLIN HFA) 108 (90 Base) MCG/ACT inhaler Inhale into the lungs every 6 (six)  hours as needed for wheezing or shortness of breath.    [provider]  amLODipine (NORVASC) 5 MG tablet Take 1 tablet (5 mg total) by mouth daily. 07/24/21   Donell Beers, FNP  aspirin EC 81 MG tablet Take 1 tablet (81 mg total) by mouth daily. Swallow whole. 02/20/20   [name], MD  carbamide peroxide (DEBROX) 6.5 % OTIC solution Place 5 drops into both ears 2 (two) times daily. 08/22/21   Donell Beers, FNP  DULoxetine (CYMBALTA) 20 MG capsule TAKE 1 CAPSULE(20 MG) BY MOUTH DAILY 04/30/22   Billie Lade, MD  famotidine (PEPCID) 20 MG tablet TAKE 1 TABLET BY MOUTH AFTER SUPPER 12/12/21   Nyoka Cowden, MD  FARXIGA 5 MG TABS tablet TAKE 1 TABLET(5 MG) BY MOUTH DAILY BEFORE BREAKFAST 04/30/22   Billie Lade, MD  fluticasone (FLONASE) 50 MCG/ACT nasal spray Place 2 sprays into both nostrils daily. 07/24/21   Donell Beers, FNP  gabapentin (NEURONTIN) 300 MG capsule TAKE 2 CAPSULES(600 MG) BY MOUTH AT BEDTIME 04/30/22   Billie Lade, MD  hydrocortisone (ANUSOL-HC) 25 MG suppository Place 1 suppository (25 mg total) rectally 2 (two) times daily. 10/25/20   Heather Roberts, NP  ketoconazole (NIZORAL) 2 % shampoo Apply 1 Application topically 2 (two)  times a week. 12/25/21   Billie Lade, MD  Ketoconazole-Hydrocortisone 2-2.5 % CREA Apply 1 application topically daily. 04/08/20   Anabel Halon, MD  levETIRAcetam (KEPPRA) 500 MG tablet Take 1 tablet (500 mg total) by mouth 2 (two) times daily. 09/23/21   Ihor Austin, NP  metFORMIN (GLUCOPHAGE) 1000 MG tablet TAKE 1 TABLET(1000 MG) BY MOUTH TWICE DAILY WITH A MEAL 04/15/22   Billie Lade, MD  montelukast (SINGULAIR) 10 MG tablet TAKE 1 TABLET(10 MG) BY MOUTH AT BEDTIME 04/30/22   Billie Lade, MD  nystatin (MYCOSTATIN/NYSTOP) powder Apply 1 application topically 3 (three) times daily. 10/25/20   Heather Roberts, NP  pantoprazole (PROTONIX) 40 MG tablet TAKE 1 TABLET(40 MG) BY MOUTH DAILY 30 TO 60 MINUTES  BEFORE FIRST MEAL OF THE DAY 03/13/21   Nyoka Cowden, MD  simvastatin (ZOCOR) 10 MG tablet Take 1 tablet (10 mg total) by mouth at bedtime. 03/20/22   Billie Lade, MD  Vibegron (GEMTESA) 75 MG TABS Take 1 tablet by mouth daily. 03/25/20   Anabel Halon, MD  Vitamin D, Ergocalciferol, (DRISDOL) 1.25 MG (50000 UNIT) CAPS capsule Take 1 capsule (50,000 Units total) by mouth every 7 (seven) days for 12 doses. 03/24/22 06/10/22  Billie Lade, MD      Allergies    Asa [aspirin], Diphenhydramine hcl, Lisinopril, Melatonin, Molds & smuts, Nsaids, Other, Penicillin g, Penicillins, and Tramadol    Review of Systems   Review of Systems  Constitutional:  Negative for chills and fever.  Respiratory:  Negative for chest tightness and shortness of breath.   Cardiovascular:  Negative for chest pain and leg swelling.  Genitourinary:  Positive for dysuria.  Neurological:  Negative for syncope.    Physical Exam Updated Vital Signs BP (!) 174/71   Pulse 75   Temp 98 F (36.7 C)   Resp 17   Ht 5\' 6"  (1.676 m)   Wt 90.7 kg   SpO2 96%   BMI 32.28 kg/m  Physical Exam Vitals and nursing note reviewed.  Constitutional:      General: She is not in acute distress.    Appearance: She is well-developed.  HENT:     Head: Normocephalic and atraumatic.     Comments: Closest to the bridge of the nose and bilateral infraorbital area with swelling and tenderness to forehead    Right Ear: There is impacted cerumen. No mastoid tenderness.     Left Ear: There is impacted cerumen. No mastoid tenderness.  Eyes:     Conjunctiva/sclera: Conjunctivae normal.  Cardiovascular:     Rate and Rhythm: Normal rate and regular rhythm.     Heart sounds: No murmur heard. Pulmonary:     Effort: Pulmonary effort is normal. No respiratory distress.     Breath sounds: Normal breath sounds.  Chest:     Chest wall: No crepitus.    Abdominal:     Palpations: Abdomen is soft.     Tenderness: There is no abdominal  tenderness.  Musculoskeletal:        General: Normal range of motion.     Right elbow: Laceration present. Normal range of motion.     Left elbow: Normal.     Right wrist: Normal.     Left wrist: Normal.     Right hand: Bony tenderness present. Normal range of motion. Normal strength. Normal sensation. Normal capillary refill.     Left hand: Normal.       Arms:  Cervical back: Normal and neck supple.     Thoracic back: Normal.     Lumbar back: Normal.     Left lower leg: Tenderness present.     Right foot: Normal capillary refill. Normal pulse.     Left foot: Normal capillary refill. Normal pulse.       Legs:  Skin:    General: Skin is warm and dry.     Capillary Refill: Capillary refill takes less than 2 seconds.  Neurological:     Mental Status: She is alert.  Psychiatric:        Mood and Affect: Mood normal.     ED Results / Procedures / Treatments   Labs (all labs ordered are listed, but only abnormal results are displayed) Labs Reviewed  URINALYSIS, ROUTINE W REFLEX MICROSCOPIC    EKG None  Radiology No results found.  Procedures .Marland KitchenLaceration Repair  Date/Time: 05/31/2022 2:56 PM  Performed by: Ma Rings, PA-C Authorized by: Ma Rings, PA-C   Consent:    Consent obtained:  Verbal   Consent given by:  Patient   Risks discussed:  Pain and infection   Alternatives discussed:  No treatment Universal protocol:    Patient identity confirmed:  Verbally with patient Anesthesia:    Anesthesia method:  Topical application   Topical anesthetic:  LET Laceration details:    Location:  Shoulder/arm   Shoulder/arm location:  R elbow   Length (cm):  4 Pre-procedure details:    Preparation:  Imaging obtained to evaluate for foreign bodies Exploration:    Hemostasis achieved with:  Direct pressure   Imaging obtained: x-ray     Imaging outcome: foreign body not noted     Contaminated: no   Treatment:    Area cleansed with:  Saline and  chlorhexidine   Amount of cleaning:  Standard   Irrigation solution:  Sterile saline   Irrigation method:  Syringe   Visualized foreign bodies/material removed: no     Debridement:  None Skin repair:    Repair method:  Steri-Strips   Number of Steri-Strips:  4 Approximation:    Approximation:  Loose Repair type:    Repair type:  Simple Post-procedure details:    Dressing:  Non-adherent dressing   Procedure completion:  Tolerated well, no immediate complications Comments:     Skin was approximated as best as possible     Medications Ordered in ED Medications  acetaminophen (TYLENOL) tablet 1,000 mg (has no administration in time range)  lidocaine-EPINEPHrine-tetracaine (LET) topical gel (has no administration in time range)    ED Course/ Medical Decision Making/ A&P                             Medical Decision Making This patient presents to the ED for concern of, bruise to the forehead, bruising to bilateral thighs, skin tear to right arm, left leg pain and right hand pain, this involves an extensive number of treatment options, and is a complaint that carries with it a high risk of complications and morbidity.  The differential diagnosis includes hemorrhage, skull fracture, nasal bone fracture, laceration, skin tear, sprain, strain, fracture, other      Additional history obtained:  Additional history obtained from EMR External records from outside source obtained and reviewed including labs and urine cultures   Lab Tests:  I Ordered, and personally interpreted labs.  The pertinent results include: CBC and BMP are reassuring, globin of 10.7  is roughly patient's baseline.  She does have UTI.   Imaging Studies ordered:  I ordered imaging studies including CT head C-spine and maxillofacial no acute fractures or intracranial hemorrhage, plain films right elbow, right hand and left tib-fib no fracture or dislocation  I agree with the radiologist  interpretation     Problem List / ED Course / Critical interventions / Medication management Patient presents with multiple injuries after a fall yesterday, she has a skin tear to the right elbow.  Tetanus is up-to-date, I approximated the tissue as best as possible and applied Steri-Strips to help facilitate healing, discussed wound care and close follow-up for this.  She is given Tylenol and states her pain is improved.  She does require some assistance with walking but states she feels comfortable going home, she has a walker available at home and was instructed to use it.  She is concerned about dysuria symptoms for several days that seem to be somewhat improving but she does have positive nitrite and many bacteria on her urinalysis with 6-10 white blood cells.  Given symptoms we will treat with antibiotics.  She has history of penicillin allergy but states she is tolerated cephalexin in the past without difficulty.  Her urine sent for culture advised to follow with her primary care doctor.  She was given strict return precautions. I ordered medication including tylenol for pain Reevaluation of the patient after these medicines showed that the patient improved I have reviewed the patients home medicines and have made adjustments as needed      This chart has been completed using Engineer, civil (consulting) software, and while attempts have been made to ensure accuracy, certain words and phrases may not be transcribed as intended.    Amount and/or Complexity of Data Reviewed Labs: ordered. Radiology: ordered.  Risk OTC drugs. Prescription drug management.  Presents with multiple injuries after fall, injury         Final Clinical Impression(s) / ED Diagnoses Final diagnoses:  None    Rx / DC Orders ED Discharge Orders     None         Ma Rings, PA-C 05/31/22 1503    Cathren Laine, MD 05/31/22 [phone]

## 2022-05-31 NOTE — ED Triage Notes (Signed)
Pt lives with family and fell yesterday with walker. Pt takes ASA daily. Pt fell onto her face and has pain to her right hand/arm. Bruising , discoloration noted to eyes, forehead and right arm. Pt verbalized skin tear to right arm.

## 2022-05-31 NOTE — Discharge Instructions (Signed)
You are seen today after a fall, all of your x-rays were reassuring.  There are no fractures, no bleeding in your brain, you do have a UTI, urine was sent for culture and we are starting you on antibiotics.  Follow-up closely with her primary care doctor, come back to the ER for new or worsening symptoms.  Is her walker at home until your pain is better and you are able to walk without 1.  Continue using over-the-counter medication as needed for any discomfort.

## 2022-06-02 LAB — URINE CULTURE: Culture: >=100000 — AB

## 2022-06-03 ENCOUNTER — Telehealth (HOSPITAL_BASED_OUTPATIENT_CLINIC_OR_DEPARTMENT_OTHER): Payer: Self-pay | Admitting: *Deleted

## 2022-06-03 NOTE — Telephone Encounter (Signed)
Post ED Visit - Positive Culture Follow-up  Culture report reviewed by antimicrobial stewardship pharmacist: [facility] [x]  Gildardo Pounds, Pharm.D. []  Celedonio Miyamoto, Pharm.D., BCPS AQ-ID []  Garvin Fila, Pharm.D., BCPS []  Georgina Pillion, Pharm.D., BCPS []  [place], [address].D., BCPS, AAHIVP []  Estella Husk, Pharm.D., BCPS, AAHIVP []  Lysle Pearl, PharmD, BCPS []  Phillips Climes, PharmD, BCPS []  Agapito Games, PharmD, BCPS []  Verlan Friends, PharmD []  Mervyn Gay, PharmD, BCPS []  Vinnie Level, PharmD  [facility] []  Len Childs, PharmD []  Greer Pickerel, PharmD []  Adalberto Cole, PharmD []  Perlie Gold, Rph []  Lonell Face) Jean Rosenthal, PharmD []  Earl Many, PharmD []  Junita Push, PharmD []  Dorna Leitz, PharmD []  Terrilee Files, PharmD []  Lynann Beaver, PharmD []  Keturah Barre, PharmD []  Loralee Pacas, PharmD []  Bernadene Person, PharmD   Positive urine culture Treated with Cephalexin, organism sensitive to the same and no further patient follow-up is required at this time.  Virl Axe [facility] 06/03/2022, 2:21 PM

## 2022-06-12 ENCOUNTER — Encounter: Payer: Self-pay | Admitting: Internal Medicine

## 2022-06-13 ENCOUNTER — Other Ambulatory Visit: Payer: Self-pay | Admitting: Internal Medicine

## 2022-06-13 DIAGNOSIS — E559 Vitamin D deficiency, unspecified: Secondary | ICD-10-CM

## 2022-06-15 ENCOUNTER — Telehealth: Payer: Self-pay | Admitting: *Deleted

## 2022-06-15 NOTE — Progress Notes (Signed)
  Care Coordination Note  06/15/2022 Name: Anna Robbins MRN: [id number] DOB: 1932-05-24  Anna Robbins is a 89 y.o. year old female who is a primary care patient of Billie Lade, MD and is actively engaged with the care management team. I reached out to Rinaldo Ratel by phone today to assist with re-scheduling a follow up visit with the Licensed Clinical Social Worker  Follow up plan: Telephone appointment with care management team member scheduled for:09/14/22 If patient returns call to provider office, please advise to call Care Guide Misty Stanley at [phone]  [facility] Coordination Care Guide  Direct Dial: [phone]

## 2022-06-16 ENCOUNTER — Encounter: Payer: Medicare Other | Admitting: Licensed Clinical Social Worker

## 2022-06-25 ENCOUNTER — Ambulatory Visit (INDEPENDENT_AMBULATORY_CARE_PROVIDER_SITE_OTHER): Payer: Medicare Other | Admitting: Internal Medicine

## 2022-06-25 ENCOUNTER — Encounter: Payer: Self-pay | Admitting: Internal Medicine

## 2022-06-25 VITALS — BP 157/81 | HR 85 | Ht 65.0 in | Wt 202.0 lb

## 2022-06-25 DIAGNOSIS — Z87440 Personal history of urinary (tract) infections: Secondary | ICD-10-CM | POA: Diagnosis not present

## 2022-06-25 DIAGNOSIS — Z7984 Long term (current) use of oral hypoglycemic drugs: Secondary | ICD-10-CM | POA: Diagnosis not present

## 2022-06-25 DIAGNOSIS — M546 Pain in thoracic spine: Secondary | ICD-10-CM | POA: Diagnosis not present

## 2022-06-25 DIAGNOSIS — E1165 Type 2 diabetes mellitus with hyperglycemia: Secondary | ICD-10-CM

## 2022-06-25 DIAGNOSIS — E559 Vitamin D deficiency, unspecified: Secondary | ICD-10-CM | POA: Diagnosis not present

## 2022-06-25 DIAGNOSIS — Z9181 History of falling: Secondary | ICD-10-CM

## 2022-06-25 DIAGNOSIS — I10 Essential (primary) hypertension: Secondary | ICD-10-CM | POA: Diagnosis not present

## 2022-06-25 DIAGNOSIS — R42 Dizziness and giddiness: Secondary | ICD-10-CM

## 2022-06-25 DIAGNOSIS — M545 Low back pain, unspecified: Secondary | ICD-10-CM | POA: Diagnosis not present

## 2022-06-25 MED ORDER — LOSARTAN POTASSIUM 25 MG PO TABS
12.5000 mg | ORAL_TABLET | Freq: Every day | ORAL | 2 refills | Status: AC
Start: 2022-06-25 — End: 2022-09-23

## 2022-06-25 MED ORDER — OXYCODONE HCL 5 MG PO TABS
5.0000 mg | ORAL_TABLET | ORAL | 0 refills | Status: DC | PRN
Start: 2022-06-25 — End: 2022-07-16

## 2022-06-25 MED ORDER — MECLIZINE HCL 25 MG PO TABS
25.0000 mg | ORAL_TABLET | Freq: Three times a day (TID) | ORAL | 0 refills | Status: AC | PRN
Start: 2022-06-25 — End: ?

## 2022-06-25 NOTE — Progress Notes (Signed)
Established Patient Office Visit  Subjective   Patient ID: Anna Robbins, female    DOB: [birth year]  Age: 89 y.o. MRN: [id number]  Chief Complaint  Patient presents with   Fall    Follow up from recent fall, hurting all over    Anna Robbins returns to care today for follow-up in the setting of a recent fall.  ED presentation 5/5 after falling on 5/4.  Per previous documentation, she fell while bending over to feed her dogs, falling forward and hitting her head on a TV stand.  Diffuse ecchymoses and a skin tear on her right arm noted on exam.  She was additionally treated for UTI.  There have been no acute events since ER presentation earlier this month.  Anna Robbins says she feels okay today.  Overall her pain and soreness are improving.  Soreness is most significant in the thoracolumbar region of her back.  She still has a knot on her forehead above her right eye where she felt.  She states that this is gradually improving.  Anna Robbins endorses periodic dizziness.  This is not triggered by sudden changes in position or abrupt head movements, but rather occurs at random.  She states that this did not precipitate her fall.  Her additional concerns today are amlodipine, which she feels is contributing to lower extremity edema.  She is also concerned that [place] may be precipitating UTIs.  Of note, she was treated for UTI earlier this month.  She would like to stop [place] if not indicated.  Past Medical History:  Diagnosis Date   Asthma    Diabetes mellitus without complication (HCC)    Kidney stones 07/2017   required sx   Pulmonary emboli (HCC) 06/2017   Seizures (HCC)    Stroke [facility])    Past Surgical History:  Procedure Laterality Date   ABDOMINAL HYSTERECTOMY     APPENDECTOMY     thumb surgery     TONSILLECTOMY     Social History   Tobacco Use   Smoking status: Never   Smokeless tobacco: Never  Vaping Use   Vaping Use: Never used  Substance Use Topics   Alcohol use:  Not Currently   Drug use: Not Currently   History reviewed. No pertinent family history. Allergies  Allergen Reactions   Asa [Aspirin]    Diphenhydramine Hcl    Lisinopril     cough   Melatonin    Molds & Smuts    Nsaids    Other    Penicillin G    Penicillins    Tramadol    Review of Systems  Musculoskeletal:  Positive for back pain, falls and myalgias.  All other systems reviewed and are negative.     Objective:     BP (!) 157/81 (BP Location: Right Arm, Patient Position: Sitting, Cuff Size: Large)   Pulse 85   Ht 5\' 5"  (1.651 m)   Wt 202 lb (91.6 kg)   SpO2 94%   BMI 33.61 kg/m  BP Readings from Last 3 Encounters:  06/25/22 (!) 157/81  05/31/22 (!) 141/64  03/24/22 112/69   Physical Exam Vitals reviewed.  Constitutional:      General: She is not in acute distress.    Appearance: Normal appearance. She is not toxic-appearing.  HENT:     Head: Normocephalic and atraumatic.     Right Ear: External ear normal.     Left Ear: External ear normal.     Nose: Nose normal. No  congestion or rhinorrhea.     Mouth/Throat:     Mouth: Mucous membranes are moist.     Pharynx: Oropharynx is clear. No oropharyngeal exudate or posterior oropharyngeal erythema.  Eyes:     General: No scleral icterus.    Extraocular Movements: Extraocular movements intact.     Conjunctiva/sclera: Conjunctivae normal.     Pupils: Pupils are equal, round, and reactive to light.  Cardiovascular:     Rate and Rhythm: Normal rate and regular rhythm.     Pulses: Normal pulses.     Heart sounds: Normal heart sounds. No murmur heard.    No friction rub. No gallop.  Pulmonary:     Effort: Pulmonary effort is normal.     Breath sounds: Normal breath sounds. No wheezing, rhonchi or rales.  Abdominal:     General: Abdomen is flat. Bowel sounds are normal. There is no distension.     Palpations: Abdomen is soft.     Tenderness: There is no abdominal tenderness.  Musculoskeletal:        General:  Tenderness (TTP over the paraspinal muscles bilaterally in the thoracic and lumbar regions of the back) present. No swelling. Normal range of motion.     Cervical back: Normal range of motion.     Right lower leg: No edema.     Left lower leg: No edema.  Lymphadenopathy:     Cervical: No cervical adenopathy.  Skin:    General: Skin is warm and dry.     Capillary Refill: Capillary refill takes less than 2 seconds.     Coloration: Skin is not jaundiced.     Findings: Bruising (Right elbow, resolving) present.  Neurological:     General: No focal deficit present.     Mental Status: She is alert and oriented to person, place, and time.  Psychiatric:        Mood and Affect: Mood normal.        Behavior: Behavior normal.   Last CBC Lab Results  Component Value Date   WBC 4.5 05/31/2022   HGB 10.7 (L) 05/31/2022   HCT 33.4 (L) 05/31/2022   MCV 92.8 05/31/2022   MCH 29.7 05/31/2022   RDW 13.3 05/31/2022   PLT 189 05/31/2022   Last metabolic panel Lab Results  Component Value Date   GLUCOSE 141 (H) 05/31/2022   NA 138 05/31/2022   K 4.1 05/31/2022   CL 104 05/31/2022   CO2 27 05/31/2022   BUN 18 05/31/2022   CREATININE 0.89 05/31/2022   GFRNONAA >60 05/31/2022   CALCIUM 9.3 05/31/2022   PROT 6.5 03/19/2022   ALBUMIN 4.6 03/19/2022   LABGLOB 1.9 03/19/2022   AGRATIO 2.4 (H) 03/19/2022   BILITOT 0.3 03/19/2022   ALKPHOS 54 03/19/2022   AST 10 03/19/2022   ALT 8 03/19/2022   ANIONGAP 7 05/31/2022   Last lipids Lab Results  Component Value Date   CHOL 125 03/19/2022   HDL 57 03/19/2022   LDLCALC 54 03/19/2022   TRIG 68 03/19/2022   CHOLHDL 2.2 03/19/2022   Last hemoglobin A1c Lab Results  Component Value Date   HGBA1C 7.2 (H) 03/19/2022   Last thyroid functions Lab Results  Component Value Date   TSH 1.860 03/19/2022   Last vitamin D Lab Results  Component Value Date   VD25OH 46.0 06/25/2022    Assessment & Plan:   Problem List Items Addressed This  Visit       Essential hypertension - Primary  She is currently prescribed amlodipine 5 mg daily.  BP today is 157/81.  She endorses intermittent bilateral lower extremity edema, which she is aware is a potential side effect of amlodipine. -Discontinue amlodipine in favor of losartan 12.5 mg daily.      Type 2 diabetes mellitus with hyperglycemia, without long-term current use of insulin (HCC)    Most recent A1c 7.2.  Currently prescribed Farxiga 5 mg daily and metformin 1000 mg twice daily. -Through shared decision making, Marcelline Deist has been discontinued due to concern that is contributing to UTIs.      History of UTI    Recently treated for UTI with Keflex.  She has read that SGLT2 inhibitor such as Marcelline Deist can precipitate urinary tract infections.  She would like to discontinue this medication if not needed. -Through shared decision making, Marcelline Deist has been discontinued today      Vitamin D deficiency    Noted on previous labs.  She has completed high-dose, weekly vitamin D supplementation. -Repeat vitamin D level ordered today      History of recent fall    Presenting today for ER follow-up in the setting of a recent fall.  Imaging was negative for fracture or acute findings.  Pain and soreness are gradually resolving.  Fall is described as mechanical in nature, though she does endorse periodic dizziness today.  Her acute concern includes pain control and preventive measures to reduce his chances of falling again. -Recommend scheduled Tylenol every 6-8 hours for the next week.  I have also prescribed oxycodone 5 mg every 4 hours as needed for breakthrough pain x 10 tablets.  Through shared decision making, I have also placed referral to physical therapy as this will help with reducing persistent soreness as well as help to prevent future falls.      Dizziness    She endorses occasional dizziness.  This did not precipitate her recent fall.  Dizziness is not associated with abrupt  changes in position or sudden head movements.  She endorses a remote history of vertigo. -Trial meclizine as needed for relief of dizziness      Return in about 4 weeks (around 07/23/2022).   Billie Lade, MD

## 2022-06-25 NOTE — Patient Instructions (Signed)
It was a pleasure to see you today.  Thank you for giving [place] the opportunity to be involved in your care.  Below is a brief recap of your visit and next steps.  We will plan to see you again in 1 month.  Summary Physical therapy referral placed Start tylenol on scheduled basis for at least the next week Oxycodone 5 mg for breakthrough pain STOP amlodipine and Farxiga START losartan Trial meclizine for relief of dizziness Repeat Vitamin D level

## 2022-06-26 LAB — VITAMIN D 25 HYDROXY (VIT D DEFICIENCY, FRACTURES): Vit D, 25-Hydroxy: 46 ng/mL (ref 30.0–100.0)

## 2022-06-27 ENCOUNTER — Other Ambulatory Visit: Payer: Self-pay | Admitting: Nurse Practitioner

## 2022-06-27 DIAGNOSIS — J309 Allergic rhinitis, unspecified: Secondary | ICD-10-CM

## 2022-06-29 ENCOUNTER — Ambulatory Visit (HOSPITAL_COMMUNITY): Payer: Medicare Other | Attending: Cardiology

## 2022-06-29 ENCOUNTER — Telehealth: Payer: Self-pay | Admitting: *Deleted

## 2022-06-29 DIAGNOSIS — R011 Cardiac murmur, unspecified: Secondary | ICD-10-CM

## 2022-06-29 DIAGNOSIS — I350 Nonrheumatic aortic (valve) stenosis: Secondary | ICD-10-CM

## 2022-06-29 DIAGNOSIS — I351 Nonrheumatic aortic (valve) insufficiency: Secondary | ICD-10-CM

## 2022-06-29 LAB — ECHOCARDIOGRAM COMPLETE
AR max vel: 1.21 cm2
AV Area VTI: 1.21 cm2
AV Area mean vel: 1.1 cm2
AV Mean grad: 9.2 mmHg
AV Peak grad: 15.3 mmHg
Ao pk vel: 1.96 m/s
Area-P 1/2: 2.58 cm2
P 1/2 time: 455 msec
S' Lateral: 2.9 cm

## 2022-06-29 NOTE — Telephone Encounter (Signed)
-----   Message from Meriam Sprague, MD sent at 06/29/2022  5:00 PM EDT ----- Her echo shows normal pumping function. There is mild narrowing and leakiness of her aortic valve. We will continue to monitor this going forward with echoes every 1-2 years.

## 2022-06-29 NOTE — Telephone Encounter (Signed)
The patients friend [name] (on [place]) has been notified of the result and verbalized understanding.  All questions (if any) were answered.  [name] is aware that I will place the order for repeat echo in 1-2 years in the sytem and have our echo scheduler call her back to arrange this appt closer to that time.   [name] verbalized understanding and agrees with this plan.

## 2022-06-30 DIAGNOSIS — E113293 Type 2 diabetes mellitus with mild nonproliferative diabetic retinopathy without macular edema, bilateral: Secondary | ICD-10-CM | POA: Diagnosis not present

## 2022-06-30 DIAGNOSIS — H01001 Unspecified blepharitis right upper eyelid: Secondary | ICD-10-CM | POA: Diagnosis not present

## 2022-06-30 DIAGNOSIS — H353131 Nonexudative age-related macular degeneration, bilateral, early dry stage: Secondary | ICD-10-CM | POA: Diagnosis not present

## 2022-06-30 DIAGNOSIS — H01004 Unspecified blepharitis left upper eyelid: Secondary | ICD-10-CM | POA: Diagnosis not present

## 2022-06-30 DIAGNOSIS — H01002 Unspecified blepharitis right lower eyelid: Secondary | ICD-10-CM | POA: Diagnosis not present

## 2022-07-01 DIAGNOSIS — Z9181 History of falling: Secondary | ICD-10-CM | POA: Insufficient documentation

## 2022-07-01 DIAGNOSIS — Z87440 Personal history of urinary (tract) infections: Secondary | ICD-10-CM | POA: Insufficient documentation

## 2022-07-01 DIAGNOSIS — R42 Dizziness and giddiness: Secondary | ICD-10-CM | POA: Insufficient documentation

## 2022-07-01 NOTE — Assessment & Plan Note (Addendum)
Most recent A1c 7.2.  Currently prescribed Farxiga 5 mg daily and metformin 1000 mg twice daily. -Through shared decision making, Marcelline Deist has been discontinued due to concern that is contributing to UTIs.

## 2022-07-01 NOTE — Assessment & Plan Note (Signed)
She endorses occasional dizziness.  This did not precipitate her recent fall.  Dizziness is not associated with abrupt changes in position or sudden head movements.  She endorses a remote history of vertigo. -Trial meclizine as needed for relief of dizziness

## 2022-07-01 NOTE — Assessment & Plan Note (Signed)
Recently treated for UTI with Keflex.  She has read that SGLT2 inhibitor such as Anna Robbins can precipitate urinary tract infections.  She would like to discontinue this medication if not needed. -Through shared decision making, Anna Robbins has been discontinued today

## 2022-07-01 NOTE — Assessment & Plan Note (Signed)
She is currently prescribed amlodipine 5 mg daily.  BP today is 157/81.  She endorses intermittent bilateral lower extremity edema, which she is aware is a potential side effect of amlodipine. -Discontinue amlodipine in favor of losartan 12.5 mg daily.

## 2022-07-01 NOTE — Assessment & Plan Note (Signed)
Presenting today for ER follow-up in the setting of a recent fall.  Imaging was negative for fracture or acute findings.  Pain and soreness are gradually resolving.  Fall is described as mechanical in nature, though she does endorse periodic dizziness today.  Her acute concern includes pain control and preventive measures to reduce his chances of falling again. -Recommend scheduled Tylenol every 6-8 hours for the next week.  I have also prescribed oxycodone 5 mg every 4 hours as needed for breakthrough pain x 10 tablets.  Through shared decision making, I have also placed referral to physical therapy as this will help with reducing persistent soreness as well as help to prevent future falls.

## 2022-07-01 NOTE — Assessment & Plan Note (Signed)
Noted on previous labs.  She has completed high-dose, weekly vitamin D supplementation. -Repeat vitamin D level ordered today 

## 2022-07-14 ENCOUNTER — Emergency Department (HOSPITAL_COMMUNITY): Payer: Medicare Other

## 2022-07-14 ENCOUNTER — Inpatient Hospital Stay (HOSPITAL_COMMUNITY)
Admission: EM | Admit: 2022-07-14 | Discharge: 2022-07-16 | DRG: 065 | Disposition: A | Discharge status: Skilled Nursing Facility | Payer: Medicare Other | Attending: Family Medicine | Admitting: Family Medicine

## 2022-07-14 ENCOUNTER — Encounter: Payer: Self-pay | Admitting: Internal Medicine

## 2022-07-14 ENCOUNTER — Encounter (HOSPITAL_COMMUNITY): Payer: Self-pay | Admitting: *Deleted

## 2022-07-14 ENCOUNTER — Telehealth: Payer: Self-pay | Admitting: Internal Medicine

## 2022-07-14 ENCOUNTER — Other Ambulatory Visit: Payer: Self-pay

## 2022-07-14 ENCOUNTER — Observation Stay (HOSPITAL_COMMUNITY): Payer: Medicare Other

## 2022-07-14 DIAGNOSIS — I6930 Unspecified sequelae of cerebral infarction: Secondary | ICD-10-CM | POA: Diagnosis not present

## 2022-07-14 DIAGNOSIS — I69354 Hemiplegia and hemiparesis following cerebral infarction affecting left non-dominant side: Secondary | ICD-10-CM

## 2022-07-14 DIAGNOSIS — I6200 Nontraumatic subdural hemorrhage, unspecified: Secondary | ICD-10-CM | POA: Diagnosis not present

## 2022-07-14 DIAGNOSIS — I129 Hypertensive chronic kidney disease with stage 1 through stage 4 chronic kidney disease, or unspecified chronic kidney disease: Secondary | ICD-10-CM | POA: Diagnosis not present

## 2022-07-14 DIAGNOSIS — Z9181 History of falling: Secondary | ICD-10-CM

## 2022-07-14 DIAGNOSIS — Z7984 Long term (current) use of oral hypoglycemic drugs: Secondary | ICD-10-CM

## 2022-07-14 DIAGNOSIS — Z886 Allergy status to analgesic agent status: Secondary | ICD-10-CM

## 2022-07-14 DIAGNOSIS — S065XAA Traumatic subdural hemorrhage with loss of consciousness status unknown, initial encounter: Secondary | ICD-10-CM

## 2022-07-14 DIAGNOSIS — J45909 Unspecified asthma, uncomplicated: Secondary | ICD-10-CM | POA: Diagnosis not present

## 2022-07-14 DIAGNOSIS — Z888 Allergy status to other drugs, medicaments and biological substances status: Secondary | ICD-10-CM | POA: Diagnosis not present

## 2022-07-14 DIAGNOSIS — Z91048 Other nonmedicinal substance allergy status: Secondary | ICD-10-CM | POA: Diagnosis not present

## 2022-07-14 DIAGNOSIS — Z7982 Long term (current) use of aspirin: Secondary | ICD-10-CM

## 2022-07-14 DIAGNOSIS — G40909 Epilepsy, unspecified, not intractable, without status epilepticus: Secondary | ICD-10-CM | POA: Diagnosis not present

## 2022-07-14 DIAGNOSIS — L304 Erythema intertrigo: Secondary | ICD-10-CM

## 2022-07-14 DIAGNOSIS — E785 Hyperlipidemia, unspecified: Secondary | ICD-10-CM | POA: Diagnosis not present

## 2022-07-14 DIAGNOSIS — G8191 Hemiplegia, unspecified affecting right dominant side: Secondary | ICD-10-CM | POA: Diagnosis present

## 2022-07-14 DIAGNOSIS — I10 Essential (primary) hypertension: Secondary | ICD-10-CM | POA: Diagnosis not present

## 2022-07-14 DIAGNOSIS — W19XXXA Unspecified fall, initial encounter: Secondary | ICD-10-CM | POA: Diagnosis not present

## 2022-07-14 DIAGNOSIS — Z79899 Other long term (current) drug therapy: Secondary | ICD-10-CM | POA: Diagnosis not present

## 2022-07-14 DIAGNOSIS — R531 Weakness: Secondary | ICD-10-CM | POA: Diagnosis not present

## 2022-07-14 DIAGNOSIS — R739 Hyperglycemia, unspecified: Secondary | ICD-10-CM | POA: Diagnosis not present

## 2022-07-14 DIAGNOSIS — N1831 Chronic kidney disease, stage 3a: Secondary | ICD-10-CM | POA: Diagnosis not present

## 2022-07-14 DIAGNOSIS — Z66 Do not resuscitate: Secondary | ICD-10-CM | POA: Diagnosis not present

## 2022-07-14 DIAGNOSIS — G319 Degenerative disease of nervous system, unspecified: Secondary | ICD-10-CM | POA: Diagnosis not present

## 2022-07-14 DIAGNOSIS — Z86711 Personal history of pulmonary embolism: Secondary | ICD-10-CM | POA: Diagnosis not present

## 2022-07-14 DIAGNOSIS — F320 Major depressive disorder, single episode, mild: Secondary | ICD-10-CM | POA: Diagnosis present

## 2022-07-14 DIAGNOSIS — K649 Unspecified hemorrhoids: Secondary | ICD-10-CM

## 2022-07-14 DIAGNOSIS — E1165 Type 2 diabetes mellitus with hyperglycemia: Secondary | ICD-10-CM | POA: Diagnosis present

## 2022-07-14 DIAGNOSIS — E1122 Type 2 diabetes mellitus with diabetic chronic kidney disease: Secondary | ICD-10-CM | POA: Diagnosis not present

## 2022-07-14 DIAGNOSIS — Z743 Need for continuous supervision: Secondary | ICD-10-CM | POA: Diagnosis not present

## 2022-07-14 DIAGNOSIS — Z880 Allergy status to penicillin: Secondary | ICD-10-CM | POA: Diagnosis not present

## 2022-07-14 DIAGNOSIS — R6889 Other general symptoms and signs: Secondary | ICD-10-CM | POA: Diagnosis not present

## 2022-07-14 DIAGNOSIS — R911 Solitary pulmonary nodule: Secondary | ICD-10-CM | POA: Diagnosis not present

## 2022-07-14 LAB — CBC WITH DIFFERENTIAL/PLATELET
Abs Immature Granulocytes: 0.01 10*3/uL (ref 0.00–0.07)
Basophils Absolute: 0 10*3/uL (ref 0.0–0.1)
Basophils Relative: 1 %
Eosinophils Absolute: 0.3 10*3/uL (ref 0.0–0.5)
Eosinophils Relative: 5 %
HCT: 34.7 % — ABNORMAL LOW (ref 36.0–46.0)
Hemoglobin: 10.9 g/dL — ABNORMAL LOW (ref 12.0–15.0)
Immature Granulocytes: 0 %
Lymphocytes Relative: 30 %
Lymphs Abs: 1.5 10*3/uL (ref 0.7–4.0)
MCH: 29.3 pg (ref 26.0–34.0)
MCHC: 31.4 g/dL (ref 30.0–36.0)
MCV: 93.3 fL (ref 80.0–100.0)
Monocytes Absolute: 0.3 10*3/uL (ref 0.1–1.0)
Monocytes Relative: 6 %
Neutro Abs: 2.9 10*3/uL (ref 1.7–7.7)
Neutrophils Relative %: 58 %
Platelets: 244 10*3/uL (ref 150–400)
RBC: 3.72 MIL/uL — ABNORMAL LOW (ref 3.87–5.11)
RDW: 13.4 % (ref 11.5–15.5)
WBC: 5.1 10*3/uL (ref 4.0–10.5)
nRBC: 0 % (ref 0.0–0.2)

## 2022-07-14 LAB — COMPREHENSIVE METABOLIC PANEL
ALT: 10 U/L (ref 0–44)
AST: 13 U/L — ABNORMAL LOW (ref 15–41)
Albumin: 4 g/dL (ref 3.5–5.0)
Alkaline Phosphatase: 39 U/L (ref 38–126)
Anion gap: 9 (ref 5–15)
BUN: 25 mg/dL — ABNORMAL HIGH (ref 8–23)
CO2: 27 mmol/L (ref 22–32)
Calcium: 9.3 mg/dL (ref 8.9–10.3)
Chloride: 101 mmol/L (ref 98–111)
Creatinine, Ser: 1.01 mg/dL — ABNORMAL HIGH (ref 0.44–1.00)
GFR, Estimated: 54 mL/min — ABNORMAL LOW (ref >60)
Glucose, Bld: 237 mg/dL — ABNORMAL HIGH (ref 70–99)
Potassium: 4.3 mmol/L (ref 3.5–5.1)
Sodium: 137 mmol/L (ref 135–145)
Total Bilirubin: 0.7 mg/dL (ref 0.3–1.2)
Total Protein: 6.8 g/dL (ref 6.5–8.1)

## 2022-07-14 LAB — GLUCOSE, CAPILLARY: Glucose-Capillary: 156 mg/dL — ABNORMAL HIGH (ref 70–99)

## 2022-07-14 MED ORDER — POLYETHYLENE GLYCOL 3350 17 G PO PACK: 17.0000 g | PACK | Freq: Every day | ORAL | Status: DC | PRN

## 2022-07-14 MED ORDER — LEVETIRACETAM 500 MG PO TABS
500.0000 mg | ORAL_TABLET | Freq: Two times a day (BID) | ORAL | Status: DC
  Administered 2022-07-14 – 2022-07-16 (×4): 500 mg via ORAL
  Filled 2022-07-14 (×4): qty 1

## 2022-07-14 MED ORDER — ONDANSETRON HCL 4 MG PO TABS: 4.0000 mg | ORAL_TABLET | Freq: Four times a day (QID) | ORAL | Status: DC | PRN

## 2022-07-14 MED ORDER — ONDANSETRON HCL 4 MG/2ML IJ SOLN
4.0000 mg | Freq: Once | INTRAMUSCULAR | Status: DC
  Filled 2022-07-14: qty 2

## 2022-07-14 MED ORDER — ACETAMINOPHEN 325 MG PO TABS
650.0000 mg | ORAL_TABLET | Freq: Four times a day (QID) | ORAL | Status: DC | PRN
  Administered 2022-07-15: 650 mg via ORAL
  Filled 2022-07-14: qty 2

## 2022-07-14 MED ORDER — MONTELUKAST SODIUM 10 MG PO TABS
10.0000 mg | ORAL_TABLET | Freq: Every day | ORAL | Status: DC
  Administered 2022-07-14 – 2022-07-15 (×2): 10 mg via ORAL
  Filled 2022-07-14 (×2): qty 1

## 2022-07-14 MED ORDER — PANTOPRAZOLE SODIUM 40 MG PO TBEC
40.0000 mg | DELAYED_RELEASE_TABLET | Freq: Every day | ORAL | Status: DC
  Administered 2022-07-15 – 2022-07-16 (×2): 40 mg via ORAL
  Filled 2022-07-14 (×2): qty 1

## 2022-07-14 MED ORDER — INSULIN ASPART 100 UNIT/ML IJ SOLN: 0.0000 [IU] | Freq: Every day | INTRAMUSCULAR | Status: DC

## 2022-07-14 MED ORDER — LOSARTAN POTASSIUM 25 MG PO TABS
12.5000 mg | ORAL_TABLET | Freq: Every day | ORAL | Status: DC
  Administered 2022-07-15 – 2022-07-16 (×2): 12.5 mg via ORAL
  Filled 2022-07-14 (×2): qty 1

## 2022-07-14 MED ORDER — ACETAMINOPHEN 650 MG RE SUPP: 650.0000 mg | Freq: Four times a day (QID) | RECTAL | Status: DC | PRN

## 2022-07-14 MED ORDER — GABAPENTIN 300 MG PO CAPS
600.0000 mg | ORAL_CAPSULE | Freq: Every day | ORAL | Status: DC
  Administered 2022-07-14 – 2022-07-15 (×2): 600 mg via ORAL
  Filled 2022-07-14 (×2): qty 2

## 2022-07-14 MED ORDER — INSULIN ASPART 100 UNIT/ML IJ SOLN
0.0000 [IU] | Freq: Three times a day (TID) | INTRAMUSCULAR | Status: DC
  Administered 2022-07-15: 2 [IU] via SUBCUTANEOUS
  Administered 2022-07-15: 3 [IU] via SUBCUTANEOUS
  Administered 2022-07-16: 2 [IU] via SUBCUTANEOUS

## 2022-07-14 MED ORDER — DULOXETINE HCL 20 MG PO CPEP
20.0000 mg | ORAL_CAPSULE | Freq: Every day | ORAL | Status: DC
  Administered 2022-07-15 – 2022-07-16 (×2): 20 mg via ORAL
  Filled 2022-07-14 (×2): qty 1

## 2022-07-14 MED ORDER — OXYCODONE HCL 5 MG PO TABS: 5.0000 mg | ORAL_TABLET | Freq: Three times a day (TID) | ORAL | Status: DC | PRN

## 2022-07-14 MED ORDER — ONDANSETRON HCL 4 MG/2ML IJ SOLN: 4.0000 mg | Freq: Four times a day (QID) | INTRAMUSCULAR | Status: DC | PRN

## 2022-07-14 NOTE — ED Provider Notes (Signed)
[facility] Provider Note   CSN: [id number] Arrival date & time: 07/14/22  1015     History  Chief Complaint  Patient presents with   Weakness    Anna Robbins is a 89 y.o. female.  HPI Presents with daughter who assists with the history. Patient has multiple medical problems including diabetes, stroke.  She presents with weakness.  Daughter also notes the patient has new word finding difficulty and delay in speech.  No other focal weakness, but the patient notes that over the past 2 days in particular she has had inability to ambulate where she was doing this previously.  She has baseline left-sided lower extremity weakness, but now she has right-sided weakness as well thus cannot transfer even from bed to bedside commode. With inability do so, after speaking with her physician she was sent here for evaluation.  No fevers, chills. Patient recently completed a course of antibiotics for urinary tract infection.    Home Medications Prior to Admission medications   Medication Sig Start Date End Date Taking? Authorizing Provider  acetaminophen (TYLENOL) 500 MG tablet Take 500-1,000 mg by mouth every 6 (six) hours as needed.   Yes [provider]  albuterol (VENTOLIN HFA) 108 (90 Base) MCG/ACT inhaler Inhale into the lungs every 6 (six) hours as needed for wheezing or shortness of breath.   Yes [provider]  aspirin EC 81 MG tablet Take 1 tablet (81 mg total) by mouth daily. Swallow whole. 02/20/20  Yes [name], MD  DULoxetine (CYMBALTA) 20 MG capsule TAKE 1 CAPSULE(20 MG) BY MOUTH DAILY Patient taking differently: Take 20 mg by mouth daily. TAKE 1 CAPSULE(20 MG) BY MOUTH DAILY 04/30/22  Yes Billie Lade, MD  famotidine (PEPCID) 20 MG tablet TAKE 1 TABLET BY MOUTH AFTER SUPPER 12/12/21  Yes Nyoka Cowden, MD  fluticasone (FLONASE) 50 MCG/ACT nasal spray SHAKE LIQUID AND USE 2 SPRAYS IN EACH NOSTRIL DAILY  06/29/22  Yes Billie Lade, MD  gabapentin (NEURONTIN) 300 MG capsule TAKE 2 CAPSULES(600 MG) BY MOUTH AT BEDTIME Patient taking differently: Take 300-600 mg by mouth at bedtime. 04/30/22  Yes Billie Lade, MD  hydrocortisone (ANUSOL-HC) 25 MG suppository Place 1 suppository (25 mg total) rectally 2 (two) times daily. 10/25/20  Yes Heather Roberts, NP  ketoconazole (NIZORAL) 2 % shampoo Apply 1 Application topically 2 (two) times a week. 12/25/21  Yes Billie Lade, MD  Ketoconazole-Hydrocortisone 2-2.5 % CREA Apply 1 application topically daily. 04/08/20  Yes Anabel Halon, MD  levETIRAcetam (KEPPRA) 500 MG tablet Take 1 tablet (500 mg total) by mouth 2 (two) times daily. 09/23/21  Yes McCue, Shanda Bumps, NP  losartan (COZAAR) 25 MG tablet Take 0.5 tablets (12.5 mg total) by mouth daily. 06/25/22 09/23/22 Yes Billie Lade, MD  meclizine (ANTIVERT) 25 MG tablet Take 1 tablet (25 mg total) by mouth 3 (three) times daily as needed for dizziness. 06/25/22  Yes Billie Lade, MD  metFORMIN (GLUCOPHAGE) 1000 MG tablet TAKE 1 TABLET(1000 MG) BY MOUTH TWICE DAILY WITH A MEAL Patient taking differently: Take 1,000 mg by mouth 2 (two) times daily with a meal. 04/15/22  Yes Billie Lade, MD  montelukast (SINGULAIR) 10 MG tablet TAKE 1 TABLET(10 MG) BY MOUTH AT BEDTIME Patient taking differently: Take 10 mg by mouth at bedtime. 04/30/22  Yes Billie Lade, MD  oxyCODONE (ROXICODONE) 5 MG immediate release tablet Take 1 tablet (5 mg total)  by mouth every 4 (four) hours as needed for up to 10 doses for severe pain or breakthrough pain. 06/25/22  Yes Billie Lade, MD  pantoprazole (PROTONIX) 40 MG tablet TAKE 1 TABLET(40 MG) BY MOUTH DAILY 30 TO 60 MINUTES BEFORE FIRST MEAL OF THE DAY 03/13/21  Yes Nyoka Cowden, MD  simvastatin (ZOCOR) 10 MG tablet Take 1 tablet (10 mg total) by mouth at bedtime. 03/20/22  Yes Billie Lade, MD  vitamin E 180 MG (400 UNITS) capsule Take 400 Units by mouth daily.    Yes [provider]  ABRYSVO 120 MCG/0.5ML injection  11/10/21   [provider]      Allergies    Asa [aspirin], Diphenhydramine hcl, Lisinopril, Melatonin, Molds & smuts, Nsaids, Other, Penicillin g, Penicillins, and Tramadol    Review of Systems   Review of Systems  All other systems reviewed and are negative.   Physical Exam Updated Vital Signs BP (!) 169/85 (BP Location: Right Arm)   Pulse 71   Temp 98.2 F (36.8 C) (Oral)   Resp 17   Ht 5\' 5"  (1.651 m)   Wt 91.6 kg   SpO2 94%   BMI 33.61 kg/m  Physical Exam Vitals and nursing note reviewed.  Constitutional:      General: She is not in acute distress.    Appearance: She is well-developed.  HENT:     Head: Normocephalic and atraumatic.  Eyes:     Conjunctiva/sclera: Conjunctivae normal.  Cardiovascular:     Rate and Rhythm: Normal rate and regular rhythm.  Pulmonary:     Effort: Pulmonary effort is normal. No respiratory distress.     Breath sounds: Normal breath sounds. No stridor.  Abdominal:     General: There is no distension.  Skin:    General: Skin is warm and dry.  Neurological:     Mental Status: She is alert and oriented to person, place, and time.     Cranial Nerves: No cranial nerve deficit.     Motor: Atrophy present.     Comments: Speech is slow, but clear, no obvious word finding difficulties. Age-appropriate atrophy otherwise no obvious extremity weakness.  Psychiatric:        Mood and Affect: Mood normal.     ED Results / Procedures / Treatments   Labs (all labs ordered are listed, but only abnormal results are displayed) Labs Reviewed  COMPREHENSIVE METABOLIC PANEL - Abnormal; Notable for the following components:      Result Value   Glucose, Bld 237 (*)    BUN 25 (*)    Creatinine, Ser 1.01 (*)    AST 13 (*)    GFR, Estimated 54 (*)    All other components within normal limits  CBC WITH DIFFERENTIAL/PLATELET - Abnormal; Notable for the following components:    RBC 3.72 (*)    Hemoglobin 10.9 (*)    HCT 34.7 (*)    All other components within normal limits    EKG EKG Interpretation  Date/Time:  Tuesday July 14 2022 10:59:09 EDT Ventricular Rate:  74 PR Interval:  73 QRS Duration: 95 QT Interval:  394 QTC Calculation: 438 R Axis:   29 Text Interpretation: Sinus rhythm Short PR interval Abnormal R-wave progression, early transition Artifact in lead(s) I II III aVR aVL aVF V1 Confirmed by Gerhard Munch [phone]) on 07/14/2022 11:33:08 AM  Radiology MR Brain Wo Contrast (neuro protocol)  Result Date: 07/14/2022 CLINICAL DATA:  Neuro deficit, acute, stroke suspected. Weakness.  Fall. EXAM: MRI HEAD WITHOUT CONTRAST TECHNIQUE: Multiplanar, multiecho pulse sequences of the brain and surrounding structures were obtained without intravenous contrast. COMPARISON:  Head CT 05/31/2022 and MRI 03/06/2020 FINDINGS: Brain: Small subdural hematomas over both cerebral convexities measure up to 2 mm in thickness on the right and 3 mm on the left without mass effect. These were not evident on last month's CT. There are also small subdural hematomas in the posterior fossa measuring up to 2 mm in thickness on the right and 5 mm on the left without mass effect. No acute infarct, mass, or midline shift is evident. A small focus of cortical encephalomalacia is again noted in the parasagittal posterior right frontal lobe with associated chronic blood products, and there are also chronic blood products overlying the parasagittal left frontal lobe in this region as shown on the prior MRI. Mild cerebral atrophy is within normal limits for age. Cerebral white matter T2 hyperintensities are unchanged and nonspecific but compatible with minimal chronic small vessel ischemic disease. Vascular: Major intracranial vascular flow voids are preserved. Skull and upper cervical spine: No suspicious marrow lesion. Sinuses/Orbits: Bilateral cataract extraction. Paranasal sinuses and mastoid air  cells are clear. Other: None. Critical Value/emergent results were called by telephone at the time of interpretation on 07/14/2022 at 2:22 pm to Dr. Gerhard Munch, who verbally acknowledged these results. IMPRESSION: 1. Small bilateral subdural hematomas over the cerebral convexities and in the posterior fossa. No mass effect. 2. No acute infarct. 3. Small chronic right frontal infarct. Electronically Signed   By: Sebastian Ache M.D.   On: 07/14/2022 14:25   DG Chest 2 View  Result Date: 07/14/2022 CLINICAL DATA:  Weakness EXAM: CHEST - 2 VIEW COMPARISON:  X-ray 05/31/2022 FINDINGS: No consolidation, pneumothorax or effusion. No edema. Normal cardiopericardial silhouette. Overlapping cardiac leads. Calcified right midlung nodule identified consistent with old granulomatous disease. Stable sclerotic focus overlying the left humeral head. IMPRESSION: No acute cardiopulmonary disease. Electronically Signed   By: Karen Kays M.D.   On: 07/14/2022 12:15    Procedures Procedures    Medications Ordered in ED Medications  ondansetron (ZOFRAN) injection 4 mg (4 mg Intravenous Not Given 07/14/22 1358)    ED Course/ Medical Decision Making/ A&P                             Medical Decision Making Elderly female with risk profile including hyperlipidemia, hypertension, prior stroke presents with weakness, reported speech difficulty.  Broad differential including stroke versus electrolyte abnormalities or infection, less likely cardiac. Pulse ox 100% room air normal Cardiac 70 sinus normal MRI, x-ray, labs ordered.   Amount and/or Complexity of Data Reviewed Independent Historian: caregiver External Data Reviewed: notes. Labs: ordered. Decision-making details documented in ED Course. Radiology: ordered and independent interpretation performed. Decision-making details documented in ED Course. ECG/medicine tests: ordered and independent interpretation performed. Decision-making details documented in ED  Course.  Risk Prescription drug management.   3:04 PM Patient in similar condition. This adult female presents with gait difficulty, weakness in the past days. She is awake and alert, unable to bear weight, walk, suspicion for stroke versus electrolyte abnormalities versus progression of chronic weakness.  MRI consistent with bilateral subdural hematoma, no emergent need for surgery, per neurosurgery, Dr. Jake Samples.  Given patient's inability to perform previously unremarkable ADL, patient will be admitted for further monitoring, management, PT eval.     Final Clinical Impression(s) / ED Diagnoses Final diagnoses:  SDH (subdural hematoma) (HCC)  CRITICAL CARE Performed by: Gerhard Munch Total critical care time: 35 minutes Critical care time was exclusive of separately billable procedures and treating other patients. Critical care was necessary to treat or prevent imminent or life-threatening deterioration. Critical care was time spent personally by me on the following activities: development of treatment plan with patient and/or surrogate as well as nursing, discussions with consultants, evaluation of patient's response to treatment, examination of patient, obtaining history from patient or surrogate, ordering and performing treatments and interventions, ordering and review of laboratory studies, ordering and review of radiographic studies, pulse oximetry and re-evaluation of patient's condition.    Gerhard Munch, MD 07/14/22 [phone]

## 2022-07-14 NOTE — H&P (Signed)
History and Physical    Anna Robbins ZOX:[id number] DOB: Jan 08, 1933 DOA: 07/14/2022  PCP: Billie Lade, MD   Patient coming from: Home  I have personally briefly reviewed patient's old medical records in [facility]  Chief Complaint: Weakness, Delayed speech  HPI: Anna Robbins is a 89 y.o. female with medical history significant for hypertension, seizure, diabetes mellitus, PE, CKD 3, depression. Patient presented to the ED with complaints of weakness for the past 2 days, with delayed speech and word finding difficulty.  She reports at baseline she has weakness to her left lower extremity, for over the past few days she has noticed weakness to the right side. Per triage notes, patient had UTI and was treated for this, but per patient this was back in February and she was treated with Bactrim. Patient also had a significant fall in May for which she had evaluation including head scans, she has not had any falls since then.  She has had intermittent and unchanged headaches since that fall.  She is on aspirin, otherwise not on any blood thinners.  ED Course: Stable vitals.  Chest x-ray negative for acute abnormality.  MRI brain - shows small bilateral subdural hematomas over cerebral convexities. EDP talked to neurosurgeon Dr. Jake Samples, no emergent need for surgery, ok to admit here.  Review of Systems: As per HPI all other systems reviewed and negative.  Past Medical History:  Diagnosis Date   Asthma    Diabetes mellitus without complication (HCC)    Kidney stones 07/2017   required sx   Pulmonary emboli (HCC) 06/2017   Seizures (HCC)    Stroke [facility])     Past Surgical History:  Procedure Laterality Date   ABDOMINAL HYSTERECTOMY     APPENDECTOMY     thumb surgery     TONSILLECTOMY       reports that she has never smoked. She has never used smokeless tobacco. She reports that she does not currently use alcohol. She reports that she does not currently use  drugs.  Allergies  Allergen Reactions   Asa [Aspirin]    Diphenhydramine Hcl    Lisinopril     cough   Melatonin    Molds & Smuts    Nsaids    Other    Penicillin G    Penicillins    Tramadol    Family History of hypertension  Prior to Admission medications   Medication Sig Start Date End Date Taking? Authorizing Provider  acetaminophen (TYLENOL) 500 MG tablet Take 500-1,000 mg by mouth every 6 (six) hours as needed.   Yes [provider]  albuterol (VENTOLIN HFA) 108 (90 Base) MCG/ACT inhaler Inhale into the lungs every 6 (six) hours as needed for wheezing or shortness of breath.   Yes [provider]  aspirin EC 81 MG tablet Take 1 tablet (81 mg total) by mouth daily. Swallow whole. 02/20/20  Yes [name], MD  DULoxetine (CYMBALTA) 20 MG capsule TAKE 1 CAPSULE(20 MG) BY MOUTH DAILY Patient taking differently: Take 20 mg by mouth daily. TAKE 1 CAPSULE(20 MG) BY MOUTH DAILY 04/30/22  Yes Billie Lade, MD  famotidine (PEPCID) 20 MG tablet TAKE 1 TABLET BY MOUTH AFTER SUPPER 12/12/21  Yes Nyoka Cowden, MD  fluticasone (FLONASE) 50 MCG/ACT nasal spray SHAKE LIQUID AND USE 2 SPRAYS IN EACH NOSTRIL DAILY 06/29/22  Yes Billie Lade, MD  gabapentin (NEURONTIN) 300 MG capsule TAKE 2 CAPSULES(600 MG) BY MOUTH AT BEDTIME Patient taking differently:  Take 300-600 mg by mouth at bedtime. 04/30/22  Yes Billie Lade, MD  hydrocortisone (ANUSOL-HC) 25 MG suppository Place 1 suppository (25 mg total) rectally 2 (two) times daily. 10/25/20  Yes Heather Roberts, NP  ketoconazole (NIZORAL) 2 % shampoo Apply 1 Application topically 2 (two) times a week. 12/25/21  Yes Billie Lade, MD  Ketoconazole-Hydrocortisone 2-2.5 % CREA Apply 1 application topically daily. 04/08/20  Yes Anabel Halon, MD  levETIRAcetam (KEPPRA) 500 MG tablet Take 1 tablet (500 mg total) by mouth 2 (two) times daily. 09/23/21  Yes McCue, Shanda Bumps, NP  losartan (COZAAR) 25 MG tablet Take 0.5 tablets  (12.5 mg total) by mouth daily. 06/25/22 09/23/22 Yes Billie Lade, MD  meclizine (ANTIVERT) 25 MG tablet Take 1 tablet (25 mg total) by mouth 3 (three) times daily as needed for dizziness. 06/25/22  Yes Billie Lade, MD  metFORMIN (GLUCOPHAGE) 1000 MG tablet TAKE 1 TABLET(1000 MG) BY MOUTH TWICE DAILY WITH A MEAL Patient taking differently: Take 1,000 mg by mouth 2 (two) times daily with a meal. 04/15/22  Yes Billie Lade, MD  montelukast (SINGULAIR) 10 MG tablet TAKE 1 TABLET(10 MG) BY MOUTH AT BEDTIME Patient taking differently: Take 10 mg by mouth at bedtime. 04/30/22  Yes Billie Lade, MD  oxyCODONE (ROXICODONE) 5 MG immediate release tablet Take 1 tablet (5 mg total) by mouth every 4 (four) hours as needed for up to 10 doses for severe pain or breakthrough pain. 06/25/22  Yes Billie Lade, MD  pantoprazole (PROTONIX) 40 MG tablet TAKE 1 TABLET(40 MG) BY MOUTH DAILY 30 TO 60 MINUTES BEFORE FIRST MEAL OF THE DAY 03/13/21  Yes Nyoka Cowden, MD  simvastatin (ZOCOR) 10 MG tablet Take 1 tablet (10 mg total) by mouth at bedtime. 03/20/22  Yes Billie Lade, MD  vitamin E 180 MG (400 UNITS) capsule Take 400 Units by mouth daily.   Yes [provider]  ABRYSVO 120 MCG/0.5ML injection  11/10/21   [provider]    Physical Exam: Vitals:   07/14/22 1026 07/14/22 1030 07/14/22 1115 07/14/22 1145  BP: (!) 169/85     Pulse: 68  68 71  Resp: 10  15 17   Temp: 98.2 F (36.8 C)     TempSrc: Oral     SpO2: 95%  96% 94%  Weight:  91.6 kg    Height:  5\' 5"  (1.651 m)      Constitutional: NAD, calm, comfortable Vitals:   07/14/22 1026 07/14/22 1030 07/14/22 1115 07/14/22 1145  BP: (!) 169/85     Pulse: 68  68 71  Resp: 10  15 17   Temp: 98.2 F (36.8 C)     TempSrc: Oral     SpO2: 95%  96% 94%  Weight:  91.6 kg    Height:  5\' 5"  (1.651 m)     Eyes: PERRL, lids and conjunctivae normal ENMT: Mucous membranes are moist.   Neck: normal, supple, no masses, no  thyromegaly Respiratory: clear to auscultation bilaterally, no wheezing, no crackles. Normal respiratory effort. No accessory muscle use.  Cardiovascular: Regular rate and rhythm, no murmurs / rubs / gallops. No extremity edema.  Extremities warm Abdomen: no tenderness, no masses palpated. No hepatosplenomegaly. Bowel sounds positive.  Musculoskeletal: no clubbing / cyanosis. No joint deformity upper and lower extremities.  Skin: no rashes, lesions, ulcers. No induration Neurologic: No Facial asymmetry, speech clear and fluent without aphasia, 5 of 5 strength to bilateral upper  extremities, 4+/5 strength of bilateral lower extremities. Psychiatric: Normal judgment and insight. Alert and oriented x 3. Normal mood.   Labs on Admission: I have personally reviewed following labs and imaging studies  CBC: Recent Labs  Lab 07/14/22 1144  WBC 5.1  NEUTROABS 2.9  HGB 10.9*  HCT 34.7*  MCV 93.3  PLT 244   Basic Metabolic Panel: Recent Labs  Lab 07/14/22 1144  NA 137  K 4.3  CL 101  CO2 27  GLUCOSE 237*  BUN 25*  CREATININE 1.01*  CALCIUM 9.3   GFR: Estimated Creatinine Clearance: 43 mL/min (A) (by C-G formula based on SCr of 1.01 mg/dL (H)). Liver Function Tests: Recent Labs  Lab 07/14/22 1144  AST 13*  ALT 10  ALKPHOS 39  BILITOT 0.7  PROT 6.8  ALBUMIN 4.0   Radiological Exams on Admission: MR Brain Wo Contrast (neuro protocol)  Result Date: 07/14/2022 CLINICAL DATA:  Neuro deficit, acute, stroke suspected. Weakness. Fall. EXAM: MRI HEAD WITHOUT CONTRAST TECHNIQUE: Multiplanar, multiecho pulse sequences of the brain and surrounding structures were obtained without intravenous contrast. COMPARISON:  Head CT 05/31/2022 and MRI 03/06/2020 FINDINGS: Brain: Small subdural hematomas over both cerebral convexities measure up to 2 mm in thickness on the right and 3 mm on the left without mass effect. These were not evident on last month's CT. There are also small subdural hematomas  in the posterior fossa measuring up to 2 mm in thickness on the right and 5 mm on the left without mass effect. No acute infarct, mass, or midline shift is evident. A small focus of cortical encephalomalacia is again noted in the parasagittal posterior right frontal lobe with associated chronic blood products, and there are also chronic blood products overlying the parasagittal left frontal lobe in this region as shown on the prior MRI. Mild cerebral atrophy is within normal limits for age. Cerebral white matter T2 hyperintensities are unchanged and nonspecific but compatible with minimal chronic small vessel ischemic disease. Vascular: Major intracranial vascular flow voids are preserved. Skull and upper cervical spine: No suspicious marrow lesion. Sinuses/Orbits: Bilateral cataract extraction. Paranasal sinuses and mastoid air cells are clear. Other: None. Critical Value/emergent results were called by telephone at the time of interpretation on 07/14/2022 at 2:22 pm to Dr. Gerhard Munch, who verbally acknowledged these results. IMPRESSION: 1. Small bilateral subdural hematomas over the cerebral convexities and in the posterior fossa. No mass effect. 2. No acute infarct. 3. Small chronic right frontal infarct. Electronically Signed   By: Sebastian Ache M.D.   On: 07/14/2022 14:25   DG Chest 2 View  Result Date: 07/14/2022 CLINICAL DATA:  Weakness EXAM: CHEST - 2 VIEW COMPARISON:  X-ray 05/31/2022 FINDINGS: No consolidation, pneumothorax or effusion. No edema. Normal cardiopericardial silhouette. Overlapping cardiac leads. Calcified right midlung nodule identified consistent with old granulomatous disease. Stable sclerotic focus overlying the left humeral head. IMPRESSION: No acute cardiopulmonary disease. Electronically Signed   By: Karen Kays M.D.   On: 07/14/2022 12:15    EKG: Independently reviewed.  Sinus rhythm, rate 74, QTc 438.  No significant change from prior.  Assessment/Plan Principal  Problem:   Bilateral subdural hematomas (HCC) Active Problems:   History of CVA with residual deficit   Essential hypertension   Depression, major, single episode, mild (HCC)   Type 2 diabetes mellitus with hyperglycemia, without long-term current use of insulin (HCC)   Seizure disorder (HCC)   Assessment and Plan: * Bilateral subdural hematomas (HCC) Last fall with head trauma  was 06/15/2022.  Head CT at that time without acute abnormalities.  She has not had any subsequent falls.  She is on aspirin.  MRI brain today-  showing small bilateral subdural hematomas over the cerebral convexities.  Patient reports difficulty finding her words, with new right-sided weakness, on exam she has no significant neurologic deficits.  With equal strength on all extremities,and fluent speech. A & O X 4.  - EDP talked to neurosurgeon Dr. Jake Samples.,  No need for emergent surgery, okay to admit here. -I talked to the patient's and daughter-in-law Olegario Messier at bedside about admission to [facility], versus staying here at [facility], they have elected to stay here at [facility], with the understanding that if bleeding should worsen, we do not have neurosurgery here and patient will need to be transferred urgently - Repeat head CT in the morning - Neuro checks every 4 hourly -PT evaluation -Hold aspirin -Reports intermittent dysuria, check UA  Seizure disorder (HCC) Resume Keppra 500 twice daily  Type 2 diabetes mellitus with hyperglycemia, without long-term current use of insulin (HCC) A1c 7.2. - Hold Metformin - SSI- S  Depression, major, single episode, mild (HCC) Resume Cymbalta  Essential hypertension Stable. -Resume losartan,  History of CVA with residual deficit Reports residual left-sided deficits from prior stroke.  Today she has 4+5 strength strength in all extremities. Reports generalized weakness.   DVT prophylaxis: SCDS Code Status: DNR- Confirmed with patient, and daughter-in-law Olegario Messier at  bedside.  ACP documents reviewed. Family Communication: Daughter-in-law- Olegario Messier at [employer]. Disposition Plan: ~ 2 days Consults called: None  Admission status: Obs tele     Author: Onnie Boer, MD 07/14/2022 6:53 PM  For on call review [email or web address].

## 2022-07-14 NOTE — ED Notes (Signed)
ED TO INPATIENT HANDOFF REPORT  ED Nurse Name and Phone #: Karis Juba [phone]  S Name/Age/Gender Anna Robbins 89 y.o. female Room/Bed: APA19/APA19  Code Status   Code Status: Not on file  Home/SNF/Other Home Patient oriented to: self, place, time, and situation Is this baseline? Yes   Triage Complete: Triage complete  Chief Complaint Bilateral subdural hematomas (HCC) [S06.5XAA]  Triage Note Pt BIB [facility] for c/o weakness and fall   Pt states she fell yesterday while trying to pick up her dog's bowl and fell landing on her head; pt denies any loc  Pt states she has been feeling weak x 2 days  BP 160/80 P 82 O2 95% RA Cbg 293   Allergies Allergies  Allergen Reactions   Asa [Aspirin]    Diphenhydramine Hcl    Lisinopril     cough   Melatonin    Molds & Smuts    Nsaids    Other    Penicillin G    Penicillins    Tramadol     Level of Care/Admitting Diagnosis ED Disposition     ED Disposition  Admit   Condition  --   Comment  Hospital Area: [facility] [[id number]]  Level of Care: Telemetry [5]  Covid Evaluation: Asymptomatic - no recent exposure (last 10 days) testing not required  Diagnosis: Bilateral subdural hematomas [facility]) [[id number]]  Admitting Physician: Onnie Boer [[id number]]  Attending Physician: Onnie Boer Xenia.Albin Duckett          B Medical/Surgery History Past Medical History:  Diagnosis Date   Asthma    Diabetes mellitus without complication (HCC)    Kidney stones 07/2017   required sx   Pulmonary emboli (HCC) 06/2017   Seizures (HCC)    Stroke [facility])    Past Surgical History:  Procedure Laterality Date   ABDOMINAL HYSTERECTOMY     APPENDECTOMY     thumb surgery     TONSILLECTOMY       A IV Location/Drains/Wounds Patient Lines/Drains/Airways Status     Active Line/Drains/Airways     Name Placement date Placement time Site Days   Peripheral IV 07/14/22 20 G 1" Left Antecubital 07/14/22  1142   Antecubital  less than 1            Intake/Output Last 24 hours No intake or output data in the 24 hours ending 07/14/22 1718  Labs/Imaging Results for orders placed or performed during the hospital encounter of 07/14/22 (from the past 48 hour(s))  Comprehensive metabolic panel     Status: Abnormal   Collection Time: 07/14/22 11:44 AM  Result Value Ref Range   Sodium 137 135 - 145 mmol/L   Potassium 4.3 3.5 - 5.1 mmol/L   Chloride 101 98 - 111 mmol/L   CO2 27 22 - 32 mmol/L   Glucose, Bld 237 (H) 70 - 99 mg/dL    Comment: Glucose reference range applies only to samples taken after fasting for at least 8 hours.   BUN 25 (H) 8 - 23 mg/dL   Creatinine, Ser 4.78 (H) 0.44 - 1.00 mg/dL   Calcium 9.3 8.9 - 29.5 mg/dL   Total Protein 6.8 6.5 - 8.1 g/dL   Albumin 4.0 3.5 - 5.0 g/dL   AST 13 (L) 15 - 41 U/L   ALT 10 0 - 44 U/L   Alkaline Phosphatase 39 38 - 126 U/L   Total Bilirubin 0.7 0.3 - 1.2 mg/dL   GFR, Estimated 54 (L) >60 mL/min  Comment: (NOTE) Calculated using the CKD-EPI Creatinine Equation (2021)    Anion gap 9 5 - 15    Comment: Performed at [facility], [address] [place], [place] [address]  CBC with Differential     Status: Abnormal   Collection Time: 07/14/22 11:44 AM  Result Value Ref Range   WBC 5.1 4.0 - 10.5 K/uL   RBC 3.72 (L) 3.87 - 5.11 MIL/uL   Hemoglobin 10.9 (L) 12.0 - 15.0 g/dL   HCT 60.4 (L) 54.0 - 98.1 %   MCV 93.3 80.0 - 100.0 fL   MCH 29.3 26.0 - 34.0 pg   MCHC 31.4 30.0 - 36.0 g/dL   RDW 19.1 47.8 - 29.5 %   Platelets 244 150 - 400 K/uL   nRBC 0.0 0.0 - 0.2 %   Neutrophils Relative % 58 %   Neutro Abs 2.9 1.7 - 7.7 K/uL   Lymphocytes Relative 30 %   Lymphs Abs 1.5 0.7 - 4.0 K/uL   Monocytes Relative 6 %   Monocytes Absolute 0.3 0.1 - 1.0 K/uL   Eosinophils Relative 5 %   Eosinophils Absolute 0.3 0.0 - 0.5 K/uL   Basophils Relative 1 %   Basophils Absolute 0.0 0.0 - 0.1 K/uL   Immature Granulocytes 0 %   Abs Immature  Granulocytes 0.01 0.00 - 0.07 K/uL    Comment: Performed at [facility], [address] [place], [place] [address]   MR Brain Wo Contrast (neuro protocol)  Result Date: 07/14/2022 CLINICAL DATA:  Neuro deficit, acute, stroke suspected. Weakness. Fall. EXAM: MRI HEAD WITHOUT CONTRAST TECHNIQUE: Multiplanar, multiecho pulse sequences of the brain and surrounding structures were obtained without intravenous contrast. COMPARISON:  Head CT 05/31/2022 and MRI 03/06/2020 FINDINGS: Brain: Small subdural hematomas over both cerebral convexities measure up to 2 mm in thickness on the right and 3 mm on the left without mass effect. These were not evident on last month's CT. There are also small subdural hematomas in the posterior fossa measuring up to 2 mm in thickness on the right and 5 mm on the left without mass effect. No acute infarct, mass, or midline shift is evident. A small focus of cortical encephalomalacia is again noted in the parasagittal posterior right frontal lobe with associated chronic blood products, and there are also chronic blood products overlying the parasagittal left frontal lobe in this region as shown on the prior MRI. Mild cerebral atrophy is within normal limits for age. Cerebral white matter T2 hyperintensities are unchanged and nonspecific but compatible with minimal chronic small vessel ischemic disease. Vascular: Major intracranial vascular flow voids are preserved. Skull and upper cervical spine: No suspicious marrow lesion. Sinuses/Orbits: Bilateral cataract extraction. Paranasal sinuses and mastoid air cells are clear. Other: None. Critical Value/emergent results were called by telephone at the time of interpretation on 07/14/2022 at 2:22 pm to Dr. Gerhard Munch, who verbally acknowledged these results. IMPRESSION: 1. Small bilateral subdural hematomas over the cerebral convexities and in the posterior fossa. No mass effect. 2. No acute infarct. 3. Small chronic right frontal infarct.  Electronically Signed   By: Sebastian Ache M.D.   On: 07/14/2022 14:25   DG Chest 2 View  Result Date: 07/14/2022 CLINICAL DATA:  Weakness EXAM: CHEST - 2 VIEW COMPARISON:  X-ray 05/31/2022 FINDINGS: No consolidation, pneumothorax or effusion. No edema. Normal cardiopericardial silhouette. Overlapping cardiac leads. Calcified right midlung nodule identified consistent with old granulomatous disease. Stable sclerotic focus overlying the left humeral head. IMPRESSION: No acute cardiopulmonary  disease. Electronically Signed   By: Karen Kays M.D.   On: 07/14/2022 12:15    Pending Labs Unresulted Labs (From admission, onward)    None       Vitals/Pain Today's Vitals   07/14/22 1030 07/14/22 1115 07/14/22 1145 07/14/22 1546  BP:    113/87  Pulse:  68 71   Resp:  15 17   Temp:    98.5 F (36.9 C)  TempSrc:    Oral  SpO2:  96% 94%   Weight: 91.6 kg     Height: 5\' 5"  (1.651 m)     PainSc: 9    6     Isolation Precautions No active isolations  Medications Medications  ondansetron (ZOFRAN) injection 4 mg (4 mg Intravenous Not Given 07/14/22 1358)    Mobility walks with person assist     Focused Assessments Neuro Assessment Handoff:     NIH Stroke Scale  Dizziness Present: Yes (sometimes over the past 10 days, she's felt some vertigo-like symptoms) Headache Present: No Interval: Shift assessment Level of Consciousness (1a.)   : Alert, keenly responsive LOC Questions (1b. )   : Answers both questions correctly LOC Commands (1c. )   : Performs both tasks correctly Best Gaze (2. )  : Normal Visual (3. )  : No visual loss Facial Palsy (4. )    : Normal symmetrical movements Motor Arm, Left (5a. )   : No drift Motor Arm, Right (5b. ) : No drift Motor Leg, Left (6a. )  : No movement Motor Leg, Right (6b. ) : No drift Limb Ataxia (7. ): Absent Sensory (8. )  : Normal, no sensory loss Best Language (9. )  : No aphasia Dysarthria (10. ): Normal Extinction/Inattention (11.)    : No Abnormality Complete NIHSS TOTAL: 4 Last date known well: 07/12/22   Neuro Assessment:   Neuro Checks:   Shift assessment (07/14/22 1547)  Has TPA been given? No If patient is a Neuro Trauma and patient is going to OR before floor call report to 4N Charge nurse: [phone] or [phone]   R Recommendations: See Admitting Provider Note  Report given to:

## 2022-07-14 NOTE — ED Notes (Signed)
Patient back from MRI.

## 2022-07-14 NOTE — ED Notes (Signed)
Patient transported to MRI 

## 2022-07-14 NOTE — Assessment & Plan Note (Signed)
Resume Keppra 500 twice daily 

## 2022-07-14 NOTE — Telephone Encounter (Signed)
Daughter in law called in on patient behalf.  Patient cannot stand,. They have been depending on spouse to lift patient to get from point a to point b  Wants a call back in regard, wants to know what they can do to help patient .  Has talked with provider about PT

## 2022-07-14 NOTE — Assessment & Plan Note (Signed)
Resume Cymbalta 

## 2022-07-14 NOTE — Progress Notes (Signed)
Pt.in room eating dinner. Jessica CNA notified pt.is here, needs telemetry placed. Family in room.

## 2022-07-14 NOTE — Assessment & Plan Note (Addendum)
Last fall with head trauma was 06/15/2022.  Head CT at that time without acute abnormalities.  She has not had any subsequent falls.  She is on aspirin.  MRI brain today-  showing small bilateral subdural hematomas over the cerebral convexities.  Patient reports difficulty finding her words, with new right-sided weakness, on exam she has no significant neurologic deficits.  With equal strength on all extremities,and fluent speech. A & O X 4.  - EDP talked to neurosurgeon Dr. Jake Samples.,  No need for emergent surgery, okay to admit here. -I talked to the patient's and daughter-in-law Olegario Messier at bedside about admission to [facility], versus staying here at [facility], they have elected to stay here at [facility], with the understanding that if bleeding should worsen, we do not have neurosurgery here and patient will need to be transferred urgently - Repeat head CT shows stable findings of small hemorrhages - Neuro checks stable and now discontinued  -PT evaluation with recommendation for SNF  -Hold aspirin for  now - discussed with pt/daughter  -Reports intermittent dysuria, checked UA treated with oral fosfomycin on 6/19 -DC to SNF -outpatient follow up with neurosurgery for surveillance -pt to discuss with neurosurgery when ok to restart aspirin if at all  -ambulatory referral placed for appt with neurosurgery

## 2022-07-14 NOTE — ED Triage Notes (Signed)
Pt BIB [facility] for c/o weakness and fall   Pt states she fell yesterday while trying to pick up her dog's bowl and fell landing on her head; pt denies any loc  Pt states she has been feeling weak x 2 days  BP 160/80 P 82 O2 95% RA Cbg 293

## 2022-07-14 NOTE — Progress Notes (Signed)
Asked to call for report by Charge RN. Called ER to speak to [facility], no answer. Will attempt when RN is not busy and can talk.

## 2022-07-14 NOTE — Assessment & Plan Note (Signed)
A1c 7.2. - resume Metformin after discharge - continue to monitor blood glucose at SNF at least 2-3 times per day CBG (last 3)  Recent Labs    07/15/22 1608 07/15/22 2136 07/16/22 0724  GLUCAP 110* 104* 163*   --added prandial novolog with meals eaten>50%

## 2022-07-14 NOTE — Assessment & Plan Note (Signed)
Stable. -Resumed losartan

## 2022-07-14 NOTE — Assessment & Plan Note (Addendum)
Reports residual left-sided deficits from prior stroke.  Today she has 4+5 strength strength in all extremities. Reports generalized weakness. -unfortunately she was not able to tolerate full anticoagulation and now aspirin has to be placed on hold due to subdural hematomas -hold aspirin until she can follow up and discuss with neurosurgery

## 2022-07-15 ENCOUNTER — Observation Stay (HOSPITAL_COMMUNITY): Payer: Medicare Other

## 2022-07-15 DIAGNOSIS — I129 Hypertensive chronic kidney disease with stage 1 through stage 4 chronic kidney disease, or unspecified chronic kidney disease: Secondary | ICD-10-CM | POA: Diagnosis present

## 2022-07-15 DIAGNOSIS — I6200 Nontraumatic subdural hemorrhage, unspecified: Secondary | ICD-10-CM | POA: Diagnosis not present

## 2022-07-15 DIAGNOSIS — J309 Allergic rhinitis, unspecified: Secondary | ICD-10-CM | POA: Diagnosis not present

## 2022-07-15 DIAGNOSIS — M159 Polyosteoarthritis, unspecified: Secondary | ICD-10-CM | POA: Diagnosis not present

## 2022-07-15 DIAGNOSIS — I10 Essential (primary) hypertension: Secondary | ICD-10-CM | POA: Diagnosis not present

## 2022-07-15 DIAGNOSIS — Z9181 History of falling: Secondary | ICD-10-CM | POA: Diagnosis not present

## 2022-07-15 DIAGNOSIS — K649 Unspecified hemorrhoids: Secondary | ICD-10-CM | POA: Diagnosis not present

## 2022-07-15 DIAGNOSIS — I69354 Hemiplegia and hemiparesis following cerebral infarction affecting left non-dominant side: Secondary | ICD-10-CM | POA: Diagnosis not present

## 2022-07-15 DIAGNOSIS — I152 Hypertension secondary to endocrine disorders: Secondary | ICD-10-CM | POA: Diagnosis not present

## 2022-07-15 DIAGNOSIS — S065XAA Traumatic subdural hemorrhage with loss of consciousness status unknown, initial encounter: Secondary | ICD-10-CM | POA: Diagnosis present

## 2022-07-15 DIAGNOSIS — R569 Unspecified convulsions: Secondary | ICD-10-CM | POA: Diagnosis not present

## 2022-07-15 DIAGNOSIS — S06310D Contusion and laceration of right cerebrum without loss of consciousness, subsequent encounter: Secondary | ICD-10-CM | POA: Diagnosis not present

## 2022-07-15 DIAGNOSIS — Z91048 Other nonmedicinal substance allergy status: Secondary | ICD-10-CM | POA: Diagnosis not present

## 2022-07-15 DIAGNOSIS — E1165 Type 2 diabetes mellitus with hyperglycemia: Secondary | ICD-10-CM | POA: Diagnosis not present

## 2022-07-15 DIAGNOSIS — M6281 Muscle weakness (generalized): Secondary | ICD-10-CM | POA: Diagnosis not present

## 2022-07-15 DIAGNOSIS — R262 Difficulty in walking, not elsewhere classified: Secondary | ICD-10-CM | POA: Diagnosis not present

## 2022-07-15 DIAGNOSIS — R488 Other symbolic dysfunctions: Secondary | ICD-10-CM | POA: Diagnosis not present

## 2022-07-15 DIAGNOSIS — R42 Dizziness and giddiness: Secondary | ICD-10-CM | POA: Diagnosis not present

## 2022-07-15 DIAGNOSIS — G8191 Hemiplegia, unspecified affecting right dominant side: Secondary | ICD-10-CM | POA: Diagnosis present

## 2022-07-15 DIAGNOSIS — E1159 Type 2 diabetes mellitus with other circulatory complications: Secondary | ICD-10-CM | POA: Diagnosis not present

## 2022-07-15 DIAGNOSIS — E785 Hyperlipidemia, unspecified: Secondary | ICD-10-CM | POA: Diagnosis not present

## 2022-07-15 DIAGNOSIS — Z79899 Other long term (current) drug therapy: Secondary | ICD-10-CM | POA: Diagnosis not present

## 2022-07-15 DIAGNOSIS — Z7984 Long term (current) use of oral hypoglycemic drugs: Secondary | ICD-10-CM | POA: Diagnosis not present

## 2022-07-15 DIAGNOSIS — N1831 Chronic kidney disease, stage 3a: Secondary | ICD-10-CM | POA: Diagnosis present

## 2022-07-15 DIAGNOSIS — Z741 Need for assistance with personal care: Secondary | ICD-10-CM | POA: Diagnosis not present

## 2022-07-15 DIAGNOSIS — F320 Major depressive disorder, single episode, mild: Secondary | ICD-10-CM | POA: Diagnosis present

## 2022-07-15 DIAGNOSIS — G40909 Epilepsy, unspecified, not intractable, without status epilepticus: Secondary | ICD-10-CM | POA: Diagnosis present

## 2022-07-15 DIAGNOSIS — Z886 Allergy status to analgesic agent status: Secondary | ICD-10-CM | POA: Diagnosis not present

## 2022-07-15 DIAGNOSIS — Z86711 Personal history of pulmonary embolism: Secondary | ICD-10-CM | POA: Diagnosis not present

## 2022-07-15 DIAGNOSIS — E1122 Type 2 diabetes mellitus with diabetic chronic kidney disease: Secondary | ICD-10-CM | POA: Diagnosis present

## 2022-07-15 DIAGNOSIS — Z66 Do not resuscitate: Secondary | ICD-10-CM | POA: Diagnosis present

## 2022-07-15 DIAGNOSIS — I6930 Unspecified sequelae of cerebral infarction: Secondary | ICD-10-CM | POA: Diagnosis not present

## 2022-07-15 DIAGNOSIS — S06320D Contusion and laceration of left cerebrum without loss of consciousness, subsequent encounter: Secondary | ICD-10-CM | POA: Diagnosis not present

## 2022-07-15 DIAGNOSIS — Z888 Allergy status to other drugs, medicaments and biological substances status: Secondary | ICD-10-CM | POA: Diagnosis not present

## 2022-07-15 DIAGNOSIS — Z880 Allergy status to penicillin: Secondary | ICD-10-CM | POA: Diagnosis not present

## 2022-07-15 DIAGNOSIS — Z7982 Long term (current) use of aspirin: Secondary | ICD-10-CM | POA: Diagnosis not present

## 2022-07-15 DIAGNOSIS — L218 Other seborrheic dermatitis: Secondary | ICD-10-CM | POA: Diagnosis not present

## 2022-07-15 DIAGNOSIS — J45909 Unspecified asthma, uncomplicated: Secondary | ICD-10-CM | POA: Diagnosis present

## 2022-07-15 LAB — GLUCOSE, CAPILLARY
Glucose-Capillary: 180 mg/dL — ABNORMAL HIGH (ref 70–99)
Glucose-Capillary: 233 mg/dL — ABNORMAL HIGH (ref 70–99)
Glucose-Capillary: 110 mg/dL — ABNORMAL HIGH (ref 70–99)
Glucose-Capillary: 104 mg/dL — ABNORMAL HIGH (ref 70–99)

## 2022-07-15 LAB — URINALYSIS, ROUTINE W REFLEX MICROSCOPIC
Bilirubin Urine: NEGATIVE
Glucose, UA: 50 mg/dL — AB
Ketones, ur: NEGATIVE mg/dL
Nitrite: NEGATIVE
Protein, ur: NEGATIVE mg/dL
Specific Gravity, Urine: 1.006 (ref 1.005–1.030)
pH: 6 (ref 5.0–8.0)

## 2022-07-15 MED ORDER — INSULIN ASPART 100 UNIT/ML IJ SOLN
4.0000 [IU] | Freq: Three times a day (TID) | INTRAMUSCULAR | Status: DC
  Administered 2022-07-15 – 2022-07-16 (×3): 4 [IU] via SUBCUTANEOUS

## 2022-07-15 MED ORDER — NYSTATIN 100000 UNIT/GM EX POWD
Freq: Three times a day (TID) | CUTANEOUS | Status: DC
  Filled 2022-07-15: qty 15

## 2022-07-15 MED ORDER — FOSFOMYCIN TROMETHAMINE 3 G PO PACK
3.0000 g | PACK | Freq: Once | ORAL | Status: AC
  Administered 2022-07-15: 3 g via ORAL
  Filled 2022-07-15: qty 3

## 2022-07-15 NOTE — [place] FL2 (Signed)
[place] MEDICAID FL2 LEVEL OF CARE FORM     IDENTIFICATION  Patient Name: Anna Robbins Birthdate: 09/15/1932 Sex: female Admission Date (Current Location): 07/14/2022  [facility] and [place] Number:  [employer] and Address:  [facility],  [address] [place] [address]      Provider Number: [id number]  Attending Physician Name and Address:  Cleora Fleet, MD  Relative Name and Phone Number:       Current Level of Care: Hospital Recommended Level of Care: Skilled Nursing Facility Prior Approval Number:    Date Approved/Denied:   PASRR Number: [id number]  Discharge Plan: SNF    Current Diagnoses: Patient Active Problem List   Diagnosis Date Noted   Bilateral subdural hematomas (HCC) 07/14/2022   History of recent fall 07/01/2022   Dizziness 07/01/2022   History of UTI 07/01/2022   Vitamin D deficiency 03/30/2022   Encounter for well adult exam with abnormal findings 12/25/2021   Seborrheic dermatitis of scalp 12/25/2021   Impacted cerumen, right ear 08/22/2021   Allergic rhinitis 07/24/2021   CKD (chronic kidney disease) stage 3, GFR 30-59 ml/min (HCC) 07/24/2021   Hyperkalemia 04/18/2021   Skin lesion 04/18/2021   Dysuria 03/04/2021   Cough variant asthma 12/17/2020   DOE (dyspnea on exertion) 12/17/2020   Hemorrhoid 10/25/2020   Cough, persistent 10/25/2020   Angina pectoris (HCC) 10/25/2020   Osteoarthritis of both knees 06/25/2020   Left leg swelling 06/25/2020   History of CVA with residual deficit 12/28/2019   Yeast dermatitis 12/28/2019   Essential hypertension 12/28/2019   Hyperlipidemia 12/28/2019   Depression, major, single episode, mild (HCC) 12/28/2019   Type 2 diabetes mellitus with hyperglycemia, without long-term current use of insulin (HCC) 12/28/2019   Seizure disorder (HCC) 12/28/2019   Overactive bladder 12/28/2019   Memory changes 12/28/2019    Orientation RESPIRATION BLADDER Height & Weight      Self, Time, Situation, Place  Normal Continent Weight: 202 lb (91.6 kg) Height:  5\' 5"  (165.1 cm)  BEHAVIORAL SYMPTOMS/MOOD NEUROLOGICAL BOWEL NUTRITION STATUS    Convulsions/Seizures (history) Continent Diet (Heart healthy/carb modified. See d/c summary for updates.)  AMBULATORY STATUS COMMUNICATION OF NEEDS Skin   Extensive Assist Verbally Bruising                       Personal Care Assistance Level of Assistance  Bathing, Feeding, Dressing Bathing Assistance: Maximum assistance Feeding assistance: Limited assistance Dressing Assistance: Maximum assistance     Functional Limitations Info  Sight, Hearing, Speech Sight Info: Impaired Hearing Info: Adequate Speech Info: Adequate    SPECIAL CARE FACTORS FREQUENCY  PT (By licensed PT)     PT Frequency: 5x weekly              Contractures      Additional Factors Info  Code Status, Allergies, Psychotropic Code Status Info: DNR Allergies Info: Asa (aspirin), Diphenhydramine Hcl, Lisinopril, Melatonin, Molds & Smuts, Nsaids, Other, Penicillin G, Penicillins, Tramadol Psychotropic Info: Cymbalta         Current Medications (07/15/2022):  This is the current hospital active medication list Current Facility-Administered Medications  Medication Dose Route Frequency Provider Last Rate Last Admin   acetaminophen (TYLENOL) tablet 650 mg  650 mg Oral Q6H PRN Emokpae, Ejiroghene E, MD       Or   acetaminophen (TYLENOL) suppository 650 mg  650 mg Rectal Q6H PRN Emokpae, Ejiroghene E, MD       DULoxetine (CYMBALTA) DR  capsule 20 mg  20 mg Oral Daily Emokpae, Ejiroghene E, MD   20 mg at 07/15/22 1005   gabapentin (NEURONTIN) capsule 600 mg  600 mg Oral QHS Emokpae, Ejiroghene E, MD   600 mg at 07/14/22 2255   insulin aspart (novoLOG) injection 0-5 Units  0-5 Units Subcutaneous QHS Emokpae, Ejiroghene E, MD       insulin aspart (novoLOG) injection 0-9 Units  0-9 Units Subcutaneous TID WC Emokpae, Ejiroghene E, MD   2 Units at  07/15/22 1004   levETIRAcetam (KEPPRA) tablet 500 mg  500 mg Oral BID Emokpae, Ejiroghene E, MD   500 mg at 07/15/22 1005   losartan (COZAAR) tablet 12.5 mg  12.5 mg Oral Daily Emokpae, Ejiroghene E, MD   12.5 mg at 07/15/22 1005   montelukast (SINGULAIR) tablet 10 mg  10 mg Oral QHS Emokpae, Ejiroghene E, MD   10 mg at 07/14/22 2255   ondansetron (ZOFRAN) tablet 4 mg  4 mg Oral Q6H PRN Emokpae, Ejiroghene E, MD       Or   ondansetron (ZOFRAN) injection 4 mg  4 mg Intravenous Q6H PRN Emokpae, Ejiroghene E, MD       oxyCODONE (Oxy IR/ROXICODONE) immediate release tablet 5 mg  5 mg Oral Q8H PRN Emokpae, Ejiroghene E, MD       pantoprazole (PROTONIX) EC tablet 40 mg  40 mg Oral Daily Emokpae, Ejiroghene E, MD   40 mg at 07/15/22 1005   polyethylene glycol (MIRALAX / GLYCOLAX) packet 17 g  17 g Oral Daily PRN Emokpae, Ejiroghene E, MD         Discharge Medications: Please see discharge summary for a list of discharge medications.  Relevant Imaging Results:  Relevant Lab Results:   Additional Information SSN [id number]  Karn [place], [place]

## 2022-07-15 NOTE — Hospital Course (Signed)
89 y.o. female with medical history significant for hypertension, seizure, diabetes mellitus, PE, CKD 3, depression.  Patient presented to the ED with complaints of weakness for the past 2 days, with delayed speech and word finding difficulty.  She reports at baseline she has weakness to her left lower extremity, for over the past few days she has noticed weakness to the right side. Per triage notes, patient had UTI and was treated for this, but per patient this was back in February and she was treated with Bactrim.  Patient also had a significant fall in May for which she had evaluation including head scans, she has not had any falls since then.  She has had intermittent and unchanged headaches since that fall.  She is on aspirin, otherwise not on any blood thinners.   ED Course: Stable vitals.  Chest x-ray negative for acute abnormality.  MRI brain - shows small bilateral subdural hematomas over cerebral convexities. EDP talked to neurosurgeon Dr. Jake Samples, no emergent need for surgery, ok to admit here.

## 2022-07-15 NOTE — Plan of Care (Signed)
  Problem: Acute Rehab PT Goals(only PT should resolve) Goal: Pt Will Go Supine/Side To Sit Outcome: Progressing Flowsheets (Taken 07/15/2022 1352) Pt will go Supine/Side to Sit:  with supervision  with min guard assist Goal: Patient Will Transfer Sit To/From Stand Outcome: Progressing Flowsheets (Taken 07/15/2022 1352) Patient will transfer sit to/from stand:  with minimal assist  with moderate assist Goal: Pt Will Transfer Bed To Chair/Chair To Bed Outcome: Progressing Flowsheets (Taken 07/15/2022 1352) Pt will Transfer Bed to Chair/Chair to Bed:  with min assist  with mod assist Goal: Pt Will Ambulate Outcome: Progressing Flowsheets (Taken 07/15/2022 1352) Pt will Ambulate:  10 feet  with moderate assist  with rolling walker   1:52 PM, 07/15/22 Ocie Bob, MPT Physical Therapist with [facility] [phone] office [phone] mobile phone

## 2022-07-15 NOTE — Progress Notes (Signed)
PROGRESS NOTE   Anna Robbins  ZOX:[id number] DOB: 07/15/1932 DOA: 07/14/2022 PCP: Billie Lade, MD   Chief Complaint  Patient presents with   Weakness   Level of care: Med-Surg  Brief Admission History:  89 y.o. female with medical history significant for hypertension, seizure, diabetes mellitus, PE, CKD 3, depression.  Patient presented to the ED with complaints of weakness for the past 2 days, with delayed speech and word finding difficulty.  She reports at baseline she has weakness to her left lower extremity, for over the past few days she has noticed weakness to the right side. Per triage notes, patient had UTI and was treated for this, but per patient this was back in February and she was treated with Bactrim.  Patient also had a significant fall in May for which she had evaluation including head scans, she has not had any falls since then.  She has had intermittent and unchanged headaches since that fall.  She is on aspirin, otherwise not on any blood thinners.   ED Course: Stable vitals.  Chest x-ray negative for acute abnormality.  MRI brain - shows small bilateral subdural hematomas over cerebral convexities. EDP talked to neurosurgeon Dr. Jake Samples, no emergent need for surgery, ok to admit here.   Assessment and Plan: * Bilateral subdural hematomas (HCC) Last fall with head trauma was 06/15/2022.  Head CT at that time without acute abnormalities.  She has not had any subsequent falls.  She is on aspirin.  MRI brain today-  showing small bilateral subdural hematomas over the cerebral convexities.  Patient reports difficulty finding her words, with new right-sided weakness, on exam she has no significant neurologic deficits.  With equal strength on all extremities,and fluent speech. A & O X 4.  - EDP talked to neurosurgeon Dr. Jake Samples.,  No need for emergent surgery, okay to admit here. -I talked to the patient's and daughter-in-law Olegario Messier at bedside about admission to [facility],  versus staying here at [facility], they have elected to stay here at [facility], with the understanding that if bleeding should worsen, we do not have neurosurgery here and patient will need to be transferred urgently - Repeat head CT shows stable findings of small hemorrhages - Neuro checks stable and now discontinued  -PT evaluation with recommendation for SNF  -Hold aspirin for  now - discussed with pt/daughter  -Reports intermittent dysuria, check UA  Subdural hematoma (HCC) --repeated CT scan 6/19 shows stable findings from MRI done on 6/18 --neurosurgery recommended monitoring only, no need for surgical intervention --holding anticoagulation (daily aspirin on hold)  Seizure disorder (HCC) Resumed Keppra 500 twice daily Seizure precautions   Type 2 diabetes mellitus with hyperglycemia, without long-term current use of insulin (HCC) A1c 7.2. - Hold Metformin - SSI- S CBG (last 3)  Recent Labs    07/14/22 2259 07/15/22 0722 07/15/22 1104  GLUCAP 156* 180* 233*   --added prandial novolog with meals eaten>50%   Depression, major, single episode, mild (HCC) Resumed home Cymbalta  Essential hypertension Stable. -Resumed losartan  History of CVA with residual deficit Reports residual left-sided deficits from prior stroke.  Today she has 4+5 strength strength in all extremities. Reports generalized weakness. -unfortunately she was not able to tolerate full anticoagulation and now aspirin has to be placed on hold due to subdural hematomas  DVT prophylaxis: SCD Code Status: DNR  Family Communication: daughter at bedside 6/19  Disposition: Status is: Inpatient   Consultants:  Neurosurgery - telephone consult  Procedures:   Antimicrobials:    Subjective: No specific complaints   Objective: Vitals:   07/14/22 1145 07/14/22 1546 07/14/22 1858 07/15/22 0353  BP:  113/87 (!) 154/69 (!) 151/64  Pulse: 71   64  Resp: 17  19 18   Temp:  98.5 F (36.9 C)  98.3 F (36.8  C)  TempSrc:  Oral    SpO2: 94%  95% 97%  Weight:      Height:        Intake/Output Summary (Last 24 hours) at 07/15/2022 1408 Last data filed at 07/15/2022 1100 Gross per 24 hour  Intake 720 ml  Output 1500 ml  Net -780 ml   Filed Weights   07/14/22 1030  Weight: 91.6 kg   Examination:  General exam: Appears calm and comfortable  Respiratory system: Clear to auscultation. Respiratory effort normal. Cardiovascular system: normal S1 & S2 heard. No JVD, murmurs, rubs, gallops or clicks. No pedal edema. Gastrointestinal system: Abdomen is nondistended, soft and nontender. No organomegaly or masses felt. Normal bowel sounds heard. Central nervous system: Alert and oriented. No focal neurological deficits. Extremities: Symmetric 5 x 5 power. Skin: No rashes, lesions or ulcers. Psychiatry: Judgement and insight appear normal. Mood & affect appropriate.   Data Reviewed: I have personally reviewed following labs and imaging studies  CBC: Recent Labs  Lab 07/14/22 1144  WBC 5.1  NEUTROABS 2.9  HGB 10.9*  HCT 34.7*  MCV 93.3  PLT 244    Basic Metabolic Panel: Recent Labs  Lab 07/14/22 1144  NA 137  K 4.3  CL 101  CO2 27  GLUCOSE 237*  BUN 25*  CREATININE 1.01*  CALCIUM 9.3    CBG: Recent Labs  Lab 07/14/22 2259 07/15/22 0722 07/15/22 1104  GLUCAP 156* 180* 233*    No results found for this or any previous visit (from the past 240 hour(s)).   Radiology Studies: CT HEAD WO CONTRAST  Result Date: 07/15/2022 CLINICAL DATA:  Subdural hematoma Follow up on Sub dural hematomas on MRI 07/14/22. EXAM: CT HEAD WITHOUT CONTRAST TECHNIQUE: Contiguous axial images were obtained from the base of the skull through the vertex without intravenous contrast. RADIATION DOSE REDUCTION: This exam was performed according to the departmental dose-optimization program which includes automated exposure control, adjustment of the mA and/or kV according to patient size and/or use of  iterative reconstruction technique. COMPARISON:  MRI July 14, 2022. FINDINGS: Brain: Similar small bilateral subdural hematomas, which were better visualized on MRI from yesterday. No mass effect. No evidence of acute large vascular territory infarct, mass lesion, midline shift or hydrocephalus. Small chronic right frontal infarct. Vascular: Calcific atherosclerosis. Skull: No acute fracture. Sinuses/Orbits: Clear sinuses.  No acute orbital findings. Other: No mastoid effusions. IMPRESSION: Similar small bilateral subdural hematomas, which were better visualized on MRI from yesterday. No mass effect. Electronically Signed   By: Feliberto Harts M.D.   On: 07/15/2022 12:48   MR Brain Wo Contrast (neuro protocol)  Result Date: 07/14/2022 CLINICAL DATA:  Neuro deficit, acute, stroke suspected. Weakness. Fall. EXAM: MRI HEAD WITHOUT CONTRAST TECHNIQUE: Multiplanar, multiecho pulse sequences of the brain and surrounding structures were obtained without intravenous contrast. COMPARISON:  Head CT 05/31/2022 and MRI 03/06/2020 FINDINGS: Brain: Small subdural hematomas over both cerebral convexities measure up to 2 mm in thickness on the right and 3 mm on the left without mass effect. These were not evident on last month's CT. There are also small subdural hematomas in the posterior fossa measuring up to  2 mm in thickness on the right and 5 mm on the left without mass effect. No acute infarct, mass, or midline shift is evident. A small focus of cortical encephalomalacia is again noted in the parasagittal posterior right frontal lobe with associated chronic blood products, and there are also chronic blood products overlying the parasagittal left frontal lobe in this region as shown on the prior MRI. Mild cerebral atrophy is within normal limits for age. Cerebral white matter T2 hyperintensities are unchanged and nonspecific but compatible with minimal chronic small vessel ischemic disease. Vascular: Major intracranial  vascular flow voids are preserved. Skull and upper cervical spine: No suspicious marrow lesion. Sinuses/Orbits: Bilateral cataract extraction. Paranasal sinuses and mastoid air cells are clear. Other: None. Critical Value/emergent results were called by telephone at the time of interpretation on 07/14/2022 at 2:22 pm to Dr. Gerhard Munch, who verbally acknowledged these results. IMPRESSION: 1. Small bilateral subdural hematomas over the cerebral convexities and in the posterior fossa. No mass effect. 2. No acute infarct. 3. Small chronic right frontal infarct. Electronically Signed   By: Sebastian Ache M.D.   On: 07/14/2022 14:25   DG Chest 2 View  Result Date: 07/14/2022 CLINICAL DATA:  Weakness EXAM: CHEST - 2 VIEW COMPARISON:  X-ray 05/31/2022 FINDINGS: No consolidation, pneumothorax or effusion. No edema. Normal cardiopericardial silhouette. Overlapping cardiac leads. Calcified right midlung nodule identified consistent with old granulomatous disease. Stable sclerotic focus overlying the left humeral head. IMPRESSION: No acute cardiopulmonary disease. Electronically Signed   By: Karen Kays M.D.   On: 07/14/2022 12:15    Scheduled Meds:  DULoxetine  20 mg Oral Daily   gabapentin  600 mg Oral QHS   insulin aspart  0-5 Units Subcutaneous QHS   insulin aspart  0-9 Units Subcutaneous TID WC   insulin aspart  4 Units Subcutaneous TID WC   levETIRAcetam  500 mg Oral BID   losartan  12.5 mg Oral Daily   montelukast  10 mg Oral QHS   pantoprazole  40 mg Oral Daily   Continuous Infusions:   LOS: 0 days   Time spent: 35 mins  Cristhian Vanhook Laural Benes, MD How to contact the [facility] Attending or Consulting provider 7A - 7P or covering provider during after hours 7P -7A, for this patient?  Check the care team in [facility] and look for a) attending/consulting TRH provider listed and b) the [facility] team listed Log into [email or web address] and use [place]'s universal password to access. If you do not have the password, please  contact the hospital operator. Locate the [facility] provider you are looking for under [facility] and page to a number that you can be directly reached. If you still have difficulty reaching the provider, please page the [facility] (Director on Call) for the Hospitalists listed on amion for assistance.  07/15/2022, 2:08 PM

## 2022-07-15 NOTE — Evaluation (Signed)
Physical Therapy Evaluation Patient Details Name: Anna Robbins MRN: [id number] DOB: 09-08-1932 Today's Date: 07/15/2022  History of Present Illness  Kanoe Oland is a 89 y.o. female with medical history significant for hypertension, seizure, diabetes mellitus, PE, CKD 3, depression.  Patient presented to the ED with complaints of weakness for the past 2 days, with delayed speech and word finding difficulty.  She reports at baseline she has weakness to her left lower extremity, for over the past few days she has noticed weakness to the right side.  Per triage notes, patient had UTI and was treated for this, but per patient this was back in February and she was treated with Bactrim.  Patient also had a significant fall in May for which she had evaluation including head scans, she has not had any falls since then.  She has had intermittent and unchanged headaches since that fall.  She is on aspirin, otherwise not on any blood thinners.   Clinical Impression  Patient demonstrates fair/good return for sitting up at bedside with [facility] raised, very unsteady on feet requiring repeated attempts before able to stand with RW, able to transfer to [facility], but incontinent of urine upon standing and limited to a couple of side steps before having to sit due to LLE buckling due to weakness. Patient tolerated sitting up in chair with daughter present after therapy.  Patient will benefit from continued skilled physical therapy in hospital and recommended venue below to increase strength, balance, endurance for safe ADLs and gait.         Recommendations for follow up therapy are one component of a multi-disciplinary discharge planning process, led by the attending physician.  Recommendations may be updated based on patient status, additional functional criteria and insurance authorization.  Follow Up Recommendations Can patient physically be transported by private vehicle: No     Assistance Recommended at  Discharge Set up Supervision/Assistance  Patient can return home with the following  A lot of help with bathing/dressing/bathroom;A lot of help with walking and/or transfers;Help with stairs or ramp for entrance;Assistance with cooking/housework    Equipment Recommendations None recommended by PT  Recommendations for Other Services       Functional Status Assessment Patient has had a recent decline in their functional status and demonstrates the ability to make significant improvements in function in a reasonable and predictable amount of time.     Precautions / Restrictions Precautions Precautions: Fall Restrictions Weight Bearing Restrictions: No      Mobility  Bed Mobility Overal bed mobility: Needs Assistance Bed Mobility: Supine to Sit     Supine to sit: Mod assist, Min assist, Min guard, HOB elevated     General bed mobility comments: labored movement    Transfers Overall transfer level: Needs assistance Equipment used: Rolling walker (2 wheels) Transfers: Sit to/from Stand, Bed to chair/wheelchair/BSC Sit to Stand: Mod assist, Max assist   Step pivot transfers: Mod assist, Max assist       General transfer comment: unsteady labored movement with frequent buckling of LLE    Ambulation/Gait Ambulation/Gait assistance: Max assist Gait Distance (Feet): 3 Feet Assistive device: Rolling walker (2 wheels) Gait Pattern/deviations: Decreased step length - right, Decreased step length - left, Decreased stride length Gait velocity: slow     General Gait Details: limited to a few slow labored side steps before having to sit due to LLE giving way  Careers information officer  Modified Rankin (Stroke Patients Only)       Balance Overall balance assessment: Needs assistance Sitting-balance support: Feet supported, No upper extremity supported Sitting balance-Leahy Scale: Fair Sitting balance - Comments: fair/good seated at EOB   Standing  balance support: During functional activity, Reliant on assistive device for balance, Bilateral upper extremity supported Standing balance-Leahy Scale: Poor Standing balance comment: using RW                             Pertinent Vitals/Pain Pain Assessment Pain Assessment: Faces Faces Pain Scale: Hurts a little bit Pain Location: neck and bilateral knees with movement Pain Descriptors / Indicators: Discomfort, Sore Pain Intervention(s): Limited activity within patient's tolerance, Monitored during session, Repositioned    Home Living Family/patient expects to be discharged to:: Private residence Living Arrangements: Children Available Help at Discharge: Family;Available PRN/intermittently Type of Home: House Home Access: Ramped entrance     Alternate Level Stairs-Number of Steps: Patient stays on 1st floor Home Layout: Two level;Able to live on main level with bedroom/bathroom;1/2 bath on main level Home Equipment: Rolling Walker (2 wheels);Rollator (4 wheels);Wheelchair - manual;BSC/3in1;Grab bars - tub/shower      Prior Function Prior Level of Function : Needs assist       Physical Assist : Mobility (physical);ADLs (physical) Mobility (physical): Bed mobility;Transfers;Gait;Stairs   Mobility Comments: household ambulator using RW ADLs Comments: Assisted by family     Hand Dominance        Extremity/Trunk Assessment   Upper Extremity Assessment Upper Extremity Assessment: Generalized weakness    Lower Extremity Assessment Lower Extremity Assessment: Generalized weakness    Cervical / Trunk Assessment Cervical / Trunk Assessment: Kyphotic  Communication   Communication: No difficulties  Cognition Arousal/Alertness: Awake/alert Behavior During Therapy: WFL for tasks assessed/performed Overall Cognitive Status: Within Functional Limits for tasks assessed                                          General Comments       Exercises     Assessment/Plan    PT Assessment Patient needs continued PT services  PT Problem List Decreased strength;Decreased activity tolerance;Decreased balance;Decreased mobility       PT Treatment Interventions DME instruction;Gait training;Functional mobility training;Therapeutic activities;Therapeutic exercise;Patient/family education;Balance training    PT Goals (Current goals can be found in the Care Plan section)  Acute Rehab PT Goals Patient Stated Goal: return home with family to assist PT Goal Formulation: With patient/family Time For Goal Achievement: 07/29/22 Potential to Achieve Goals: Good    Frequency Min 3X/week     Co-evaluation               AM-PAC PT "6 Clicks" Mobility  Outcome Measure Help needed turning from your back to your side while in a flat bed without using bedrails?: A Little Help needed moving from lying on your back to sitting on the side of a flat bed without using bedrails?: A Little Help needed moving to and from a bed to a chair (including a wheelchair)?: A Lot Help needed standing up from a chair using your arms (e.g., wheelchair or bedside chair)?: A Lot Help needed to walk in hospital room?: A Lot Help needed climbing 3-5 steps with a railing? : Total 6 Click Score: 13    End of Session   Activity Tolerance: Patient  tolerated treatment well;Patient limited by fatigue Patient left: in chair;with call bell/phone within reach Nurse Communication: Mobility status PT Visit Diagnosis: Unsteadiness on feet (R26.81);Other abnormalities of gait and mobility (R26.89);Muscle weakness (generalized) (M62.81)    Time: 0981-1914 PT Time Calculation (min) (ACUTE ONLY): 25 min   Charges:   PT Evaluation $PT Eval Moderate Complexity: 1 Mod PT Treatments $Therapeutic Activity: 23-37 mins        1:51 PM, 07/15/22 Ocie Bob, MPT Physical Therapist with [facility] [phone] office [phone] mobile  phone

## 2022-07-15 NOTE — TOC Progression Note (Signed)
Transition of Care [facility]) - Progression Note    Patient Details  Name: Anna Robbins MRN: [id number] Date of Birth: 02/26/1932  Transition of Care [facility]) CM/SW Contact  Karn Cassis, [place] Phone Number: 07/15/2022, 1:29 PM  Clinical Narrative: Daughter-in-law accepts bed at [facility]. Facility notified. CMA updating authorization.        Expected Discharge Plan: Skilled Nursing Facility Barriers to Discharge: Continued Medical Work up  Expected Discharge Plan and Services In-house Referral: Clinical Social Work   Post Acute Care Choice: Skilled Nursing Facility Living arrangements for the past 2 months: Single Family Home                                       Social Determinants of Health (SDOH) Interventions SDOH Screenings   Food Insecurity: No Food Insecurity (07/14/2022)  Housing: Low Risk  (07/14/2022)  Transportation Needs: No Transportation Needs (07/14/2022)  Utilities: Not At Risk (07/14/2022)  Alcohol Screen: Low Risk  (02/18/2022)  Depression (PHQ2-9): Medium Risk (06/25/2022)  Financial Resource Strain: Low Risk  (02/18/2022)  Physical Activity: Inactive (04/29/2022)  Social Connections: Moderately Integrated (02/18/2022)  Stress: Stress Concern Present (04/29/2022)  Tobacco Use: Low Risk  (07/14/2022)    Readmission Risk Interventions     No data to display

## 2022-07-15 NOTE — Assessment & Plan Note (Signed)
--  repeated CT scan 6/19 shows stable findings from MRI done on 6/18 --neurosurgery recommended monitoring only, no need for surgical intervention --holding anticoagulation (daily aspirin on hold)

## 2022-07-15 NOTE — TOC Initial Note (Signed)
Transition of Care [facility]) - Initial/Assessment Note    Patient Details  Name: Anna Robbins MRN: [id number] Date of Birth: [birth year]  Transition of Care [facility]) CM/SW Contact:    Karn Cassis, LCSW Phone Number: 07/15/2022, 10:10 AM  Clinical Narrative:  Pt admitted due to bilateral subdural hematomas. Assessment completed with pt's daughter-in-law, [name] who reports she is also HCPOA. Pt lives with son, daughter-in-law, and 2 grandsons. She is fairly independent at baseline and ambulates with a cane. PT evaluated pt and recommend SNF. Discussed SNF placement and authorization as well as Medicare.gov ratings. Will initiate bed search and authorization.                 Expected Discharge Plan: Skilled Nursing Facility Barriers to Discharge: Continued Medical Work up   Patient Goals and CMS Choice Patient states their goals for this hospitalization and ongoing recovery are:: SNF   Choice offered to / list presented to : [facility] POA / Guardian [place] ownership interest in [facility].provided to:: [facility] POA / Guardian    Expected Discharge Plan and Services In-house Referral: Clinical Social Work   Post Acute Care Choice: Skilled Nursing Facility Living arrangements for the past 2 months: Single Family Home                                      Prior Living Arrangements/Services Living arrangements for the past 2 months: Single Family Home Lives with:: Adult Children, Relatives Patient language and need for interpreter reviewed:: Yes Do you feel safe going back to the place where you live?: Yes          Current home services: DME (wheelchair, cane, walker, BSC, lift chair) Criminal Activity/Legal Involvement Pertinent to Current Situation/Hospitalization: No - Comment as needed  Activities of Daily Living Home Assistive Devices/Equipment: Shower chair with back, Bedside commode/3-in-1, Wheelchair, Environmental consultant (specify type), CBG Meter ADL Screening  (condition at time of admission) Patient's cognitive ability adequate to safely complete daily activities?: Yes Is the patient deaf or have difficulty hearing?: No Does the patient have difficulty seeing, even when wearing glasses/contacts?: No Does the patient have difficulty concentrating, remembering, or making decisions?: No Patient able to express need for assistance with ADLs?: Yes Does the patient have difficulty dressing or bathing?: No Independently performs ADLs?: Yes (appropriate for developmental age) Does the patient have difficulty walking or climbing stairs?: Yes Weakness of Legs: Both Weakness of Arms/Hands: None  Permission Sought/Granted                  Emotional Assessment     Affect (typically observed): Appropriate   Alcohol / Substance Use: Not Applicable Psych Involvement: No (comment)  Admission diagnosis:  SDH (subdural hematoma) (HCC) [S06.5XAA] Bilateral subdural hematomas (HCC) [S06.5XAA] Patient Active Problem List   Diagnosis Date Noted   Bilateral subdural hematomas (HCC) 07/14/2022   History of recent fall 07/01/2022   Dizziness 07/01/2022   History of UTI 07/01/2022   Vitamin D deficiency 03/30/2022   Encounter for well adult exam with abnormal findings 12/25/2021   Seborrheic dermatitis of scalp 12/25/2021   Impacted cerumen, right ear 08/22/2021   Allergic rhinitis 07/24/2021   CKD (chronic kidney disease) stage 3, GFR 30-59 ml/min (HCC) 07/24/2021   Hyperkalemia 04/18/2021   Skin lesion 04/18/2021   Dysuria 03/04/2021   Cough variant asthma 12/17/2020   DOE (dyspnea on exertion) 12/17/2020   Hemorrhoid  10/25/2020   Cough, persistent 10/25/2020   Angina pectoris (HCC) 10/25/2020   Osteoarthritis of both knees 06/25/2020   Left leg swelling 06/25/2020   History of CVA with residual deficit 12/28/2019   Yeast dermatitis 12/28/2019   Essential hypertension 12/28/2019   Hyperlipidemia 12/28/2019   Depression, major, single  episode, mild (HCC) 12/28/2019   Type 2 diabetes mellitus with hyperglycemia, without long-term current use of insulin (HCC) 12/28/2019   Seizure disorder (HCC) 12/28/2019   Overactive bladder 12/28/2019   Memory changes 12/28/2019   PCP:  Billie Lade, MD Pharmacy:   [facility] [phone] - [place], [place] - [address] SEC [address] [place] [address] Phone: [phone] Fax: [phone]     Social Determinants of Health (SDOH) Social History: SDOH Screenings   Food Insecurity: No Food Insecurity (07/14/2022)  Housing: Low Risk  (07/14/2022)  Transportation Needs: No Transportation Needs (07/14/2022)  Utilities: Not At Risk (07/14/2022)  Alcohol Screen: Low Risk  (02/18/2022)  Depression (PHQ2-9): Medium Risk (06/25/2022)  Financial Resource Strain: Low Risk  (02/18/2022)  Physical Activity: Inactive (04/29/2022)  Social Connections: Moderately Integrated (02/18/2022)  Stress: Stress Concern Present (04/29/2022)  Tobacco Use: Low Risk  (07/14/2022)   SDOH Interventions:     Readmission Risk Interventions     No data to display

## 2022-07-15 NOTE — Progress Notes (Signed)
Unable to obtain urine sample prev shift and thus far due to pt's mobility. PT ordered and family would like to see if pt.can go to [facility]. Hat will be placed to obtain sample.

## 2022-07-15 NOTE — Progress Notes (Signed)
Ok to remove and not replace pt's IV per Dr.Johnson. Pt.verbalizes great discomfort with IV's and tape/tegaderms used to secure.

## 2022-07-16 ENCOUNTER — Telehealth: Payer: Self-pay

## 2022-07-16 DIAGNOSIS — M159 Polyosteoarthritis, unspecified: Secondary | ICD-10-CM | POA: Diagnosis not present

## 2022-07-16 DIAGNOSIS — Z9181 History of falling: Secondary | ICD-10-CM | POA: Diagnosis not present

## 2022-07-16 DIAGNOSIS — R262 Difficulty in walking, not elsewhere classified: Secondary | ICD-10-CM | POA: Diagnosis not present

## 2022-07-16 DIAGNOSIS — S065XAA Traumatic subdural hemorrhage with loss of consciousness status unknown, initial encounter: Secondary | ICD-10-CM | POA: Diagnosis not present

## 2022-07-16 DIAGNOSIS — R42 Dizziness and giddiness: Secondary | ICD-10-CM | POA: Diagnosis not present

## 2022-07-16 DIAGNOSIS — S06310D Contusion and laceration of right cerebrum without loss of consciousness, subsequent encounter: Secondary | ICD-10-CM | POA: Diagnosis not present

## 2022-07-16 DIAGNOSIS — R569 Unspecified convulsions: Secondary | ICD-10-CM | POA: Diagnosis not present

## 2022-07-16 DIAGNOSIS — N1831 Chronic kidney disease, stage 3a: Secondary | ICD-10-CM | POA: Diagnosis not present

## 2022-07-16 DIAGNOSIS — S06320D Contusion and laceration of left cerebrum without loss of consciousness, subsequent encounter: Secondary | ICD-10-CM | POA: Diagnosis not present

## 2022-07-16 DIAGNOSIS — Z741 Need for assistance with personal care: Secondary | ICD-10-CM | POA: Diagnosis not present

## 2022-07-16 DIAGNOSIS — L218 Other seborrheic dermatitis: Secondary | ICD-10-CM | POA: Diagnosis not present

## 2022-07-16 DIAGNOSIS — E1165 Type 2 diabetes mellitus with hyperglycemia: Secondary | ICD-10-CM | POA: Diagnosis not present

## 2022-07-16 DIAGNOSIS — F320 Major depressive disorder, single episode, mild: Secondary | ICD-10-CM | POA: Diagnosis not present

## 2022-07-16 DIAGNOSIS — I152 Hypertension secondary to endocrine disorders: Secondary | ICD-10-CM | POA: Diagnosis not present

## 2022-07-16 DIAGNOSIS — K649 Unspecified hemorrhoids: Secondary | ICD-10-CM | POA: Diagnosis not present

## 2022-07-16 DIAGNOSIS — J309 Allergic rhinitis, unspecified: Secondary | ICD-10-CM | POA: Diagnosis not present

## 2022-07-16 DIAGNOSIS — I6930 Unspecified sequelae of cerebral infarction: Secondary | ICD-10-CM | POA: Diagnosis not present

## 2022-07-16 DIAGNOSIS — E1151 Type 2 diabetes mellitus with diabetic peripheral angiopathy without gangrene: Secondary | ICD-10-CM | POA: Diagnosis not present

## 2022-07-16 DIAGNOSIS — G40909 Epilepsy, unspecified, not intractable, without status epilepticus: Secondary | ICD-10-CM | POA: Diagnosis not present

## 2022-07-16 DIAGNOSIS — I6200 Nontraumatic subdural hemorrhage, unspecified: Secondary | ICD-10-CM | POA: Diagnosis not present

## 2022-07-16 DIAGNOSIS — M6281 Muscle weakness (generalized): Secondary | ICD-10-CM | POA: Diagnosis not present

## 2022-07-16 DIAGNOSIS — I10 Essential (primary) hypertension: Secondary | ICD-10-CM | POA: Diagnosis not present

## 2022-07-16 DIAGNOSIS — E1159 Type 2 diabetes mellitus with other circulatory complications: Secondary | ICD-10-CM | POA: Diagnosis not present

## 2022-07-16 DIAGNOSIS — R488 Other symbolic dysfunctions: Secondary | ICD-10-CM | POA: Diagnosis not present

## 2022-07-16 DIAGNOSIS — E785 Hyperlipidemia, unspecified: Secondary | ICD-10-CM | POA: Diagnosis not present

## 2022-07-16 LAB — GLUCOSE, CAPILLARY: Glucose-Capillary: 163 mg/dL — ABNORMAL HIGH (ref 70–99)

## 2022-07-16 MED ORDER — NYSTATIN 100000 UNIT/GM EX POWD: Freq: Three times a day (TID) | CUTANEOUS | 0 refills | Status: AC

## 2022-07-16 MED ORDER — POLYETHYLENE GLYCOL 3350 17 G PO PACK: 17.0000 g | PACK | Freq: Every day | ORAL | 0 refills | Status: AC | PRN

## 2022-07-16 MED ORDER — KETOCONAZOLE-HYDROCORTISONE 2-2.5 % EX CREA
1.0000 | TOPICAL_CREAM | Freq: Every day | CUTANEOUS | 0 refills | Status: AC | PRN
Start: 2022-07-16 — End: ?

## 2022-07-16 MED ORDER — HYDROCORTISONE ACETATE 25 MG RE SUPP
25.0000 mg | Freq: Two times a day (BID) | RECTAL | 0 refills | Status: AC | PRN
Start: 2022-07-16 — End: ?

## 2022-07-16 NOTE — TOC Transition Note (Signed)
Transition of Care [facility]) - CM/SW Discharge Note   Patient Details  Name: Anna Robbins MRN: [id number] Date of Birth: 08-01-1932  Transition of Care [facility]) CM/SW Contact:  Elliot Gault, LCSW Phone Number: 07/16/2022, 10:39 AM   Clinical Narrative:     Pt medically stable for dc per MD. Insurance has approved SNF rehab. Kerri at [facility] states they can take pt today.  Updated pt's DIL who remains in agreement with the dc plan.  DC clinical sent electronically. RN to call report.  No other TOC needs for dc.  Final next level of care: Skilled Nursing Facility Barriers to Discharge: Barriers Resolved   Patient Goals and CMS Choice CMS Medicare.gov Compare Post Acute Care list provided to:: Patient Represenative (must comment) Choice offered to / list presented to : [facility] POA / Guardian  Discharge Placement                Patient chooses bed at: [facility] Patient to be transferred to facility by: w/c Name of family member notified: [name] Patient and family notified of of transfer: 07/16/22  Discharge Plan and Services Additional resources added to the After Visit Summary for   In-house Referral: Clinical Social Work   Post Acute Care Choice: Skilled Nursing Facility                               Social Determinants of Health (SDOH) Interventions SDOH Screenings   Food Insecurity: No Food Insecurity (07/14/2022)  Housing: Low Risk  (07/14/2022)  Transportation Needs: No Transportation Needs (07/14/2022)  Utilities: Not At Risk (07/14/2022)  Alcohol Screen: Low Risk  (02/18/2022)  Depression (PHQ2-9): Medium Risk (06/25/2022)  Financial Resource Strain: Low Risk  (02/18/2022)  Physical Activity: Inactive (04/29/2022)  Social Connections: Moderately Integrated (02/18/2022)  Stress: Stress Concern Present (04/29/2022)  Tobacco Use: Low Risk  (07/14/2022)     Readmission Risk Interventions     No data to display

## 2022-07-16 NOTE — Discharge Instructions (Signed)
IMPORTANT INFORMATION: PAY CLOSE ATTENTION   PHYSICIAN DISCHARGE INSTRUCTIONS  Follow with Primary care provider  Dixon, Phillip E, MD  and other consultants as instructed by your Hospitalist Physician  SEEK MEDICAL CARE OR RETURN TO EMERGENCY ROOM IF SYMPTOMS COME BACK, WORSEN OR NEW PROBLEM DEVELOPS   Please note: You were cared for by a hospitalist during your hospital stay. Every effort will be made to forward records to your primary care provider.  You can request that your primary care provider send for your hospital records if they have not received them.  Once you are discharged, your primary care physician will handle any further medical issues. Please note that NO REFILLS for any discharge medications will be authorized once you are discharged, as it is imperative that you return to your primary care physician (or establish a relationship with a primary care physician if you do not have one) for your post hospital discharge needs so that they can reassess your need for medications and monitor your lab values.  Please get a complete blood count and chemistry panel checked by your Primary MD at your next visit, and again as instructed by your Primary MD.  Get Medicines reviewed and adjusted: Please take all your medications with you for your next visit with your Primary MD  Laboratory/radiological data: Please request your Primary MD to go over all hospital tests and procedure/radiological results at the follow up, please ask your primary care provider to get all Hospital records sent to his/her office.  In some cases, they will be blood work, cultures and biopsy results pending at the time of your discharge. Please request that your primary care provider follow up on these results.  If you are diabetic, please bring your blood sugar readings with you to your follow up appointment with primary care.    Please call and make your follow up appointments as soon as possible.    Also Note  the following: If you experience worsening of your admission symptoms, develop shortness of breath, life threatening emergency, suicidal or homicidal thoughts you must seek medical attention immediately by calling 911 or calling your MD immediately  if symptoms less severe.  You must read complete instructions/literature along with all the possible adverse reactions/side effects for all the Medicines you take and that have been prescribed to you. Take any new Medicines after you have completely understood and accpet all the possible adverse reactions/side effects.   Do not drive when taking Pain medications or sleeping medications (Benzodiazepines)  Do not take more than prescribed Pain, Sleep and Anxiety Medications. It is not advisable to combine anxiety,sleep and pain medications without talking with your primary care practitioner  Special Instructions: If you have smoked or chewed Tobacco  in the last 2 yrs please stop smoking, stop any regular Alcohol  and or any Recreational drug use.  Wear Seat belts while driving.  Do not drive if taking any narcotic, mind altering or controlled substances or recreational drugs or alcohol.       

## 2022-07-16 NOTE — Discharge Summary (Signed)
Physician Discharge Summary  Anna Robbins GNF:[id number] DOB: 12/23/1932 DOA: 07/14/2022  PCP: Billie Lade, MD  Admit date: 07/14/2022 Discharge date: 07/16/2022  Disposition:  [facility] SNF   Recommendations for Outpatient Follow-up:  Follow up with PCP in 2 weeks Follow up with neurosurgery in 2 weeks for hospital follow up recheck Please monitor blood glucose per facility protocol at least 2-3 times per day Please discuss with neurosurgery when and if restarting aspirin is recommended Fall precautions recommended   Home Health: SNF   Discharge Condition: STABLE   CODE STATUS: DNR DIET:  Heart Healthy / Carb Modified   Brief Hospitalization Summary: Please see all hospital notes, images, labs for full details of the hospitalization. Admission provider HPI:  89 y.o. female with medical history significant for hypertension, seizure, diabetes mellitus, PE, CKD 3, depression.  Patient presented to the ED with complaints of weakness for the past 2 days, with delayed speech and word finding difficulty.  She reports at baseline she has weakness to her left lower extremity, for over the past few days she has noticed weakness to the right side. Per triage notes, patient had UTI and was treated for this, but per patient this was back in February and she was treated with Bactrim.  Patient also had a significant fall in May for which she had evaluation including head scans, she has not had any falls since then.  She has had intermittent and unchanged headaches since that fall.  She is on aspirin, otherwise not on any blood thinners.   ED Course: Stable vitals.  Chest x-ray negative for acute abnormality.  MRI brain - shows small bilateral subdural hematomas over cerebral convexities.  EDP talked to neurosurgeon Dr. Jake Samples, no emergent need for surgery, ok to admit here.  HOSPITAL COURSE BY PROBLEM   Bilateral subdural hematomas  Last fall with head trauma was 06/15/2022.  Head CT at that  time without acute abnormalities.  She has not had any subsequent falls.  She is on aspirin.  MRI brain today-  showing small bilateral subdural hematomas over the cerebral convexities.  Patient reports difficulty finding her words, with new right-sided weakness, on exam she has no significant neurologic deficits.  With equal strength on all extremities,and fluent speech. A & O X 4.  - EDP talked to neurosurgeon Dr. Jake Samples.,  No need for emergent surgery, okay to admit here. -I talked to the patient's and daughter-in-law Anna Robbins at bedside about admission to [facility], versus staying here at [facility], they have elected to stay here at [facility], with the understanding that if bleeding should worsen, we do not have neurosurgery here and patient will need to be transferred urgently - Repeat head CT shows stable findings of small hemorrhages - Neuro checks stable and now discontinued  -PT evaluation with recommendation for SNF  -Hold aspirin for  now - discussed with pt/daughter  -Reports intermittent dysuria, checked UA treated with oral fosfomycin on 6/19 -DC to SNF -outpatient follow up with neurosurgery for surveillance -pt to discuss with neurosurgery when ok to restart aspirin if at all  -ambulatory referral placed for appt with neurosurgery  Subdural hematoma  --repeated CT scan 6/19 shows stable findings from MRI done on 6/18 --neurosurgery recommended monitoring only, no need for surgical intervention --holding anticoagulation (daily aspirin on hold)  Seizure disorder  Resumed Keppra 500 twice daily Seizure precautions   Type 2 diabetes mellitus with hyperglycemia, without long-term current use of insulin  - resume Metformin  after discharge - continue to monitor blood glucose at SNF at least 2-3 times per day CBG (last 3)  Recent Labs    07/15/22 1608 07/15/22 2136 07/16/22 0724  GLUCAP 110* 104* 163*    Depression, major, single episode, mild  Resumed home  Cymbalta  Essential hypertension Stable. -Resumed losartan  History of CVA with residual deficit Reports residual left-sided deficits from prior stroke.  Today she has 4+5 strength strength in all extremities. Reports generalized weakness. -unfortunately she was not able to tolerate full anticoagulation and now aspirin has to be placed on hold due to subdural hematomas -hold aspirin until she can follow up and discuss with neurosurgery if it will ever be safe to restart it  Discharge Diagnoses:  Principal Problem:   Bilateral subdural hematomas (HCC) Active Problems:   History of CVA with residual deficit   Essential hypertension   Depression, major, single episode, mild (HCC)   Type 2 diabetes mellitus with hyperglycemia, without long-term current use of insulin (HCC)   Seizure disorder (HCC)   Subdural hematoma [facility])   Discharge Instructions: Discharge Instructions     Ambulatory referral to Neurosurgery   Complete by: As directed       Allergies as of 07/16/2022       Reactions   Asa [aspirin]    Diphenhydramine Hcl    Lisinopril    cough   Melatonin    Molds & Smuts    Nsaids    Other    Penicillin G    Penicillins    Tramadol         Medication List     STOP taking these medications    Abrysvo 120 MCG/0.5ML injection Generic drug: RSV bivalent vaccine   aspirin EC 81 MG tablet   oxyCODONE 5 MG immediate release tablet Commonly known as: Roxicodone       TAKE these medications    acetaminophen 500 MG tablet Commonly known as: TYLENOL Take 500-1,000 mg by mouth every 6 (six) hours as needed.   albuterol 108 (90 Base) MCG/ACT inhaler Commonly known as: VENTOLIN HFA Inhale into the lungs every 6 (six) hours as needed for wheezing or shortness of breath.   DULoxetine 20 MG capsule Commonly known as: CYMBALTA TAKE 1 CAPSULE(20 MG) BY MOUTH DAILY What changed: See the new instructions.   famotidine 20 MG tablet Commonly known as:  PEPCID TAKE 1 TABLET BY MOUTH AFTER SUPPER   fluticasone 50 MCG/ACT nasal spray Commonly known as: FLONASE SHAKE LIQUID AND USE 2 SPRAYS IN EACH NOSTRIL DAILY   gabapentin 300 MG capsule Commonly known as: NEURONTIN TAKE 2 CAPSULES(600 MG) BY MOUTH AT BEDTIME What changed: See the new instructions.   hydrocortisone 25 MG suppository Commonly known as: ANUSOL-HC Place 1 suppository (25 mg total) rectally 2 (two) times daily as needed for hemorrhoids or anal itching. What changed:  when to take this reasons to take this   ketoconazole 2 % shampoo Commonly known as: NIZORAL Apply 1 Application topically 2 (two) times a week.   Ketoconazole-Hydrocortisone 2-2.5 % Crea Apply 1 application  topically daily as needed. What changed:  when to take this reasons to take this   levETIRAcetam 500 MG tablet Commonly known as: KEPPRA Take 1 tablet (500 mg total) by mouth 2 (two) times daily.   losartan 25 MG tablet Commonly known as: COZAAR Take 0.5 tablets (12.5 mg total) by mouth daily.   meclizine 25 MG tablet Commonly known as: ANTIVERT Take 1 tablet (25  mg total) by mouth 3 (three) times daily as needed for dizziness.   metFORMIN 1000 MG tablet Commonly known as: GLUCOPHAGE TAKE 1 TABLET(1000 MG) BY MOUTH TWICE DAILY WITH A MEAL What changed: See the new instructions.   montelukast 10 MG tablet Commonly known as: SINGULAIR TAKE 1 TABLET(10 MG) BY MOUTH AT BEDTIME What changed: See the new instructions.   nystatin powder Commonly known as: MYCOSTATIN/NYSTOP Apply topically 3 (three) times daily for 5 days.   pantoprazole 40 MG tablet Commonly known as: PROTONIX TAKE 1 TABLET(40 MG) BY MOUTH DAILY 30 TO 60 MINUTES BEFORE FIRST MEAL OF THE DAY   polyethylene glycol 17 g packet Commonly known as: MIRALAX / GLYCOLAX Take 17 g by mouth daily as needed for mild constipation.   simvastatin 10 MG tablet Commonly known as: ZOCOR Take 1 tablet (10 mg total) by mouth at  bedtime.   vitamin E 180 MG (400 UNITS) capsule Generic drug: vitamin E Take 400 Units by mouth daily.        Contact information for follow-up providers     Dawley, Troy C, DO. Schedule an appointment as soon as possible for a visit in 2 week(s).   Why: Hospital Follow Up Contact information: [address] [place] [address] [place] [address] [phone]              Contact information for after-discharge care     Destination     [facility] Preferred SNF .   Service: Skilled Nursing Contact information: [address] [place] [address] [phone]                    Allergies  Allergen Reactions   Jonne Ply [Aspirin]    Diphenhydramine Hcl    Lisinopril     cough   Melatonin    Molds & Smuts    Nsaids    Other    Penicillin G    Penicillins    Tramadol    Allergies as of 07/16/2022       Reactions   Asa [aspirin]    Diphenhydramine Hcl    Lisinopril    cough   Melatonin    Molds & Smuts    Nsaids    Other    Penicillin G    Penicillins    Tramadol         Medication List     STOP taking these medications    Abrysvo 120 MCG/0.5ML injection Generic drug: RSV bivalent vaccine   aspirin EC 81 MG tablet   oxyCODONE 5 MG immediate release tablet Commonly known as: Roxicodone       TAKE these medications    acetaminophen 500 MG tablet Commonly known as: TYLENOL Take 500-1,000 mg by mouth every 6 (six) hours as needed.   albuterol 108 (90 Base) MCG/ACT inhaler Commonly known as: VENTOLIN HFA Inhale into the lungs every 6 (six) hours as needed for wheezing or shortness of breath.   DULoxetine 20 MG capsule Commonly known as: CYMBALTA TAKE 1 CAPSULE(20 MG) BY MOUTH DAILY What changed: See the new instructions.   famotidine 20 MG tablet Commonly known as: PEPCID TAKE 1 TABLET BY MOUTH AFTER SUPPER   fluticasone 50 MCG/ACT nasal spray Commonly known as: FLONASE SHAKE LIQUID AND USE 2  SPRAYS IN EACH NOSTRIL DAILY   gabapentin 300 MG capsule Commonly known as: NEURONTIN TAKE 2 CAPSULES(600 MG) BY MOUTH AT BEDTIME What changed: See the new instructions.  hydrocortisone 25 MG suppository Commonly known as: ANUSOL-HC Place 1 suppository (25 mg total) rectally 2 (two) times daily as needed for hemorrhoids or anal itching. What changed:  when to take this reasons to take this   ketoconazole 2 % shampoo Commonly known as: NIZORAL Apply 1 Application topically 2 (two) times a week.   Ketoconazole-Hydrocortisone 2-2.5 % Crea Apply 1 application  topically daily as needed. What changed:  when to take this reasons to take this   levETIRAcetam 500 MG tablet Commonly known as: KEPPRA Take 1 tablet (500 mg total) by mouth 2 (two) times daily.   losartan 25 MG tablet Commonly known as: COZAAR Take 0.5 tablets (12.5 mg total) by mouth daily.   meclizine 25 MG tablet Commonly known as: ANTIVERT Take 1 tablet (25 mg total) by mouth 3 (three) times daily as needed for dizziness.   metFORMIN 1000 MG tablet Commonly known as: GLUCOPHAGE TAKE 1 TABLET(1000 MG) BY MOUTH TWICE DAILY WITH A MEAL What changed: See the new instructions.   montelukast 10 MG tablet Commonly known as: SINGULAIR TAKE 1 TABLET(10 MG) BY MOUTH AT BEDTIME What changed: See the new instructions.   nystatin powder Commonly known as: MYCOSTATIN/NYSTOP Apply topically 3 (three) times daily for 5 days.   pantoprazole 40 MG tablet Commonly known as: PROTONIX TAKE 1 TABLET(40 MG) BY MOUTH DAILY 30 TO 60 MINUTES BEFORE FIRST MEAL OF THE DAY   polyethylene glycol 17 g packet Commonly known as: MIRALAX / GLYCOLAX Take 17 g by mouth daily as needed for mild constipation.   simvastatin 10 MG tablet Commonly known as: ZOCOR Take 1 tablet (10 mg total) by mouth at bedtime.   vitamin E 180 MG (400 UNITS) capsule Generic drug: vitamin E Take 400 Units by mouth daily.         Procedures/Studies: CT HEAD WO CONTRAST  Result Date: 07/15/2022 CLINICAL DATA:  Subdural hematoma Follow up on Sub dural hematomas on MRI 07/14/22. EXAM: CT HEAD WITHOUT CONTRAST TECHNIQUE: Contiguous axial images were obtained from the base of the skull through the vertex without intravenous contrast. RADIATION DOSE REDUCTION: This exam was performed according to the departmental dose-optimization program which includes automated exposure control, adjustment of the mA and/or kV according to patient size and/or use of iterative reconstruction technique. COMPARISON:  MRI July 14, 2022. FINDINGS: Brain: Similar small bilateral subdural hematomas, which were better visualized on MRI from yesterday. No mass effect. No evidence of acute large vascular territory infarct, mass lesion, midline shift or hydrocephalus. Small chronic right frontal infarct. Vascular: Calcific atherosclerosis. Skull: No acute fracture. Sinuses/Orbits: Clear sinuses.  No acute orbital findings. Other: No mastoid effusions. IMPRESSION: Similar small bilateral subdural hematomas, which were better visualized on MRI from yesterday. No mass effect. Electronically Signed   By: Feliberto Harts M.D.   On: 07/15/2022 12:48   MR Brain Wo Contrast (neuro protocol)  Result Date: 07/14/2022 CLINICAL DATA:  Neuro deficit, acute, stroke suspected. Weakness. Fall. EXAM: MRI HEAD WITHOUT CONTRAST TECHNIQUE: Multiplanar, multiecho pulse sequences of the brain and surrounding structures were obtained without intravenous contrast. COMPARISON:  Head CT 05/31/2022 and MRI 03/06/2020 FINDINGS: Brain: Small subdural hematomas over both cerebral convexities measure up to 2 mm in thickness on the right and 3 mm on the left without mass effect. These were not evident on last month's CT. There are also small subdural hematomas in the posterior fossa measuring up to 2 mm in thickness on the right and 5 mm on the left  without mass effect. No acute infarct,  mass, or midline shift is evident. A small focus of cortical encephalomalacia is again noted in the parasagittal posterior right frontal lobe with associated chronic blood products, and there are also chronic blood products overlying the parasagittal left frontal lobe in this region as shown on the prior MRI. Mild cerebral atrophy is within normal limits for age. Cerebral white matter T2 hyperintensities are unchanged and nonspecific but compatible with minimal chronic small vessel ischemic disease. Vascular: Major intracranial vascular flow voids are preserved. Skull and upper cervical spine: No suspicious marrow lesion. Sinuses/Orbits: Bilateral cataract extraction. Paranasal sinuses and mastoid air cells are clear. Other: None. Critical Value/emergent results were called by telephone at the time of interpretation on 07/14/2022 at 2:22 pm to Dr. Gerhard Munch, who verbally acknowledged these results. IMPRESSION: 1. Small bilateral subdural hematomas over the cerebral convexities and in the posterior fossa. No mass effect. 2. No acute infarct. 3. Small chronic right frontal infarct. Electronically Signed   By: Sebastian Ache M.D.   On: 07/14/2022 14:25   DG Chest 2 View  Result Date: 07/14/2022 CLINICAL DATA:  Weakness EXAM: CHEST - 2 VIEW COMPARISON:  X-ray 05/31/2022 FINDINGS: No consolidation, pneumothorax or effusion. No edema. Normal cardiopericardial silhouette. Overlapping cardiac leads. Calcified right midlung nodule identified consistent with old granulomatous disease. Stable sclerotic focus overlying the left humeral head. IMPRESSION: No acute cardiopulmonary disease. Electronically Signed   By: Karen Kays M.D.   On: 07/14/2022 12:15   ECHOCARDIOGRAM COMPLETE  Result Date: 06/29/2022    ECHOCARDIOGRAM REPORT   Patient Name:   Anna Robbins Date of Exam: 06/29/2022 Medical Rec #:  [id number]        Height:       65.0 in Accession #:    [id number]       Weight:       202.0 lb Date of Birth:   July 15, 1932         BSA:          1.987 m Patient Age:    89 years         BP:           157/81 mmHg Patient Gender: F                HR:           72 bpm. Exam Location:  [address] Procedure: 2D Echo, Cardiac Doppler and Color Doppler Indications:    R01.1 Murmur  History:        Patient has no prior history of Echocardiogram examinations.                 Risk Factors:Diabetes.  Sonographer:    Daphine Deutscher RDCS Referring Phys: [id number] HEATHER E P[place]ON IMPRESSIONS  1. Left ventricular ejection fraction, by estimation, is 60 to 65%. The left ventricle has normal function. The left ventricle has no regional wall motion abnormalities. Left ventricular diastolic parameters are consistent with Grade I diastolic dysfunction (impaired relaxation).  2. Right ventricular systolic function is normal. The right ventricular size is normal.  3. Left atrial size was moderately dilated.  4. Right atrial size was mild to moderately dilated.  5. The mitral valve is degenerative. Trivial mitral valve regurgitation. No evidence of mitral stenosis.  6. The aortic valve is normal in structure. There is moderate calcification of the aortic valve. Aortic valve regurgitation is mild. Mild aortic valve stenosis. Aortic regurgitation PHT measures 455 msec. Aortic valve area,  by VTI measures 1.21 cm. Aortic valve mean gradient measures 9.2 mmHg. Aortic valve Vmax measures 1.96 m/s.  7. The inferior vena cava is normal in size with greater than 50% respiratory variability, suggesting right atrial pressure of 3 mmHg. FINDINGS  Left Ventricle: Left ventricular ejection fraction, by estimation, is 60 to 65%. The left ventricle has normal function. The left ventricle has no regional wall motion abnormalities. The left ventricular internal cavity size was normal in size. There is  no left ventricular hypertrophy. Left ventricular diastolic parameters are consistent with Grade I diastolic dysfunction (impaired relaxation). Right  Ventricle: The right ventricular size is normal. No increase in right ventricular wall thickness. Right ventricular systolic function is normal. Left Atrium: Left atrial size was moderately dilated. Right Atrium: Right atrial size was mild to moderately dilated. Pericardium: There is no evidence of pericardial effusion. Mitral Valve: The mitral valve is degenerative in appearance. Mild to moderate mitral annular calcification. Trivial mitral valve regurgitation. No evidence of mitral valve stenosis. Tricuspid Valve: The tricuspid valve is normal in structure. Tricuspid valve regurgitation is trivial. No evidence of tricuspid stenosis. Aortic Valve: The aortic valve is normal in structure. There is moderate calcification of the aortic valve. Aortic valve regurgitation is mild. Aortic regurgitation PHT measures 455 msec. Mild aortic stenosis is present. Aortic valve mean gradient measures 9.2 mmHg. Aortic valve peak gradient measures 15.3 mmHg. Aortic valve area, by VTI measures 1.21 cm. Pulmonic Valve: The pulmonic valve was normal in structure. Pulmonic valve regurgitation is not visualized. No evidence of pulmonic stenosis. Aorta: The aortic root is normal in size and structure. Venous: The inferior vena cava is normal in size with greater than 50% respiratory variability, suggesting right atrial pressure of 3 mmHg. IAS/Shunts: No atrial level shunt detected by color flow Doppler.  LEFT VENTRICLE PLAX 2D LVIDd:         4.30 cm   Diastology LVIDs:         2.90 cm   LV e' medial:    4.73 cm/s LV PW:         0.70 cm   LV E/e' medial:  19.5 LV IVS:        1.10 cm   LV e' lateral:   6.40 cm/s LVOT diam:     1.70 cm   LV E/e' lateral: 14.4 LV SV:         49 LV SV Index:   25 LVOT Area:     2.27 cm  RIGHT VENTRICLE             IVC RV Basal diam:  4.80 cm     IVC diam: 1.70 cm RV S prime:     15.35 cm/s TAPSE (M-mode): 2.7 cm LEFT ATRIUM             Index        RIGHT ATRIUM           Index LA diam:        4.20 cm 2.11  cm/m   RA Area:     14.70 cm LA Vol (A2C):   50.5 ml 25.42 ml/m  RA Volume:   39.00 ml  19.63 ml/m LA Vol (A4C):   57.2 ml 28.79 ml/m LA Biplane Vol: 55.8 ml 28.09 ml/m  AORTIC VALVE AV Area (Vmax):    1.21 cm AV Area (Vmean):   1.10 cm AV Area (VTI):     1.21 cm AV Vmax:  195.60 cm/s AV Vmean:          140.800 cm/s AV VTI:            0.404 m AV Peak Grad:      15.3 mmHg AV Mean Grad:      9.2 mmHg LVOT Vmax:         104.00 cm/s LVOT Vmean:        68.300 cm/s LVOT VTI:          0.216 m LVOT/AV VTI ratio: 0.53 AI PHT:            455 msec  AORTA Ao Root diam: 3.10 cm Ao Asc diam:  3.50 cm MITRAL VALVE MV Area (PHT): 2.58 cm     SHUNTS MV Decel Time: 294 msec     Systemic VTI:  0.22 m MV E velocity: 92.00 cm/s   Systemic Diam: 1.70 cm MV A velocity: 120.50 cm/s MV E/A ratio:  0.76 Arvilla Meres MD Electronically signed by Arvilla Meres MD Signature Date/Time: 06/29/2022/3:51:23 PM    Final      Subjective: Pt has been up to chair in room, feeling well, agreeable to SNF.    Discharge Exam: Vitals:   07/15/22 2130 07/16/22 0539  BP: (!) 148/62 (!) 137/59  Pulse: 64 64  Resp: 18 16  Temp: 98 F (36.7 C) 97.6 F (36.4 C)  SpO2: 96% 94%   Vitals:   07/15/22 0353 07/15/22 1420 07/15/22 2130 07/16/22 0539  BP: (!) 151/64 (!) 144/67 (!) 148/62 (!) 137/59  Pulse: 64 72 64 64  Resp: 18 19 18 16   Temp: 98.3 F (36.8 C) 98.3 F (36.8 C) 98 F (36.7 C) 97.6 F (36.4 C)  TempSrc:  Oral Oral Oral  SpO2: 97% 97% 96% 94%  Weight:      Height:       General exam: Appears calm and comfortable  Respiratory system: Clear to auscultation. Respiratory effort normal. Cardiovascular system: normal S1 & S2 heard. No JVD, murmurs, rubs, gallops or clicks. No pedal edema. Gastrointestinal system: Abdomen is nondistended, soft and nontender. No organomegaly or masses felt. Normal bowel sounds heard. Central nervous system: Alert and oriented. No focal neurological deficits. Extremities:  Symmetric 5 x 5 power. Skin: No rashes, lesions or ulcers. Psychiatry: Judgement and insight appear normal. Mood & affect appropriate.    The results of significant diagnostics from this hospitalization (including imaging, microbiology, ancillary and laboratory) are listed below for reference.     Microbiology: No results found for this or any previous visit (from the past 240 hour(s)).   Labs: BNP (last 3 results) No results for input(s): "BNP" in the last 8760 hours. Basic Metabolic Panel: Recent Labs  Lab 07/14/22 1144  NA 137  K 4.3  CL 101  CO2 27  GLUCOSE 237*  BUN 25*  CREATININE 1.01*  CALCIUM 9.3   Liver Function Tests: Recent Labs  Lab 07/14/22 1144  AST 13*  ALT 10  ALKPHOS 39  BILITOT 0.7  PROT 6.8  ALBUMIN 4.0   No results for input(s): "LIPASE", "AMYLASE" in the last 168 hours. No results for input(s): "AMMONIA" in the last 168 hours. CBC: Recent Labs  Lab 07/14/22 1144  WBC 5.1  NEUTROABS 2.9  HGB 10.9*  HCT 34.7*  MCV 93.3  PLT 244   Cardiac Enzymes: No results for input(s): "CKTOTAL", "CKMB", "CKMBINDEX", "TROPONINI" in the last 168 hours. BNP: Invalid input(s): "POCBNP" CBG: Recent Labs  Lab 07/15/22 0722 07/15/22 1104  07/15/22 1608 07/15/22 2136 07/16/22 0724  GLUCAP 180* 233* 110* 104* 163*   D-Dimer No results for input(s): "DDIMER" in the last 72 hours. Hgb A1c No results for input(s): "HGBA1C" in the last 72 hours. Lipid Profile No results for input(s): "CHOL", "HDL", "LDLCALC", "TRIG", "CHOLHDL", "LDLDIRECT" in the last 72 hours. Thyroid function studies No results for input(s): "TSH", "T4TOTAL", "T3FREE", "THYROIDAB" in the last 72 hours.  Invalid input(s): "FREET3" Anemia work up No results for input(s): "VITAMINB12", "FOLATE", "FERRITIN", "TIBC", "IRON", "RETICCTPCT" in the last 72 hours. Urinalysis    Component Value Date/Time   COLORURINE YELLOW 07/15/2022 1016   APPEARANCEUR HAZY (A) 07/15/2022 1016    LABSPEC 1.006 07/15/2022 1016   PHURINE 6.0 07/15/2022 1016   GLUCOSEU 50 (A) 07/15/2022 1016   HGBUR SMALL (A) 07/15/2022 1016   BILIRUBINUR NEGATIVE 07/15/2022 1016   BILIRUBINUR negative 03/05/2021 1423   KETONESUR NEGATIVE 07/15/2022 1016   PROTEINUR NEGATIVE 07/15/2022 1016   UROBILINOGEN 0.2 03/05/2021 1423   NITRITE NEGATIVE 07/15/2022 1016   LEUKOCYTESUR MODERATE (A) 07/15/2022 1016   Sepsis Labs Recent Labs  Lab 07/14/22 1144  WBC 5.1   Microbiology No results found for this or any previous visit (from the past 240 hour(s)).  Time coordinating discharge: 47 mins  SIGNED:  Standley Dakins, MD  [facility] 07/16/2022, 10:50 AM How to contact the [facility] Attending or Consulting provider 7A - 7P or covering provider during after hours 7P -7A, for this patient?  Check the care team in [facility] and look for a) attending/consulting [facility] provider listed and b) the Access [facility] team listed Log into [email or web address] and use [place] universal password to access. If you do not have the password, please contact the hospital operator. Locate the [facility] provider you are looking for under [facility] and page to a number that you can be directly reached. If you still have difficulty reaching the provider, please page the [facility] (Director on Call) for the Hospitalists listed on amion for assistance.

## 2022-07-16 NOTE — Telephone Encounter (Signed)
Tammy w/ [facility] called to see if it was fine for her to move forward with current medications listed in Epic or should any changes be made to the medications. Patient she a new admit to [facility]. Tammy call back number is [phone].

## 2022-07-16 NOTE — Telephone Encounter (Signed)
D/W Tammy

## 2022-07-17 ENCOUNTER — Encounter: Payer: Self-pay | Admitting: Internal Medicine

## 2022-07-17 ENCOUNTER — Non-Acute Institutional Stay (SKILLED_NURSING_FACILITY): Payer: Medicare Other | Admitting: Internal Medicine

## 2022-07-17 DIAGNOSIS — E1151 Type 2 diabetes mellitus with diabetic peripheral angiopathy without gangrene: Secondary | ICD-10-CM | POA: Insufficient documentation

## 2022-07-17 DIAGNOSIS — I10 Essential (primary) hypertension: Secondary | ICD-10-CM | POA: Diagnosis not present

## 2022-07-17 DIAGNOSIS — S065XAA Traumatic subdural hemorrhage with loss of consciousness status unknown, initial encounter: Secondary | ICD-10-CM | POA: Diagnosis not present

## 2022-07-17 DIAGNOSIS — N1831 Chronic kidney disease, stage 3a: Secondary | ICD-10-CM | POA: Diagnosis not present

## 2022-07-17 NOTE — Assessment & Plan Note (Signed)
Hospitalized 6/18 - 07/16/2022 with small bilateral subdural hematomas.  Slight rise in creatinine to 1.01 with GFR 54, down from prior value of 60.  CKD is now stage IIIa.  Med list reviewed; no change in meds indicated.  Monitor renal function at SNF.

## 2022-07-17 NOTE — Progress Notes (Signed)
NURSING HOME LOCATION:  [facility] ROOM NUMBER:  129 P  CODE STATUS:  DNR  PCP: Christel Mormon MD  This is a comprehensive admission note to this SNFperformed on this date less than 30 days from date of admission. Included are preadmission medical/surgical history; reconciled medication list; family history; social history and comprehensive review of systems.  Corrections and additions to the records were documented. Comprehensive physical exam was also performed. Additionally a clinical summary was entered for each active diagnosis pertinent to this admission in the Problem List to enhance continuity of care.  HPI: She was hospitalized 6/18 - 07/16/2022 presenting to the ED with history of weakness for 2 days PTA with delayed speech and word finding difficulty.  At baseline she describes weakness in the [place] but for a few of days prior to admission she had noted weakness on the right side as well. These findings were in the context of a significant fall 06/15/2022.  CT at that time revealed no acute changes.  Since that time she has had intermittent headaches.  She was on aspirin but no other blood thinners. MRI 6/18 revealed small bilateral subdural hematomas over the cerebral convexities.  Neurosurgery was consulted; Dr. Jake Samples recommended no emergent surgery.  Repeat head CT 6/19 revealed stable findings with small hemorrhages.  Aspirin was held. Stable normochromic, normocytic anemia was present with H/H of 10.9/34.7.  There has been slight decrease in renal function with a bump in creatinine to 1.01 and with a GFR of 54, down from a prior value of greater than 60.  Glucoses while hospitalized ranged from a low of 104 up to 233.  The most recent A1c was 7.2% on 03/19/2022. PT/OT recommended SNF placement for rehab.   Past medical and surgical history: Includes history of nephrolithiasis, history of PTE, history of seizure disorder, history of stroke, essential hypertension,  CAD, and diabetes with CKD stage III and vascular complications. Surgeries and procedures include appendectomy and hysterectomy.  Social history: Non-smoker, nondrinker.  Family history: Noncontributory due to advanced age.   Review of systems: She comprehends the diagnoses and recommendations during her hospitalization.  She described it as "blood bleed either side which is stable."  She describes a mechanical fall on 5/20 without cardiac or neurologic prodrome.  Apparently she was reaching for the dog's water bowl while employing a walker and fell forward striking above the bridge of the nose.  She continues to have intermittent headaches behind the right eye which radiate to the forehead bilaterally.  She describes it as "kind of throbbing".  It does last minutes and responds to Tylenol.  He has chronic weakness in the left lower extremity.  She describes nonexertional chest pain for which she saw her cardiologist and for which an echocardiogram was performed.  She has numbness and tingling in the right hand.  She has not been checking her glucoses at home.  She does still have frequent loose stool but not frank diarrhea.  Constitutional: No fever, significant weight change, fatigue  Eyes: No redness, discharge, pain, vision change ENT/mouth: No nasal congestion, purulent discharge, earache, change in hearing, sore throat  Cardiovascular: No chest pain, palpitations, paroxysmal nocturnal dyspnea, claudication, edema  Respiratory: No cough, sputum production, hemoptysis, DOE, significant snoring, apnea Gastrointestinal: No heartburn, dysphagia, abdominal pain, nausea /vomiting, rectal bleeding, melena, change in bowels Genitourinary: No dysuria, hematuria, pyuria, incontinence, nocturia Musculoskeletal: No joint stiffness, joint swelling, weakness, pain Dermatologic: No rash, pruritus, change in appearance of skin Neurologic:  No dizziness, headache, syncope, seizures, numbness,  tingling Psychiatric: No significant anxiety, depression, insomnia, anorexia Endocrine: No change in hair/skin/nails, excessive thirst, excessive hunger, excessive urination  Hematologic/lymphatic: No significant bruising, lymphadenopathy, abnormal bleeding Allergy/immunology: No itchy/watery eyes, significant sneezing, urticaria, angioedema  Physical exam:  Pertinent or positive findings: She appears her age and adequately nourished.  Her responses are slow but elaborative.  Hair is thin over the crown.  She has bilateral ptosis.  Grade 1 systolic murmur is present.  Pedal pulses are decreased to palpation.  She has hyperpigmentation over the left lateral calf as well as to her greater extent over the right medial calf.  There is an oblique ecchymotic lesion over the right upper extremity.  Opposition to strength is good except in the left lower extremity. General appearance: Adequately nourished; no acute distress, increased work of breathing is present.   Lymphatic: No lymphadenopathy about the head, neck, axilla. Eyes: No conjunctival inflammation or lid edema is present. There is no scleral icterus. Ears:  External ear exam shows no significant lesions or deformities.   Nose:  External nasal examination shows no deformity or inflammation. Nasal mucosa are pink and moist without lesions, exudates Oral exam: Lips and gums are healthy appearing.There is no oropharyngeal erythema or exudate. Neck:  No thyromegaly, masses, tenderness noted.    Heart:  Normal rate and regular rhythm. S1 and S2 normal without gallop, murmur, click, rub.  Lungs: Chest clear to auscultation without wheezes, rhonchi, rales, rubs. Abdomen: Bowel sounds are normal.  Abdomen is soft and nontender with no organomegaly, hernias, masses. GU: Deferred  Extremities:  No cyanosis, clubbing, edema. Neurologic exam:  Strength equal  in upper & lower extremities. Balance, Rhomberg, finger to nose testing could not be completed  due to clinical state Deep tendon reflexes are equal Skin: Warm & dry w/o tenting. No significant lesions or rash.  See clinical summary under each active problem in the Problem List with associated updated therapeutic plan

## 2022-07-17 NOTE — Patient Instructions (Signed)
See assessment and plan under each diagnosis in the problem list and acutely for this visit 

## 2022-07-17 NOTE — Assessment & Plan Note (Signed)
PT/OT at SNF.  Continue to hold aspirin and monitor for any new neurologic deficits.  Neurosurgery follow-up as scheduled.

## 2022-07-17 NOTE — Assessment & Plan Note (Signed)
Not checking glucoses @ home. Most recent A1c was 7.2% on 03/19/2022.  While hospitalized for posttraumatic subdural hematoma; glucose ranged from a low of 104 up to 233. A1c should be updated.

## 2022-07-17 NOTE — Assessment & Plan Note (Signed)
Today's blood pressure 150/60 is an outlier.  Previous recorded blood pressures at the SNF were 122/70 and 130/74.  Blood pressure monitor will continue and medications adjusted based on the average rather than  outliers.

## 2022-07-23 ENCOUNTER — Ambulatory Visit: Payer: Medicare Other | Admitting: Internal Medicine

## 2022-07-27 ENCOUNTER — Other Ambulatory Visit: Payer: Self-pay | Admitting: *Deleted

## 2022-07-27 NOTE — Patient Outreach (Signed)
Per [facility], Mrs. Frydrych resides in [place] skilled nursing facility. Screening for potential [facility] care coordination services as benefit of health plan and Primary Care Provider.   Collaboration with [facility] social worker. Anticipated transition plan is long term care. Family interested in [employer].  No identifiable care coordination needs since plan is for long term care.   Raiford Noble, MSN, RN,BSN [facility] Post Acute Care Coordinator [phone] (Direct dial)

## 2022-07-28 ENCOUNTER — Encounter: Payer: Self-pay | Admitting: Adult Health

## 2022-07-28 NOTE — Progress Notes (Signed)
Location:  [facility] Room Number: 129 P Place of Service:  SNF (31)   CODE STATUS: DNR  Allergies  Allergen Reactions   Asa [Aspirin]    Diphenhydramine Hcl    Lisinopril     cough   Melatonin    Molds & Smuts    Nsaids    Other    Penicillin G    Penicillins    Tramadol     Chief Complaint  Patient presents with   Acute Visit    Confusion     HPI:    Past Medical History:  Diagnosis Date   Asthma    Diabetes mellitus without complication (HCC)    Kidney stones 07/2017   required sx   Pulmonary emboli (HCC) 06/2017   Seizures (HCC)    Stroke [facility])     Past Surgical History:  Procedure Laterality Date   ABDOMINAL HYSTERECTOMY     APPENDECTOMY     thumb surgery     TONSILLECTOMY      Social History   Socioeconomic History   Marital status: Widowed    Spouse name: Not on file   Number of children: Not on file   Years of education: Not on file   Highest education level: Not on file  Occupational History   Not on file  Tobacco Use   Smoking status: Never   Smokeless tobacco: Never  Vaping Use   Vaping Use: Never used  Substance and Sexual Activity   Alcohol use: Not Currently   Drug use: Not Currently   Sexual activity: Not Currently  Other Topics Concern   Not on file  Social History Narrative   Lives with daughter and son in law and their 2 kids, also son in law's mother.   Left Handed   Driinks 1-2 cups caffeine weekly   Social Determinants of Health   Financial Resource Strain: Low Risk  (02/18/2022)   Overall Financial Resource Strain (CARDIA)    Difficulty of Paying Living Expenses: Not hard at all  Food Insecurity: No Food Insecurity (07/14/2022)   Hunger Vital Sign    Worried About Running Out of Food in the Last Year: Never true    Ran Out of Food in the Last Year: Never true  Transportation Needs: No Transportation Needs (07/14/2022)   PRAPARE - Administrator, Civil Service (Medical): No     Lack of Transportation (Non-Medical): No  Physical Activity: Inactive (04/29/2022)   Exercise Vital Sign    Days of Exercise per Week: 0 days    Minutes of Exercise per Session: 0 min  Stress: Stress Concern Present (04/29/2022)   [employer] of Occupational Health - Occupational Stress Questionnaire    Feeling of Stress : To some extent  Social Connections: Moderately Integrated (02/18/2022)   Social Connection and Isolation Panel [NHANES]    Frequency of Communication with Friends and Family: More than three times a week    Frequency of Social Gatherings with Friends and Family: More than three times a week    Attends Religious Services: More than 4 times per year    Active Member of [employer] or Organizations: Yes    Attends Banker Meetings: More than 4 times per year    Marital Status: Widowed  Intimate Partner Violence: Not At Risk (07/14/2022)   Humiliation, Afraid, Rape, and Kick questionnaire    Fear of Current or Ex-Partner: No    Emotionally Abused: No  Physically Abused: No    Sexually Abused: No   History reviewed. No pertinent family history.    VITAL SIGNS BP (!) 128/56   Pulse 88   Temp 97.6 F (36.4 C)   Resp 20   Ht 5\' 5"  (1.651 m)   Wt 200 lb (90.7 kg)   SpO2 98%   BMI 33.28 kg/m   Outpatient Encounter Medications as of 07/28/2022  Medication Sig   acetaminophen (TYLENOL) 500 MG tablet Take 500 mg by mouth every 6 (six) hours as needed.   albuterol (VENTOLIN HFA) 108 (90 Base) MCG/ACT inhaler Inhale into the lungs every 6 (six) hours as needed for wheezing or shortness of breath.   DULoxetine (CYMBALTA) 20 MG capsule TAKE 1 CAPSULE(20 MG) BY MOUTH DAILY   famotidine (PEPCID) 20 MG tablet TAKE 1 TABLET BY MOUTH AFTER SUPPER   fluticasone (FLONASE) 50 MCG/ACT nasal spray SHAKE LIQUID AND USE 2 SPRAYS IN EACH NOSTRIL DAILY   gabapentin (NEURONTIN) 300 MG capsule TAKE 2 CAPSULES(600 MG) BY MOUTH AT BEDTIME   hydrocortisone (ANUSOL-HC) 25 MG  suppository Place 1 suppository (25 mg total) rectally 2 (two) times daily as needed for hemorrhoids or anal itching.   ketoconazole (NIZORAL) 2 % shampoo Apply 1 Application topically 2 (two) times a week.   Ketoconazole-Hydrocortisone 2-2.5 % CREA Apply 1 application  topically daily as needed.   levETIRAcetam (KEPPRA) 500 MG tablet Take 1 tablet (500 mg total) by mouth 2 (two) times daily.   losartan (COZAAR) 25 MG tablet Take 0.5 tablets (12.5 mg total) by mouth daily.   meclizine (ANTIVERT) 25 MG tablet Take 1 tablet (25 mg total) by mouth 3 (three) times daily as needed for dizziness.   metFORMIN (GLUCOPHAGE) 1000 MG tablet TAKE 1 TABLET(1000 MG) BY MOUTH TWICE DAILY WITH A MEAL   montelukast (SINGULAIR) 10 MG tablet TAKE 1 TABLET(10 MG) BY MOUTH AT BEDTIME   pantoprazole (PROTONIX) 40 MG tablet TAKE 1 TABLET(40 MG) BY MOUTH DAILY 30 TO 60 MINUTES BEFORE FIRST MEAL OF THE DAY   polyethylene glycol (MIRALAX / GLYCOLAX) 17 g packet Take 17 g by mouth daily as needed for mild constipation.   simvastatin (ZOCOR) 10 MG tablet Take 1 tablet (10 mg total) by mouth at bedtime.   vitamin E 180 MG (400 UNITS) capsule Take 400 Units by mouth daily.   No facility-administered encounter medications on file as of 07/28/2022.     SIGNIFICANT DIAGNOSTIC EXAMS       ASSESSMENT/ PLAN:     Synthia Innocent NP [facility]  Contact [phone] Monday through Friday 8am- 5pm  After hours call [phone]

## 2022-07-29 DIAGNOSIS — S065XAA Traumatic subdural hemorrhage with loss of consciousness status unknown, initial encounter: Secondary | ICD-10-CM | POA: Diagnosis not present

## 2022-07-31 ENCOUNTER — Telehealth: Payer: Self-pay | Admitting: *Deleted

## 2022-07-31 NOTE — Progress Notes (Signed)
This encounter was created in error - please disregard.

## 2022-07-31 NOTE — Progress Notes (Signed)
  Care Coordination Note  07/31/2022 Name: Sherrine Bowlen MRN: [id number] DOB: Sep 04, 1932  Maryanna Dudik is a 89 y.o. year old female who is a primary care patient of Billie Lade, MD and is actively engaged with the care management team. Lorna Few Licensed Clinical Social Worker called and left message for care guide to cancel upcoming call with Kada Riofrio due to living in a SNF.    Follow up plan: Patient declines further follow up and engagement by the care management team. Appropriate care team members and provider have been notified via electronic communication.   [facility]  Care Coordination Care Guide  Direct Dial: [phone]

## 2022-08-04 ENCOUNTER — Ambulatory Visit (HOSPITAL_COMMUNITY): Payer: Medicare Other | Admitting: Physical Therapy

## 2022-08-07 ENCOUNTER — Other Ambulatory Visit: Payer: Self-pay | Admitting: *Deleted

## 2022-08-07 ENCOUNTER — Non-Acute Institutional Stay (SKILLED_NURSING_FACILITY): Payer: Medicare Other | Admitting: Adult Health

## 2022-08-07 ENCOUNTER — Encounter: Payer: Self-pay | Admitting: Adult Health

## 2022-08-07 DIAGNOSIS — F320 Major depressive disorder, single episode, mild: Secondary | ICD-10-CM | POA: Diagnosis not present

## 2022-08-07 DIAGNOSIS — E1151 Type 2 diabetes mellitus with diabetic peripheral angiopathy without gangrene: Secondary | ICD-10-CM | POA: Diagnosis not present

## 2022-08-07 DIAGNOSIS — S065XAA Traumatic subdural hemorrhage with loss of consciousness status unknown, initial encounter: Secondary | ICD-10-CM

## 2022-08-07 NOTE — Patient Outreach (Signed)
Post-Acute Care Coordinator follow up. Mrs. Anna Robbins resides in [place] skilled nursing facility. She was active with [facility] care coordination team prior.   Previous update from [place] social worker indicated they have not heard back from [facility] for placement. Indicated primary contact person is daughter in law Anna Robbins.   Telephone call made to Anna Robbins (dtr in law/DPR) [phone]. Patient identifiers confirmed. Anna Robbins reports transition plans depend on how well [name] is doing with therapy. States she has meeting with [facility] therapy team today. States [name] will need to be able to walk to the bathroom without assistance before returning home. States there will be less support in the home if [name] should return home post [facility] stay.   Anna Robbins states she is still exploring other options for long term care placement such as [facility] and [facility]. States if [name] should return home, it will likely be for short term. States at this moment, transition plans are still pending progress and how much [name] can do for herself.   Discussed writer will continue to follow along. Will send Anna Robbins writer's contact information. Will update [facility] care coordination as necessary.   Raiford Noble, MSN, RN,BSN [facility] Post Acute Care Coordinator [phone] (Direct dial)

## 2022-08-07 NOTE — Progress Notes (Signed)
Location:  [facility] Room Number: 129 Place of Service:  SNF (31)   CODE STATUS: dnr   Allergies  Allergen Reactions   Asa [Aspirin]    Diphenhydramine Hcl    Lisinopril     cough   Melatonin    Molds & Smuts    Nsaids    Other    Penicillin G    Penicillins    Tramadol     Chief Complaint  Patient presents with   Acute Visit    Care plan meeting     HPI:  We have come together for her care plan meeting. Family present. BIMS 12/15 mood 3/30: depression. She has had no further falls since admission. She requires max to dependent assist with her adls. She is frequently incontinent of bladder and bowel. Dietary:  weight is 198.8 pounds regular diet feeds self; appetite 75-100%. Therapy: doing well supervision upper body; mod assist lower body min assist to mod assist for transfers. Safety is priority for returning back home.ambulating very short distances 80 feet with rolling walker with min assist; mod assist for brp . Activities: family would like to bring dogs in for visits. Not into group activities. We have discussed her advanced directives and have filled out a MOST form.  She continues to be followed for her chronic illnesses including:  Bilateral subdural hematomas   Depression major single episode mild Type 2 diabetes mellitus with peripheral vascular complications .   Past Medical History:  Diagnosis Date   Asthma    Diabetes mellitus without complication (HCC)    Kidney stones 07/2017   required sx   Pulmonary emboli (HCC) 06/2017   Seizures (HCC)    Stroke [facility])     Past Surgical History:  Procedure Laterality Date   ABDOMINAL HYSTERECTOMY     APPENDECTOMY     thumb surgery     TONSILLECTOMY      Social History   Socioeconomic History   Marital status: Widowed    Spouse name: Not on file   Number of children: Not on file   Years of education: Not on file   Highest education level: Not on file  Occupational History   Not  on file  Tobacco Use   Smoking status: Never   Smokeless tobacco: Never  Vaping Use   Vaping status: Never Used  Substance and Sexual Activity   Alcohol use: Not Currently   Drug use: Not Currently   Sexual activity: Not Currently  Other Topics Concern   Not on file  Social History Narrative   Lives with daughter and son in law and their 2 kids, also son in law's mother.   Left Handed   Driinks 1-2 cups caffeine weekly   Social Determinants of Health   Financial Resource Strain: Low Risk  (02/18/2022)   Overall Financial Resource Strain (CARDIA)    Difficulty of Paying Living Expenses: Not hard at all  Food Insecurity: No Food Insecurity (07/14/2022)   Hunger Vital Sign    Worried About Running Out of Food in the Last Year: Never true    Ran Out of Food in the Last Year: Never true  Transportation Needs: No Transportation Needs (07/14/2022)   PRAPARE - Administrator, Civil Service (Medical): No    Lack of Transportation (Non-Medical): No  Physical Activity: Inactive (04/29/2022)   Exercise Vital Sign    Days of Exercise per Week: 0 days    Minutes of Exercise  per Session: 0 min  Stress: Stress Concern Present (04/29/2022)   [employer] of Occupational Health - Occupational Stress Questionnaire    Feeling of Stress : To some extent  Social Connections: Moderately Integrated (02/18/2022)   Social Connection and Isolation Panel [NHANES]    Frequency of Communication with Friends and Family: More than three times a week    Frequency of Social Gatherings with Friends and Family: More than three times a week    Attends Religious Services: More than 4 times per year    Active Member of [employer] or Organizations: Yes    Attends Banker Meetings: More than 4 times per year    Marital Status: Widowed  Intimate Partner Violence: Not At Risk (07/14/2022)   Humiliation, Afraid, Rape, and Kick questionnaire    Fear of Current or Ex-Partner: No    Emotionally  Abused: No    Physically Abused: No    Sexually Abused: No   No family history on file.    VITAL SIGNS BP 126/66   Pulse 75   Temp 97.7 F (36.5 C)   Resp 20   Ht 5\' 5"  (1.651 m)   Wt 198 lb 12.8 oz (90.2 kg)   SpO2 94%   BMI 33.08 kg/m   Outpatient Encounter Medications as of 08/07/2022  Medication Sig   acetaminophen (TYLENOL) 500 MG tablet Take 500 mg by mouth every 6 (six) hours as needed.   albuterol (VENTOLIN HFA) 108 (90 Base) MCG/ACT inhaler Inhale into the lungs every 6 (six) hours as needed for wheezing or shortness of breath.   DULoxetine (CYMBALTA) 20 MG capsule TAKE 1 CAPSULE(20 MG) BY MOUTH DAILY   famotidine (PEPCID) 20 MG tablet TAKE 1 TABLET BY MOUTH AFTER SUPPER   fluticasone (FLONASE) 50 MCG/ACT nasal spray SHAKE LIQUID AND USE 2 SPRAYS IN EACH NOSTRIL DAILY   gabapentin (NEURONTIN) 300 MG capsule TAKE 2 CAPSULES(600 MG) BY MOUTH AT BEDTIME   hydrocortisone (ANUSOL-HC) 25 MG suppository Place 1 suppository (25 mg total) rectally 2 (two) times daily as needed for hemorrhoids or anal itching.   ketoconazole (NIZORAL) 2 % shampoo Apply 1 Application topically 2 (two) times a week.   Ketoconazole-Hydrocortisone 2-2.5 % CREA Apply 1 application  topically daily as needed.   levETIRAcetam (KEPPRA) 500 MG tablet Take 1 tablet (500 mg total) by mouth 2 (two) times daily.   losartan (COZAAR) 25 MG tablet Take 0.5 tablets (12.5 mg total) by mouth daily.   meclizine (ANTIVERT) 25 MG tablet Take 1 tablet (25 mg total) by mouth 3 (three) times daily as needed for dizziness.   metFORMIN (GLUCOPHAGE) 1000 MG tablet TAKE 1 TABLET(1000 MG) BY MOUTH TWICE DAILY WITH A MEAL   montelukast (SINGULAIR) 10 MG tablet TAKE 1 TABLET(10 MG) BY MOUTH AT BEDTIME   pantoprazole (PROTONIX) 40 MG tablet TAKE 1 TABLET(40 MG) BY MOUTH DAILY 30 TO 60 MINUTES BEFORE FIRST MEAL OF THE DAY   polyethylene glycol (MIRALAX / GLYCOLAX) 17 g packet Take 17 g by mouth daily as needed for mild  constipation.   simvastatin (ZOCOR) 10 MG tablet Take 1 tablet (10 mg total) by mouth at bedtime.   vitamin E 180 MG (400 UNITS) capsule Take 400 Units by mouth daily.   No facility-administered encounter medications on file as of 08/07/2022.     SIGNIFICANT DIAGNOSTIC EXAMS  Review of Systems  Constitutional:  Negative for malaise/fatigue.  Respiratory:  Negative for cough and shortness of breath.   Cardiovascular:  Negative for chest pain, palpitations and leg swelling.  Gastrointestinal:  Negative for abdominal pain, constipation and heartburn.  Musculoskeletal:  Negative for back pain, joint pain and myalgias.  Skin: Negative.   Neurological:  Negative for dizziness.  Psychiatric/Behavioral:  The patient is not nervous/anxious.     Physical Exam Constitutional:      General: She is not in acute distress.    Appearance: She is well-developed. She is obese. She is not diaphoretic.  Neck:     Thyroid: No thyromegaly.  Cardiovascular:     Rate and Rhythm: Normal rate and regular rhythm.     Pulses: Normal pulses.     Heart sounds: Normal heart sounds.  Pulmonary:     Effort: Pulmonary effort is normal. No respiratory distress.     Breath sounds: Normal breath sounds.  Abdominal:     General: Bowel sounds are normal. There is no distension.     Palpations: Abdomen is soft.     Tenderness: There is no abdominal tenderness.  Musculoskeletal:        General: Normal range of motion.     Cervical back: Neck supple.     Right lower leg: No edema.     Left lower leg: No edema.  Lymphadenopathy:     Cervical: No cervical adenopathy.  Skin:    General: Skin is warm and dry.  Neurological:     Mental Status: She is alert. Mental status is at baseline.  Psychiatric:        Mood and Affect: Mood normal.      ASSESSMENT/ PLAN:  TODAY  Bilateral subdural hematomas Depression major single episode mild Type 2 diabetes mellitus with peripheral vascular complications   Will  continue current medications Will continue therapy as directed Will continue to monitor her status.   Time spent with patient 40 minutes: therapy; medications; dietary. 20 minutes spent with advanced directives MOST form filled out.     Synthia Innocent NP [facility] Adult Medicine   call [phone]

## 2022-08-20 ENCOUNTER — Other Ambulatory Visit: Payer: Self-pay | Admitting: *Deleted

## 2022-08-20 NOTE — Patient Outreach (Signed)
Per [name] resides in [place] skilled nursing facility. Screening for potential care coordination services as benefit of health plan and Primary Care Provider.   Update received from [place] Child psychotherapist. Family wants placement in [place], where son lives. [name] reports sending referral to [place] facility. However, no bed offer yet.   Will continue to follow.   Raiford Noble, MSN, RN,BSN [facility] Post Acute Care Coordinator [phone] (Direct dial)

## 2022-09-08 ENCOUNTER — Other Ambulatory Visit: Payer: Self-pay | Admitting: Adult Health

## 2022-09-08 ENCOUNTER — Encounter: Payer: Self-pay | Admitting: Adult Health

## 2022-09-08 ENCOUNTER — Non-Acute Institutional Stay: Payer: Self-pay | Admitting: Adult Health

## 2022-09-08 DIAGNOSIS — F320 Major depressive disorder, single episode, mild: Secondary | ICD-10-CM

## 2022-09-08 DIAGNOSIS — E1151 Type 2 diabetes mellitus with diabetic peripheral angiopathy without gangrene: Secondary | ICD-10-CM

## 2022-09-08 DIAGNOSIS — S065XAA Traumatic subdural hemorrhage with loss of consciousness status unknown, initial encounter: Secondary | ICD-10-CM | POA: Diagnosis not present

## 2022-09-08 DIAGNOSIS — E1165 Type 2 diabetes mellitus with hyperglycemia: Secondary | ICD-10-CM | POA: Diagnosis not present

## 2022-09-08 NOTE — Progress Notes (Signed)
Location:  [facility] Room Number: 129 Place of Service:  SNF (31)   CODE STATUS: dnr   Allergies  Allergen Reactions   Asa [Aspirin]    Diphenhydramine Hcl    Lisinopril     cough   Melatonin    Molds & Smuts    Nsaids    Other    Penicillin G    Penicillins    Tramadol     Chief Complaint  Patient presents with   Discharge Note    HPI:  She is being discharge to SNF. She had been hospitalized for bilateral subdural hematoma. She had been having increased weakness prior to her hospitalization. She was admitted to this facility for short term rehab. Therapy: she had completed therapy. She is unable to maintain being discharged to home. She is moving to another skilled setting to be closer to family.   Past Medical History:  Diagnosis Date   Asthma    Diabetes mellitus without complication (HCC)    Kidney stones 07/2017   required sx   Pulmonary emboli (HCC) 06/2017   Seizures (HCC)    Stroke [facility])     Past Surgical History:  Procedure Laterality Date   ABDOMINAL HYSTERECTOMY     APPENDECTOMY     thumb surgery     TONSILLECTOMY      Social History   Socioeconomic History   Marital status: Widowed    Spouse name: Not on file   Number of children: Not on file   Years of education: Not on file   Highest education level: Not on file  Occupational History   Not on file  Tobacco Use   Smoking status: Never   Smokeless tobacco: Never  Vaping Use   Vaping status: Never Used  Substance and Sexual Activity   Alcohol use: Not Currently   Drug use: Not Currently   Sexual activity: Not Currently  Other Topics Concern   Not on file  Social History Narrative   Lives with daughter and son in law and their 2 kids, also son in law's mother.   Left Handed   Driinks 1-2 cups caffeine weekly   Social Determinants of Health   Financial Resource Strain: Low Risk  (02/18/2022)   Overall Financial Resource Strain (CARDIA)    Difficulty of  Paying Living Expenses: Not hard at all  Food Insecurity: No Food Insecurity (07/14/2022)   Hunger Vital Sign    Worried About Running Out of Food in the Last Year: Never true    Ran Out of Food in the Last Year: Never true  Transportation Needs: No Transportation Needs (07/14/2022)   PRAPARE - Administrator, Civil Service (Medical): No    Lack of Transportation (Non-Medical): No  Physical Activity: Inactive (04/29/2022)   Exercise Vital Sign    Days of Exercise per Week: 0 days    Minutes of Exercise per Session: 0 min  Stress: Stress Concern Present (04/29/2022)   [employer] of Occupational Health - Occupational Stress Questionnaire    Feeling of Stress : To some extent  Social Connections: Moderately Integrated (02/18/2022)   Social Connection and Isolation Panel [NHANES]    Frequency of Communication with Friends and Family: More than three times a week    Frequency of Social Gatherings with Friends and Family: More than three times a week    Attends Religious Services: More than 4 times per year    Active Member of Clubs or  Organizations: Yes    Attends Engineer, structural: More than 4 times per year    Marital Status: Widowed  Intimate Partner Violence: Not At Risk (07/14/2022)   Humiliation, Afraid, Rape, and Kick questionnaire    Fear of Current or Ex-Partner: No    Emotionally Abused: No    Physically Abused: No    Sexually Abused: No   No family history on file.    VITAL SIGNS BP 118/62   Pulse 83   Temp 98.7 F (37.1 C)   Resp 20   Ht 5\' 5"  (1.651 m)   Wt 200 lb 6.4 oz (90.9 kg)   SpO2 91%   BMI 33.35 kg/m   Outpatient Encounter Medications as of 09/08/2022  Medication Sig   acetaminophen (TYLENOL) 500 MG tablet Take 500 mg by mouth every 6 (six) hours as needed.   albuterol (VENTOLIN HFA) 108 (90 Base) MCG/ACT inhaler Inhale into the lungs every 6 (six) hours as needed for wheezing or shortness of breath.   DULoxetine (CYMBALTA) 20 MG  capsule TAKE 1 CAPSULE(20 MG) BY MOUTH DAILY   famotidine (PEPCID) 20 MG tablet TAKE 1 TABLET BY MOUTH AFTER SUPPER   fluticasone (FLONASE) 50 MCG/ACT nasal spray SHAKE LIQUID AND USE 2 SPRAYS IN EACH NOSTRIL DAILY   gabapentin (NEURONTIN) 300 MG capsule TAKE 2 CAPSULES(600 MG) BY MOUTH AT BEDTIME   hydrocortisone (ANUSOL-HC) 25 MG suppository Place 1 suppository (25 mg total) rectally 2 (two) times daily as needed for hemorrhoids or anal itching.   ketoconazole (NIZORAL) 2 % shampoo Apply 1 Application topically 2 (two) times a week.   Ketoconazole-Hydrocortisone 2-2.5 % CREA Apply 1 application  topically daily as needed.   levETIRAcetam (KEPPRA) 500 MG tablet Take 1 tablet (500 mg total) by mouth 2 (two) times daily.   losartan (COZAAR) 25 MG tablet Take 0.5 tablets (12.5 mg total) by mouth daily.   meclizine (ANTIVERT) 25 MG tablet Take 1 tablet (25 mg total) by mouth 3 (three) times daily as needed for dizziness.   metFORMIN (GLUCOPHAGE) 1000 MG tablet TAKE 1 TABLET(1000 MG) BY MOUTH TWICE DAILY WITH A MEAL   montelukast (SINGULAIR) 10 MG tablet TAKE 1 TABLET(10 MG) BY MOUTH AT BEDTIME   pantoprazole (PROTONIX) 40 MG tablet TAKE 1 TABLET(40 MG) BY MOUTH DAILY 30 TO 60 MINUTES BEFORE FIRST MEAL OF THE DAY   polyethylene glycol (MIRALAX / GLYCOLAX) 17 g packet Take 17 g by mouth daily as needed for mild constipation.   simvastatin (ZOCOR) 10 MG tablet Take 1 tablet (10 mg total) by mouth at bedtime.   vitamin E 180 MG (400 UNITS) capsule Take 400 Units by mouth daily.   No facility-administered encounter medications on file as of 09/08/2022.     SIGNIFICANT DIAGNOSTIC EXAMS  Review of Systems  Constitutional:  Negative for malaise/fatigue.  Respiratory:  Negative for cough and shortness of breath.   Cardiovascular:  Negative for chest pain, palpitations and leg swelling.  Gastrointestinal:  Negative for abdominal pain, constipation and heartburn.  Musculoskeletal:  Negative for back  pain, joint pain and myalgias.  Skin: Negative.   Neurological:  Negative for dizziness.  Psychiatric/Behavioral:  The patient is not nervous/anxious.     Physical Exam Constitutional:      General: She is not in acute distress.    Appearance: She is well-developed. She is obese. She is not diaphoretic.  Neck:     Thyroid: No thyromegaly.  Cardiovascular:     Rate and Rhythm:  Normal rate and regular rhythm.     Pulses: Normal pulses.     Heart sounds: Normal heart sounds.  Pulmonary:     Effort: Pulmonary effort is normal. No respiratory distress.     Breath sounds: Normal breath sounds.  Abdominal:     General: Bowel sounds are normal. There is no distension.     Palpations: Abdomen is soft.     Tenderness: There is no abdominal tenderness.  Musculoskeletal:        General: Normal range of motion.     Cervical back: Neck supple.     Right lower leg: No edema.     Left lower leg: No edema.  Lymphadenopathy:     Cervical: No cervical adenopathy.  Skin:    General: Skin is warm and dry.  Neurological:     Mental Status: She is alert. Mental status is at baseline.  Psychiatric:        Mood and Affect: Mood normal.      ASSESSMENT/ PLAN:   Patient is being discharged with the following home health services:  her therapy needs will be provided for at the SNF.   Patient is being discharged with the following durable medical equipment:  none needed   Patient has been advised to f/u with their PCP in 1-2 weeks to for a transitions of care visit.  Social services at their facility was responsible for arranging this appointment.  Pt was provided with adequate prescriptions of noncontrolled medications to reach the scheduled appointment .  For controlled substances, a limited supply was provided as appropriate for the individual patient.  If the pt normally receives these medications from a pain clinic or has a contract with another physician, these medications should be received  from that clinic or physician only).    Her medications and medical follow up will be provided by the SNF   Synthia Innocent NP [facility] Adult Medicine  call [phone]

## 2022-09-14 ENCOUNTER — Encounter: Payer: Medicare Other | Admitting: Licensed Clinical Social Worker

## 2022-09-14 ENCOUNTER — Other Ambulatory Visit: Payer: Self-pay | Admitting: *Deleted

## 2022-09-14 DIAGNOSIS — R569 Unspecified convulsions: Secondary | ICD-10-CM | POA: Diagnosis not present

## 2022-09-14 DIAGNOSIS — N183 Chronic kidney disease, stage 3 unspecified: Secondary | ICD-10-CM | POA: Diagnosis not present

## 2022-09-14 DIAGNOSIS — E1159 Type 2 diabetes mellitus with other circulatory complications: Secondary | ICD-10-CM | POA: Diagnosis not present

## 2022-09-14 DIAGNOSIS — M159 Polyosteoarthritis, unspecified: Secondary | ICD-10-CM | POA: Diagnosis not present

## 2022-09-14 DIAGNOSIS — R488 Other symbolic dysfunctions: Secondary | ICD-10-CM | POA: Diagnosis not present

## 2022-09-14 DIAGNOSIS — K219 Gastro-esophageal reflux disease without esophagitis: Secondary | ICD-10-CM | POA: Diagnosis not present

## 2022-09-14 DIAGNOSIS — R42 Dizziness and giddiness: Secondary | ICD-10-CM | POA: Diagnosis not present

## 2022-09-14 DIAGNOSIS — I6200 Nontraumatic subdural hemorrhage, unspecified: Secondary | ICD-10-CM | POA: Diagnosis not present

## 2022-09-14 DIAGNOSIS — E1151 Type 2 diabetes mellitus with diabetic peripheral angiopathy without gangrene: Secondary | ICD-10-CM | POA: Diagnosis not present

## 2022-09-14 NOTE — Patient Outreach (Addendum)
Post-Acute Care Coordinator follow up.   Collaboration with Melton Alar social worker. Mrs. Jansson transitioned to facility in [place].   No identifiable care coordination needs.   Raiford Noble, MSN, RN,BSN Post Acute Care Coordinator [phone] (Direct dial)

## 2022-09-15 ENCOUNTER — Ambulatory Visit: Payer: Medicare Other | Admitting: Internal Medicine

## 2022-09-15 DIAGNOSIS — E1151 Type 2 diabetes mellitus with diabetic peripheral angiopathy without gangrene: Secondary | ICD-10-CM | POA: Diagnosis not present

## 2022-09-16 ENCOUNTER — Encounter: Payer: Self-pay | Admitting: Internal Medicine

## 2022-09-22 ENCOUNTER — Ambulatory Visit: Payer: Medicare Other | Admitting: Internal Medicine

## 2022-10-14 DIAGNOSIS — R42 Dizziness and giddiness: Secondary | ICD-10-CM | POA: Diagnosis not present

## 2022-10-14 DIAGNOSIS — N183 Chronic kidney disease, stage 3 unspecified: Secondary | ICD-10-CM | POA: Diagnosis not present

## 2022-10-14 DIAGNOSIS — K219 Gastro-esophageal reflux disease without esophagitis: Secondary | ICD-10-CM | POA: Diagnosis not present

## 2022-10-14 DIAGNOSIS — E1159 Type 2 diabetes mellitus with other circulatory complications: Secondary | ICD-10-CM | POA: Diagnosis not present

## 2022-10-14 DIAGNOSIS — I6200 Nontraumatic subdural hemorrhage, unspecified: Secondary | ICD-10-CM | POA: Diagnosis not present

## 2022-10-14 DIAGNOSIS — E1151 Type 2 diabetes mellitus with diabetic peripheral angiopathy without gangrene: Secondary | ICD-10-CM | POA: Diagnosis not present

## 2022-10-14 DIAGNOSIS — M159 Polyosteoarthritis, unspecified: Secondary | ICD-10-CM | POA: Diagnosis not present

## 2022-10-15 DIAGNOSIS — I6200 Nontraumatic subdural hemorrhage, unspecified: Secondary | ICD-10-CM | POA: Diagnosis not present

## 2022-10-15 DIAGNOSIS — R2681 Unsteadiness on feet: Secondary | ICD-10-CM | POA: Diagnosis not present

## 2022-10-15 DIAGNOSIS — M6281 Muscle weakness (generalized): Secondary | ICD-10-CM | POA: Diagnosis not present

## 2022-10-17 DIAGNOSIS — I6200 Nontraumatic subdural hemorrhage, unspecified: Secondary | ICD-10-CM | POA: Diagnosis not present

## 2022-10-17 DIAGNOSIS — R2681 Unsteadiness on feet: Secondary | ICD-10-CM | POA: Diagnosis not present

## 2022-10-17 DIAGNOSIS — M6281 Muscle weakness (generalized): Secondary | ICD-10-CM | POA: Diagnosis not present

## 2022-10-20 DIAGNOSIS — I6200 Nontraumatic subdural hemorrhage, unspecified: Secondary | ICD-10-CM | POA: Diagnosis not present

## 2022-10-20 DIAGNOSIS — R2681 Unsteadiness on feet: Secondary | ICD-10-CM | POA: Diagnosis not present

## 2022-10-20 DIAGNOSIS — M6281 Muscle weakness (generalized): Secondary | ICD-10-CM | POA: Diagnosis not present

## 2022-10-21 DIAGNOSIS — M6281 Muscle weakness (generalized): Secondary | ICD-10-CM | POA: Diagnosis not present

## 2022-10-21 DIAGNOSIS — I6200 Nontraumatic subdural hemorrhage, unspecified: Secondary | ICD-10-CM | POA: Diagnosis not present

## 2022-10-21 DIAGNOSIS — R2681 Unsteadiness on feet: Secondary | ICD-10-CM | POA: Diagnosis not present

## 2022-10-22 DIAGNOSIS — I6200 Nontraumatic subdural hemorrhage, unspecified: Secondary | ICD-10-CM | POA: Diagnosis not present

## 2022-10-22 DIAGNOSIS — R2681 Unsteadiness on feet: Secondary | ICD-10-CM | POA: Diagnosis not present

## 2022-10-22 DIAGNOSIS — M6281 Muscle weakness (generalized): Secondary | ICD-10-CM | POA: Diagnosis not present

## 2022-10-23 DIAGNOSIS — M6281 Muscle weakness (generalized): Secondary | ICD-10-CM | POA: Diagnosis not present

## 2022-10-23 DIAGNOSIS — R2681 Unsteadiness on feet: Secondary | ICD-10-CM | POA: Diagnosis not present

## 2022-10-23 DIAGNOSIS — I6200 Nontraumatic subdural hemorrhage, unspecified: Secondary | ICD-10-CM | POA: Diagnosis not present

## 2022-10-26 DIAGNOSIS — I6200 Nontraumatic subdural hemorrhage, unspecified: Secondary | ICD-10-CM | POA: Diagnosis not present

## 2022-10-26 DIAGNOSIS — M6281 Muscle weakness (generalized): Secondary | ICD-10-CM | POA: Diagnosis not present

## 2022-10-26 DIAGNOSIS — R2681 Unsteadiness on feet: Secondary | ICD-10-CM | POA: Diagnosis not present
